# Patient Record
Sex: Male | Born: 1960 | Race: White | Hispanic: No | Smoking: Never smoker
Health system: Southern US, Community
[De-identification: ages and names within clinical notes are randomized; demographics above are authoritative.]

## PROBLEM LIST (undated history)

## (undated) DIAGNOSIS — Z9889 Other specified postprocedural states: Secondary | ICD-10-CM

## (undated) DIAGNOSIS — F99 Mental disorder, not otherwise specified: Secondary | ICD-10-CM

## (undated) DIAGNOSIS — R42 Dizziness and giddiness: Secondary | ICD-10-CM

## (undated) DIAGNOSIS — F419 Anxiety disorder, unspecified: Secondary | ICD-10-CM

## (undated) DIAGNOSIS — F32A Depression, unspecified: Secondary | ICD-10-CM

## (undated) DIAGNOSIS — M1990 Unspecified osteoarthritis, unspecified site: Secondary | ICD-10-CM

## (undated) DIAGNOSIS — R51 Headache: Secondary | ICD-10-CM

## (undated) DIAGNOSIS — J45909 Unspecified asthma, uncomplicated: Secondary | ICD-10-CM

## (undated) DIAGNOSIS — G629 Polyneuropathy, unspecified: Secondary | ICD-10-CM

## (undated) DIAGNOSIS — T07XXXA Unspecified multiple injuries, initial encounter: Secondary | ICD-10-CM

## (undated) DIAGNOSIS — I7300 Raynaud's syndrome without gangrene: Secondary | ICD-10-CM

## (undated) DIAGNOSIS — R112 Nausea with vomiting, unspecified: Secondary | ICD-10-CM

## (undated) DIAGNOSIS — F329 Major depressive disorder, single episode, unspecified: Secondary | ICD-10-CM

## (undated) HISTORY — DX: Anxiety disorder, unspecified: F41.9

## (undated) HISTORY — PX: JOINT REPLACEMENT: SHX530

---

## 1998-05-10 ENCOUNTER — Encounter: Payer: Self-pay | Admitting: Emergency Medicine

## 1998-05-10 ENCOUNTER — Emergency Department (HOSPITAL_COMMUNITY): Admission: EM | Admit: 1998-05-10 | Discharge: 1998-05-10 | Payer: Self-pay | Admitting: Emergency Medicine

## 1998-05-14 ENCOUNTER — Ambulatory Visit (HOSPITAL_COMMUNITY): Admission: RE | Admit: 1998-05-14 | Discharge: 1998-05-14 | Payer: Self-pay | Admitting: Family Medicine

## 1998-05-17 ENCOUNTER — Ambulatory Visit (HOSPITAL_COMMUNITY): Admission: RE | Admit: 1998-05-17 | Discharge: 1998-05-17 | Payer: Self-pay | Admitting: Family Medicine

## 1998-05-17 ENCOUNTER — Encounter: Payer: Self-pay | Admitting: Family Medicine

## 1998-05-29 ENCOUNTER — Ambulatory Visit (HOSPITAL_COMMUNITY): Admission: RE | Admit: 1998-05-29 | Discharge: 1998-05-29 | Payer: Self-pay | Admitting: Family Medicine

## 1998-05-29 ENCOUNTER — Encounter: Payer: Self-pay | Admitting: Family Medicine

## 1998-06-11 ENCOUNTER — Ambulatory Visit (HOSPITAL_COMMUNITY): Admission: RE | Admit: 1998-06-11 | Discharge: 1998-06-11 | Payer: Self-pay | Admitting: Family Medicine

## 1998-06-11 ENCOUNTER — Encounter: Payer: Self-pay | Admitting: Family Medicine

## 2002-02-19 ENCOUNTER — Emergency Department (HOSPITAL_COMMUNITY): Admission: EM | Admit: 2002-02-19 | Discharge: 2002-02-19 | Payer: Self-pay | Admitting: Emergency Medicine

## 2004-07-16 ENCOUNTER — Ambulatory Visit (HOSPITAL_COMMUNITY): Admission: RE | Admit: 2004-07-16 | Discharge: 2004-07-16 | Payer: Self-pay | Admitting: Gastroenterology

## 2004-07-16 ENCOUNTER — Encounter (INDEPENDENT_AMBULATORY_CARE_PROVIDER_SITE_OTHER): Payer: Self-pay | Admitting: *Deleted

## 2006-11-05 ENCOUNTER — Ambulatory Visit (HOSPITAL_BASED_OUTPATIENT_CLINIC_OR_DEPARTMENT_OTHER): Admission: RE | Admit: 2006-11-05 | Discharge: 2006-11-05 | Payer: Self-pay | Admitting: General Surgery

## 2009-07-11 ENCOUNTER — Other Ambulatory Visit: Payer: Self-pay | Admitting: Orthopedic Surgery

## 2009-07-12 ENCOUNTER — Ambulatory Visit (HOSPITAL_COMMUNITY): Admission: RE | Admit: 2009-07-12 | Discharge: 2009-07-12 | Payer: Self-pay | Admitting: Orthopedic Surgery

## 2010-09-22 LAB — URINALYSIS, ROUTINE W REFLEX MICROSCOPIC
Bilirubin Urine: NEGATIVE
Glucose, UA: NEGATIVE mg/dL
Hgb urine dipstick: NEGATIVE
Ketones, ur: NEGATIVE mg/dL
Nitrite: NEGATIVE
Protein, ur: NEGATIVE mg/dL
Specific Gravity, Urine: 1.011 (ref 1.005–1.030)
Urobilinogen, UA: 0.2 mg/dL (ref 0.0–1.0)
pH: 6.5 (ref 5.0–8.0)

## 2010-09-22 LAB — CBC
HCT: 47.5 % (ref 39.0–52.0)
Hemoglobin: 16.1 g/dL (ref 13.0–17.0)
MCHC: 33.9 g/dL (ref 30.0–36.0)
MCV: 88.9 fL (ref 78.0–100.0)
Platelets: 171 10*3/uL (ref 150–400)
RBC: 5.34 MIL/uL (ref 4.22–5.81)
RDW: 13.2 % (ref 11.5–15.5)
WBC: 4.4 10*3/uL (ref 4.0–10.5)

## 2010-09-22 LAB — COMPREHENSIVE METABOLIC PANEL
ALT: 17 U/L (ref 0–53)
Calcium: 9.2 mg/dL (ref 8.4–10.5)
Glucose, Bld: 100 mg/dL — ABNORMAL HIGH (ref 70–99)
Sodium: 141 mEq/L (ref 135–145)
Total Protein: 6.8 g/dL (ref 6.0–8.3)

## 2010-09-22 LAB — PROTIME-INR
INR: 1 (ref 0.00–1.49)
Prothrombin Time: 13.1 seconds (ref 11.6–15.2)

## 2010-09-22 LAB — POCT HEMOGLOBIN-HEMACUE: Hemoglobin: 17.4 g/dL — ABNORMAL HIGH (ref 13.0–17.0)

## 2010-11-22 NOTE — Op Note (Signed)
NAME:  Johnny [name], Johnny [name]                 ACCOUNT NO.:  [id number]   MEDICAL RECORD NO.:  [id number]          PATIENT TYPE:  AMB   LOCATION:  [facility]                         FACILITY:  [facility]   PHYSICIAN:  Timothy E. Earlene Plater, M.D. DATE OF BIRTH:  October 03, 1960   DATE OF PROCEDURE:  11/05/2006  DATE OF DISCHARGE:                               OPERATIVE REPORT   PREOPERATIVE DIAGNOSIS:  Right inguinal hernia.   POSTOPERATIVE DIAGNOSIS:  Right direct inguinal hernia.   OPERATIVE PROCEDURE:  Repair with mesh.   SURGEON:  Timothy E. Earlene Plater, M.D.   ANESTHESIA:  General.   Johnny [name] has a symptomatic right inguinal hernia and wishes to have  it repaired.  He has been carefully counseled and he is ready to  proceed.  The patient's main risk is obesity.  The patient was seen,  identified, the right groin marked.  He was taken to the operating room,  placed supine.  General endotracheal anesthesia administered.  The groin  was identified on the right side and clipped and prepped and draped in  the usual fashion.  Marcaine 0.25% with epinephrine was used throughout  for field block anesthesia.  An incision was made horizontally over the  palpable defect.  A very thick layer of deep subcutaneous fatty tissue  was encountered.  This was dissected down to the external oblique  fascia.  This was cleared off.  More Marcaine was used.  The external  oblique was opened in line with its fibers through the external ring,  care taken to avoid the ilioinguinal nerve which was preserved.  The  cord structures were dissected off the pubic tubercle and reflected  laterally.  A large direct hernia pouched through the floor of the  inguinal canal.  This was separated from the cord structures and  reduced.  This encompassed the entire floor of the inguinal canal.  A  plug of polypropylene mesh was placed there and sutured to the  surrounding normal fascia.  Then a patch of Proceed mesh was fashioned  to fit the entire  floor and was sewn to the floor with 2-0 Prolene  between the lateral a edge of the inguinal ligament and medially.  This  was accomplished up to and around the internal ring.  The cord  structures passed normally and the tails of the mesh wrapped around the  internal oblique superior to the cord exit.  This was a good repair and  solid.  The cord was replaced in its anatomic position.  The external  oblique was closed with a running 2-0 Vicryl, subcu with 2-0 Vicryl and  skin with 3-0 Monocryl.  All counts correct.  He tolerated it well, was  awakened and taken to the recovery room in good condition.   Written and verbal instructions given to him and his wife along with  Percocet 5 mg #36.  He will be followed in the office.      Timothy E. Earlene Plater, M.D.  Electronically Signed     TED/MEDQ  D:  11/05/2006  T:  11/05/2006  Job:  [id number]

## 2010-11-22 NOTE — Op Note (Signed)
NAME:  Johnny [name], Johnny [name]                 ACCOUNT NO.:  [id number]   MEDICAL RECORD NO.:  [id number]          PATIENT TYPE:  AMB   LOCATION:  ENDO                         FACILITY:  [facility]   PHYSICIAN:  Petra Kuba, M.D.    DATE OF BIRTH:  02-16-1961   DATE OF PROCEDURE:  07/16/2004  DATE OF DISCHARGE:                                 OPERATIVE REPORT   PROCEDURE PERFORMED:  Colonoscopy with biopsy.   INDICATIONS:  The patient with change in bowel habits, increased  constipation.  Consent was signed after risks and benefits, methods and  options thoroughly discussed in the office.   MEDICINES USED:  Demerol 80, Versed 8.   PROCEDURE:  Rectal inspection is pertinent for internal hemorrhoids, small.  Digital exam is negative.  Video colonoscope was inserted and fairly easily  advanced around the colon to the cecum.  This did require some abdominal  pressure but no position changes.  The cecum was identified by the  appendiceal orifice and the ileocecal valve.  On insertion, some left-sided  diverticula were seen but no other abnormalities.  The scope was inserted a  short ways in the terminal ileum which was normal.  Photo documentation was  obtained.  Because of his gastrointestinal infection, possibly picked up in  [place] years ago, we went ahead and took a few terminal ileum biopsies, put  them in the first container and a few random biopsies were obtained and put  in the second container.  On slow withdrawal through the colon, the prep was  adequate.  There was some liquid stool that required washing and suctioning  however, other than the left-sided moderate diverticula, no additional  findings were seen.  Once back in the rectum, anorectal pull through and  retroflexion confirms some small hemorrhoids.  The scope was straightened  and re-advanced a short ways up the left side of the colon.  Air was  suctioned, scope removed.  The patient tolerated the procedure well.  There  was no  obvious immediate complication.   ENDOSCOPIC DIAGNOSES:  1.  Internal and external small hemorrhoids.  2.  Left moderate diverticula.  3.  Otherwise within normal limits to the terminal ileum with random      biopsies throughout there and colon.   PLAN:  Await pathology, follow up p.r.n. or in two months to recheck  symptoms and make sure no further work up plans are needed.  Probably if  constipation recurs, trial of MiraLax and getting laboratory work to rule  out thyroid and other etiologies.      MEM/MEDQ  D:  07/16/2004  T:  07/16/2004  Job:  [id number]   cc:   Molly Maduro A. Nicholos Johns, M.D.  [address]  [place]  [place] [address]  Fax: [phone]

## 2011-01-29 ENCOUNTER — Emergency Department (HOSPITAL_COMMUNITY): Payer: BC Managed Care – PPO

## 2011-01-29 ENCOUNTER — Emergency Department (HOSPITAL_COMMUNITY)
Admission: EM | Admit: 2011-01-29 | Discharge: 2011-01-30 | Disposition: A | Discharge status: Home / Self Care | Payer: BC Managed Care – PPO | Attending: Emergency Medicine | Admitting: Emergency Medicine

## 2011-01-29 DIAGNOSIS — K2990 Gastroduodenitis, unspecified, without bleeding: Secondary | ICD-10-CM | POA: Insufficient documentation

## 2011-01-29 DIAGNOSIS — K2970 Gastritis, unspecified, without bleeding: Secondary | ICD-10-CM | POA: Insufficient documentation

## 2011-01-29 DIAGNOSIS — R10819 Abdominal tenderness, unspecified site: Secondary | ICD-10-CM | POA: Insufficient documentation

## 2011-01-29 DIAGNOSIS — R079 Chest pain, unspecified: Secondary | ICD-10-CM | POA: Insufficient documentation

## 2011-01-29 LAB — CBC
Hemoglobin: 16.1 g/dL (ref 13.0–17.0)
RBC: 5.62 MIL/uL (ref 4.22–5.81)
WBC: 4.7 10*3/uL (ref 4.0–10.5)

## 2011-01-29 LAB — LIPASE, BLOOD: Lipase: 22 U/L (ref 11–59)

## 2011-01-29 LAB — CK TOTAL AND CKMB (NOT AT ARMC)
CK, MB: 1.6 ng/mL (ref 0.3–4.0)
Relative Index: 1.5 (ref 0.0–2.5)

## 2011-01-29 LAB — COMPREHENSIVE METABOLIC PANEL
ALT: 17 U/L (ref 0–53)
Alkaline Phosphatase: 51 U/L (ref 39–117)
CO2: 23 mEq/L (ref 19–32)
Chloride: 95 mEq/L — ABNORMAL LOW (ref 96–112)
GFR calc Af Amer: >60 mL/min (ref >60)
GFR calc non Af Amer: >60 mL/min (ref >60)
Glucose, Bld: 105 mg/dL — ABNORMAL HIGH (ref 70–99)
Potassium: 3.5 mEq/L (ref 3.5–5.1)
Sodium: 130 mEq/L — ABNORMAL LOW (ref 135–145)

## 2011-01-29 LAB — DIFFERENTIAL
Basophils Absolute: 0 10*3/uL (ref 0.0–0.1)
Basophils Relative: 0 % (ref 0–1)
Monocytes Relative: 10 % (ref 3–12)
Neutro Abs: 3.1 10*3/uL (ref 1.7–7.7)
Neutrophils Relative %: 66 % (ref 43–77)

## 2011-01-29 LAB — TROPONIN I: Troponin I: <0.3 ng/mL (ref <0.30)

## 2011-07-07 ENCOUNTER — Other Ambulatory Visit: Payer: Self-pay | Admitting: Dermatology

## 2012-02-05 ENCOUNTER — Other Ambulatory Visit: Payer: Self-pay | Admitting: Orthopedic Surgery

## 2012-02-05 MED ORDER — BUPIVACAINE 0.25 % ON-Q PUMP SINGLE CATH 300ML: 300.0000 mL | INJECTION | Status: DC

## 2012-02-05 MED ORDER — DEXAMETHASONE SODIUM PHOSPHATE 10 MG/ML IJ SOLN: 10.0000 mg | Freq: Once | INTRAMUSCULAR | Status: DC

## 2012-02-05 NOTE — Progress Notes (Signed)
Preoperative surgical orders have been place into the Epic hospital system for Johnny [name] on 02/05/2012, 9:58 PM  by Patrica Duel for surgery on 02/20/12.  Preop Total Knee orders including Bupivacaine On-Q pump, IV Tylenol, and IV Decadron as long as there are no contraindications to the above medications. [name], PA-C

## 2012-02-06 ENCOUNTER — Encounter (HOSPITAL_COMMUNITY): Payer: Self-pay | Admitting: Pharmacy Technician

## 2012-02-09 ENCOUNTER — Encounter (HOSPITAL_COMMUNITY)
Admission: RE | Admit: 2012-02-09 | Discharge: 2012-02-09 | Disposition: A | Discharge status: Home / Self Care | Payer: BC Managed Care – PPO | Source: Ambulatory Visit | Attending: Orthopedic Surgery | Admitting: Orthopedic Surgery

## 2012-02-09 ENCOUNTER — Encounter (HOSPITAL_COMMUNITY): Payer: Self-pay

## 2012-02-09 DIAGNOSIS — R42 Dizziness and giddiness: Secondary | ICD-10-CM

## 2012-02-09 DIAGNOSIS — M1990 Unspecified osteoarthritis, unspecified site: Secondary | ICD-10-CM

## 2012-02-09 DIAGNOSIS — F99 Mental disorder, not otherwise specified: Secondary | ICD-10-CM

## 2012-02-09 DIAGNOSIS — I7300 Raynaud's syndrome without gangrene: Secondary | ICD-10-CM

## 2012-02-09 DIAGNOSIS — T07XXXA Unspecified multiple injuries, initial encounter: Secondary | ICD-10-CM

## 2012-02-09 DIAGNOSIS — J45909 Unspecified asthma, uncomplicated: Secondary | ICD-10-CM

## 2012-02-09 HISTORY — DX: Unspecified osteoarthritis, unspecified site: M19.90

## 2012-02-09 HISTORY — DX: Raynaud's syndrome without gangrene: I73.00

## 2012-02-09 HISTORY — DX: Major depressive disorder, single episode, unspecified: F32.9

## 2012-02-09 HISTORY — PX: HERNIA REPAIR: SHX51

## 2012-02-09 HISTORY — DX: Depression, unspecified: F32.A

## 2012-02-09 HISTORY — DX: Dizziness and giddiness: R42

## 2012-02-09 HISTORY — DX: Unspecified multiple injuries, initial encounter: T07.XXXA

## 2012-02-09 HISTORY — PX: GANGLION CYST EXCISION: SHX1691

## 2012-02-09 HISTORY — DX: Mental disorder, not otherwise specified: F99

## 2012-02-09 HISTORY — PX: KNEE ARTHROSCOPY: SHX127

## 2012-02-09 HISTORY — DX: Unspecified asthma, uncomplicated: J45.909

## 2012-02-09 LAB — COMPREHENSIVE METABOLIC PANEL
AST: 14 U/L (ref 0–37)
Albumin: 4.1 g/dL (ref 3.5–5.2)
Chloride: 97 mEq/L (ref 96–112)
Creatinine, Ser: 0.97 mg/dL (ref 0.50–1.35)
Potassium: 3.5 mEq/L (ref 3.5–5.1)
Total Bilirubin: 0.3 mg/dL (ref 0.3–1.2)

## 2012-02-09 LAB — URINALYSIS, ROUTINE W REFLEX MICROSCOPIC
Bilirubin Urine: NEGATIVE
Leukocytes, UA: NEGATIVE
Nitrite: NEGATIVE
Specific Gravity, Urine: 1.027 (ref 1.005–1.030)
pH: 6 (ref 5.0–8.0)

## 2012-02-09 LAB — PROTIME-INR: INR: 1 (ref 0.00–1.49)

## 2012-02-09 LAB — CBC
MCV: 85.5 fL (ref 78.0–100.0)
Platelets: 167 10*3/uL (ref 150–400)
RDW: 13 % (ref 11.5–15.5)
WBC: 3.9 10*3/uL — ABNORMAL LOW (ref 4.0–10.5)

## 2012-02-09 LAB — APTT: aPTT: 36 seconds (ref 24–37)

## 2012-02-09 NOTE — Patient Instructions (Addendum)
20 RAINEY KAHRS  02/09/2012   Your procedure is scheduled on:  8-16 -2013  Report to [facility] at     1000   AM               Call this number if you have problems the morning of surgery: [phone] or Presurgical Testing [phone] prior to   Remember:   Do not eat food:After Midnight.  May have clear liquids:up to 6 Hours before arrival. Nothing after : 0600  Clear liquids include soda, tea, black coffee, apple or grape juice, broth.  Take these medicines the morning of surgery with A SIP OF WATER: Clonazepam, Lamictal, Trileptal, Setraline   Do not wear jewelry, make-up or nail polish.  Do not wear lotions, powders, or perfumes. You may wear deodorant.  Do not shave 48 hours prior to surgery.(face and neck okay, no shaving of legs)  Do not bring valuables to the hospital.  Contacts, dentures or bridgework may not be worn into surgery.  Leave suitcase in the car. After surgery it may be brought to your room.  For patients admitted to the hospital, checkout time is 11:00 AM the day of discharge.   Patients discharged the day of surgery will not be allowed to drive home.  Name and phone number of your driver: spouse  Special Instructions: CHG Shower Use Special Wash: 1/2 bottle night before surgery and 1/2 bottle morning of surgery.(avoid face and genitals)   Please read over the following fact sheets that you were given: MRSA Information, Blood Transfusion fact sheet, Incentive Spirometry Instruction.

## 2012-02-09 NOTE — Pre-Procedure Instructions (Signed)
02-09-12 Teach back method used. EKG done today

## 2012-02-11 ENCOUNTER — Other Ambulatory Visit (HOSPITAL_COMMUNITY): Payer: BC Managed Care – PPO

## 2012-02-15 ENCOUNTER — Other Ambulatory Visit: Payer: Self-pay | Admitting: Orthopedic Surgery

## 2012-02-15 NOTE — H&P (Signed)
Johnny [name]  DOB: 1960/10/19 Married / Language: English / Race: White / Male  Date of Admission:  02/20/2012  Chief complaint:  Right Knee Pain  History of Present Illness The patient is a 51 year old male who comes in for a preoperative History and Physical. The patient is scheduled for a right total knee arthroplasty to be performed by Dr. Gus Rankin. [name], MD at [facility] on 02/20/2012. The patient is a 51 year old male who presents today for follow up of their knee. The patient is being followed for their right knee pain and osteoarthritis. The patient has not gotten any relief of their symptoms with viscosupplementation. Note for "Follow-up Knee": His knee is getting worse, eventhough he has been icing it twice a day. The Synvisc series did not help him. The continues to buckle on him. [name] is at a stage now where the knee is hurting him at all times. It is limiting what he can and cannot do. He is at a stage where he wants to get this fixed. He is tired of the pain and dysfunction. He does have pain at night also. They have been treated conservatively in the past for the above stated problem and despite conservative measures, they continue to have progressive pain and severe functional limitations and dysfunction. They have failed non-operative management. It is felt that they would benefit from undergoing total joint replacement. Risks and benefits of the procedure have been discussed with the patient and they elect to proceed with surgery. There are no active contraindications to surgery such as ongoing infection or rapidly progressive neurological disease.   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 Type 2 Depression. History of Chronic Bronchitis. History Of Pneumonia.  History Of Vertigo Alcoholism Measles Mumps   Allergies No Known Drug Allergies. 09/18/2011   Family History Depression. mother Hypertension. mother Other medical problems. Son [name] has Asperger syndrome and mild MR Father. Deceased, Lung Cancer.   Social History Drug/Alcohol Rehab (Currently). no Alcohol use. former drinker Marital status. married Living situation. live with spouse Children. 2 Current work status. working full time Previously in rehab. yes Pain Contract. no Exercise. Exercises rarely; does running / walking Illicit drug use. yes Tobacco use. 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 greater than 5 Tobacco / smoke exposure. no Advance Directives. Healthcare POA Post-Surgical Plans. Plan is to go home.   Medication History ALPRAZolam (0.5MG  Tablet, Oral) Active. LamoTRIgine (150MG  Tablet, Oral) Active. ClonazePAM (0.5MG  Tablet, Oral) Active. SEROquel XR (300MG  Tablet ER 24HR, Oral) Active. Sertraline HCl (50MG  Tablet, Oral) Active. Vitamin D (Ergocalciferol) (50000UNIT Capsule, Oral) Active.   Past Surgical History Inguinal Hernia Repair. open: right   Vitals Weight: 280 lb Height: 70.5 in Body Surface Area: 2.51 m Body Mass Index: 39.61 kg/m Pulse: 76 (Regular) Resp.: 12 (Unlabored) BP: 124/72 (Sitting, Right Arm, Standard)    Physical Exam The physical exam findings are as follows:   General Mental Status - Alert, cooperative and good historian. General Appearance- pleasant. Not in acute distress. Orientation- Oriented X3. Build & Nutrition- Well nourished and Well developed.   Head and Neck Head- normocephalic, atraumatic . Neck Global Assessment- supple. no bruit auscultated on the right and no bruit auscultated on the left.   Eye Pupil- Bilateral- Regular and  Round. Motion- Bilateral-  EOMI. wears glasses  Chest and Lung Exam Auscultation: Breath sounds:- clear at anterior chest wall and - clear at posterior chest wall. Adventitious sounds:- No Adventitious sounds.   Cardiovascular Auscultation:Rhythm- Regular rate and rhythm. Heart Sounds- S1 WNL and S2 WNL. Murmurs & Other Heart Sounds:Auscultation of the heart reveals - No Murmurs.   Abdomen Inspection:Contour- Generalized moderate distention. Palpation/Percussion:Tenderness- Abdomen is non-tender to palpation. Rigidity (guarding)- Abdomen is soft. Auscultation:Auscultation of the abdomen reveals - Bowel sounds normal.   Male Genitourinary Not done, not pertinent to present illness  Musculoskeletal On exam, he is alert and oriented in no apparent distress. His right knee shows very mild swelling. His range is about 5 to 120 with marked crepitus on range of motion. Tenderness medial greater than lateral with no instability.  Assessment & Plan Osteoarthritis, Knee (715.96) Impression: Right Knee  Note: Patient is for a Right Total Knee Repalcement by Dr. Lequita Halt.  Plan is to go home.  Dr. Alvino Chapel  Signed electronically by Roberts Gaudy, PA-C

## 2012-02-20 ENCOUNTER — Encounter (HOSPITAL_COMMUNITY): Payer: Self-pay | Admitting: Anesthesiology

## 2012-02-20 ENCOUNTER — Inpatient Hospital Stay (HOSPITAL_COMMUNITY)
Admission: RE | Admit: 2012-02-20 | Discharge: 2012-02-23 | DRG: 209 | Disposition: A | Discharge status: Home Health | Payer: BC Managed Care – PPO | Source: Ambulatory Visit | Attending: Orthopedic Surgery | Admitting: Orthopedic Surgery

## 2012-02-20 ENCOUNTER — Ambulatory Visit (HOSPITAL_COMMUNITY): Payer: BC Managed Care – PPO | Admitting: Anesthesiology

## 2012-02-20 ENCOUNTER — Encounter (HOSPITAL_COMMUNITY): Payer: Self-pay | Admitting: *Deleted

## 2012-02-20 ENCOUNTER — Encounter (HOSPITAL_COMMUNITY)
Admission: RE | Disposition: A | Discharge status: Home Health | Payer: Self-pay | Source: Ambulatory Visit | Attending: Orthopedic Surgery

## 2012-02-20 DIAGNOSIS — F411 Generalized anxiety disorder: Secondary | ICD-10-CM | POA: Diagnosis present

## 2012-02-20 DIAGNOSIS — F3189 Other bipolar disorder: Secondary | ICD-10-CM | POA: Diagnosis present

## 2012-02-20 DIAGNOSIS — M1710 Unilateral primary osteoarthritis, unspecified knee: Secondary | ICD-10-CM | POA: Diagnosis present

## 2012-02-20 DIAGNOSIS — I739 Peripheral vascular disease, unspecified: Secondary | ICD-10-CM | POA: Diagnosis present

## 2012-02-20 DIAGNOSIS — Z6841 Body Mass Index (BMI) 40.0 and over, adult: Secondary | ICD-10-CM

## 2012-02-20 DIAGNOSIS — I7300 Raynaud's syndrome without gangrene: Secondary | ICD-10-CM | POA: Diagnosis present

## 2012-02-20 DIAGNOSIS — Z01810 Encounter for preprocedural cardiovascular examination: Secondary | ICD-10-CM

## 2012-02-20 DIAGNOSIS — IMO0002 Reserved for concepts with insufficient information to code with codable children: Principal | ICD-10-CM | POA: Diagnosis present

## 2012-02-20 DIAGNOSIS — K219 Gastro-esophageal reflux disease without esophagitis: Secondary | ICD-10-CM | POA: Diagnosis present

## 2012-02-20 DIAGNOSIS — Z01812 Encounter for preprocedural laboratory examination: Secondary | ICD-10-CM

## 2012-02-20 DIAGNOSIS — M25569 Pain in unspecified knee: Secondary | ICD-10-CM | POA: Diagnosis present

## 2012-02-20 HISTORY — PX: TOTAL KNEE ARTHROPLASTY: SHX125

## 2012-02-20 LAB — TYPE AND SCREEN: ABO/RH(D): A POS

## 2012-02-20 LAB — ABO/RH: ABO/RH(D): A POS

## 2012-02-20 SURGERY — ARTHROPLASTY, KNEE, TOTAL
Anesthesia: Spinal | Site: Knee | Laterality: Right | Wound class: Clean

## 2012-02-20 MED ORDER — BUPIVACAINE ON-Q PAIN PUMP (FOR ORDER SET NO CHG)
INJECTION | Status: DC
  Filled 2012-02-20: qty 1

## 2012-02-20 MED ORDER — ONDANSETRON HCL 4 MG PO TABS: 4.0000 mg | ORAL_TABLET | Freq: Four times a day (QID) | ORAL | Status: DC | PRN

## 2012-02-20 MED ORDER — SODIUM CHLORIDE 0.9 % IR SOLN
Status: DC | PRN
  Administered 2012-02-20: 1000 mL

## 2012-02-20 MED ORDER — ONDANSETRON HCL 4 MG/2ML IJ SOLN: 4.0000 mg | Freq: Four times a day (QID) | INTRAMUSCULAR | Status: DC | PRN

## 2012-02-20 MED ORDER — POLYETHYLENE GLYCOL 3350 17 G PO PACK: 17.0000 g | PACK | Freq: Every day | ORAL | Status: DC | PRN

## 2012-02-20 MED ORDER — PROMETHAZINE HCL 25 MG/ML IJ SOLN: 6.2500 mg | INTRAMUSCULAR | Status: DC | PRN

## 2012-02-20 MED ORDER — LAMOTRIGINE 150 MG PO TABS
150.0000 mg | ORAL_TABLET | Freq: Four times a day (QID) | ORAL | Status: DC
  Administered 2012-02-20 – 2012-02-23 (×11): 150 mg via ORAL
  Filled 2012-02-20 (×16): qty 1

## 2012-02-20 MED ORDER — PROPOFOL 10 MG/ML IV EMUL
INTRAVENOUS | Status: DC | PRN
  Administered 2012-02-20: 140 ug/kg/min via INTRAVENOUS

## 2012-02-20 MED ORDER — BUPIVACAINE 0.25 % ON-Q PUMP SINGLE CATH 300ML
INJECTION | Status: AC
  Filled 2012-02-20: qty 300

## 2012-02-20 MED ORDER — CEFAZOLIN SODIUM 1-5 GM-% IV SOLN
1.0000 g | Freq: Four times a day (QID) | INTRAVENOUS | Status: AC
  Administered 2012-02-20 – 2012-02-21 (×2): 1 g via INTRAVENOUS
  Filled 2012-02-20 (×2): qty 50

## 2012-02-20 MED ORDER — CLONAZEPAM 0.5 MG PO TABS: 0.5000 mg | ORAL_TABLET | ORAL | Status: DC

## 2012-02-20 MED ORDER — SERTRALINE HCL 50 MG PO TABS
50.0000 mg | ORAL_TABLET | Freq: Every day | ORAL | Status: DC
  Administered 2012-02-21: 50 mg via ORAL
  Filled 2012-02-20 (×2): qty 1

## 2012-02-20 MED ORDER — OXCARBAZEPINE 300 MG PO TABS
450.0000 mg | ORAL_TABLET | Freq: Two times a day (BID) | ORAL | Status: DC
  Administered 2012-02-20: 450 mg via ORAL
  Filled 2012-02-20 (×3): qty 1

## 2012-02-20 MED ORDER — SODIUM CHLORIDE 0.9 % IV SOLN: INTRAVENOUS | Status: DC

## 2012-02-20 MED ORDER — METHOCARBAMOL 100 MG/ML IJ SOLN
500.0000 mg | Freq: Four times a day (QID) | INTRAVENOUS | Status: DC | PRN
  Administered 2012-02-20 (×2): 500 mg via INTRAVENOUS
  Filled 2012-02-20 (×2): qty 5

## 2012-02-20 MED ORDER — CEFAZOLIN SODIUM-DEXTROSE 2-3 GM-% IV SOLR
INTRAVENOUS | Status: AC
  Filled 2012-02-20: qty 50

## 2012-02-20 MED ORDER — DOCUSATE SODIUM 100 MG PO CAPS
100.0000 mg | ORAL_CAPSULE | Freq: Two times a day (BID) | ORAL | Status: DC
  Administered 2012-02-20 – 2012-02-23 (×6): 100 mg via ORAL

## 2012-02-20 MED ORDER — BUPIVACAINE IN DEXTROSE 0.75-8.25 % IT SOLN
INTRATHECAL | Status: DC | PRN
  Administered 2012-02-20: 1.8 mL via INTRATHECAL

## 2012-02-20 MED ORDER — ACETAMINOPHEN 325 MG PO TABS
650.0000 mg | ORAL_TABLET | Freq: Four times a day (QID) | ORAL | Status: DC | PRN
  Administered 2012-02-21: 650 mg via ORAL
  Filled 2012-02-20: qty 2

## 2012-02-20 MED ORDER — DIPHENHYDRAMINE HCL 50 MG/ML IJ SOLN: 12.5000 mg | Freq: Four times a day (QID) | INTRAMUSCULAR | Status: DC | PRN

## 2012-02-20 MED ORDER — 0.9 % SODIUM CHLORIDE (POUR BTL) OPTIME
TOPICAL | Status: DC | PRN
  Administered 2012-02-20: 1000 mL

## 2012-02-20 MED ORDER — BUPIVACAINE 0.25 % ON-Q PUMP SINGLE CATH 100 ML
INJECTION | Status: DC | PRN
  Administered 2012-02-20: 300 mL

## 2012-02-20 MED ORDER — ACETAMINOPHEN 10 MG/ML IV SOLN
INTRAVENOUS | Status: AC
  Filled 2012-02-20: qty 100

## 2012-02-20 MED ORDER — DEXTROSE 5 % IV SOLN
3.0000 g | INTRAVENOUS | Status: AC
  Administered 2012-02-20: 3 g via INTRAVENOUS

## 2012-02-20 MED ORDER — BISACODYL 10 MG RE SUPP: 10.0000 mg | Freq: Every day | RECTAL | Status: DC | PRN

## 2012-02-20 MED ORDER — ACETAMINOPHEN 10 MG/ML IV SOLN
INTRAVENOUS | Status: DC | PRN
  Administered 2012-02-20: 1000 mg via INTRAVENOUS

## 2012-02-20 MED ORDER — PROPOFOL 10 MG/ML IV BOLUS
INTRAVENOUS | Status: DC | PRN
  Administered 2012-02-20: 40 mg via INTRAVENOUS

## 2012-02-20 MED ORDER — FLEET ENEMA 7-19 GM/118ML RE ENEM: 1.0000 | ENEMA | Freq: Once | RECTAL | Status: AC | PRN

## 2012-02-20 MED ORDER — ACETAMINOPHEN 650 MG RE SUPP: 650.0000 mg | Freq: Four times a day (QID) | RECTAL | Status: DC | PRN

## 2012-02-20 MED ORDER — MENTHOL 3 MG MT LOZG
1.0000 | LOZENGE | OROMUCOSAL | Status: DC | PRN
  Filled 2012-02-20: qty 9

## 2012-02-20 MED ORDER — MORPHINE SULFATE (PF) 1 MG/ML IV SOLN
INTRAVENOUS | Status: DC
  Administered 2012-02-20: 1 mg via INTRAVENOUS
  Administered 2012-02-20: 27 mg via INTRAVENOUS
  Administered 2012-02-21: 19 mg via INTRAVENOUS
  Administered 2012-02-21: 11 mg via INTRAVENOUS
  Filled 2012-02-20 (×2): qty 25

## 2012-02-20 MED ORDER — FENTANYL CITRATE 0.05 MG/ML IJ SOLN
INTRAMUSCULAR | Status: DC | PRN
  Administered 2012-02-20: 100 ug via INTRAVENOUS

## 2012-02-20 MED ORDER — RIVAROXABAN 10 MG PO TABS
10.0000 mg | ORAL_TABLET | Freq: Every day | ORAL | Status: DC
  Administered 2012-02-21 – 2012-02-23 (×3): 10 mg via ORAL
  Filled 2012-02-20 (×4): qty 1

## 2012-02-20 MED ORDER — DIPHENHYDRAMINE HCL 12.5 MG/5ML PO ELIX: 12.5000 mg | ORAL_SOLUTION | Freq: Four times a day (QID) | ORAL | Status: DC | PRN

## 2012-02-20 MED ORDER — HYDROMORPHONE HCL PF 1 MG/ML IJ SOLN: 0.2500 mg | INTRAMUSCULAR | Status: DC | PRN

## 2012-02-20 MED ORDER — POTASSIUM CHLORIDE IN NACL 20-0.9 MEQ/L-% IV SOLN
INTRAVENOUS | Status: DC
  Administered 2012-02-20 – 2012-02-22 (×3): via INTRAVENOUS
  Filled 2012-02-20 (×8): qty 1000

## 2012-02-20 MED ORDER — METOCLOPRAMIDE HCL 10 MG PO TABS: 5.0000 mg | ORAL_TABLET | Freq: Three times a day (TID) | ORAL | Status: DC | PRN

## 2012-02-20 MED ORDER — MIDAZOLAM HCL 5 MG/5ML IJ SOLN
INTRAMUSCULAR | Status: DC | PRN
  Administered 2012-02-20: 2 mg via INTRAVENOUS

## 2012-02-20 MED ORDER — MORPHINE SULFATE (PF) 1 MG/ML IV SOLN
INTRAVENOUS | Status: AC
  Administered 2012-02-20: 1 mg via INTRAVENOUS
  Filled 2012-02-20: qty 25

## 2012-02-20 MED ORDER — SODIUM CHLORIDE 0.9 % IJ SOLN: 9.0000 mL | INTRAMUSCULAR | Status: DC | PRN

## 2012-02-20 MED ORDER — ACETAMINOPHEN 10 MG/ML IV SOLN
1000.0000 mg | Freq: Four times a day (QID) | INTRAVENOUS | Status: AC
  Administered 2012-02-20 – 2012-02-21 (×4): 1000 mg via INTRAVENOUS
  Filled 2012-02-20 (×5): qty 100

## 2012-02-20 MED ORDER — CEFAZOLIN SODIUM 1-5 GM-% IV SOLN
INTRAVENOUS | Status: AC
  Filled 2012-02-20: qty 50

## 2012-02-20 MED ORDER — OXYCODONE HCL 5 MG PO TABS
5.0000 mg | ORAL_TABLET | ORAL | Status: DC | PRN
  Administered 2012-02-20 – 2012-02-22 (×6): 10 mg via ORAL
  Administered 2012-02-22: 5 mg via ORAL
  Administered 2012-02-22 – 2012-02-23 (×3): 10 mg via ORAL
  Administered 2012-02-23: 5 mg via ORAL
  Filled 2012-02-20 (×2): qty 2
  Filled 2012-02-20: qty 1
  Filled 2012-02-20 (×8): qty 2

## 2012-02-20 MED ORDER — DIPHENHYDRAMINE HCL 12.5 MG/5ML PO ELIX: 12.5000 mg | ORAL_SOLUTION | ORAL | Status: DC | PRN

## 2012-02-20 MED ORDER — QUETIAPINE FUMARATE ER 300 MG PO TB24
600.0000 mg | ORAL_TABLET | Freq: Every day | ORAL | Status: DC
  Administered 2012-02-20 – 2012-02-22 (×3): 600 mg via ORAL
  Filled 2012-02-20 (×4): qty 2

## 2012-02-20 MED ORDER — ACETAMINOPHEN 10 MG/ML IV SOLN: 1000.0000 mg | Freq: Once | INTRAVENOUS | Status: DC

## 2012-02-20 MED ORDER — LACTATED RINGERS IV SOLN
INTRAVENOUS | Status: DC
  Administered 2012-02-20: 14:00:00 via INTRAVENOUS
  Administered 2012-02-20: 1000 mL via INTRAVENOUS
  Administered 2012-02-20: 13:00:00 via INTRAVENOUS

## 2012-02-20 MED ORDER — CLONAZEPAM 0.5 MG PO TABS
0.5000 mg | ORAL_TABLET | Freq: Every day | ORAL | Status: DC
  Administered 2012-02-21: 0.5 mg via ORAL
  Filled 2012-02-20: qty 1

## 2012-02-20 MED ORDER — CLONAZEPAM 1 MG PO TABS
1.0000 mg | ORAL_TABLET | Freq: Every day | ORAL | Status: DC
  Administered 2012-02-20: 1 mg via ORAL
  Filled 2012-02-20: qty 1

## 2012-02-20 MED ORDER — PHENOL 1.4 % MT LIQD
1.0000 | OROMUCOSAL | Status: DC | PRN
  Filled 2012-02-20: qty 177

## 2012-02-20 MED ORDER — METHOCARBAMOL 500 MG PO TABS
500.0000 mg | ORAL_TABLET | Freq: Four times a day (QID) | ORAL | Status: DC | PRN
  Administered 2012-02-21 – 2012-02-22 (×3): 500 mg via ORAL
  Filled 2012-02-20 (×4): qty 1

## 2012-02-20 MED ORDER — NALOXONE HCL 0.4 MG/ML IJ SOLN: 0.4000 mg | INTRAMUSCULAR | Status: DC | PRN

## 2012-02-20 MED ORDER — METOCLOPRAMIDE HCL 5 MG/ML IJ SOLN: 5.0000 mg | Freq: Three times a day (TID) | INTRAMUSCULAR | Status: DC | PRN

## 2012-02-20 MED ORDER — CHLORHEXIDINE GLUCONATE 4 % EX LIQD: 60.0000 mL | Freq: Once | CUTANEOUS | Status: DC

## 2012-02-20 SURGICAL SUPPLY — 51 items
BAG ZIPLOCK 12X15 (MISCELLANEOUS) ×2 IMPLANT
BANDAGE ELASTIC 6 VELCRO ST LF (GAUZE/BANDAGES/DRESSINGS) ×2 IMPLANT
BANDAGE ESMARK 6X9 LF (GAUZE/BANDAGES/DRESSINGS) ×1 IMPLANT
BLADE SAG 18X100X1.27 (BLADE) ×2 IMPLANT
BLADE SAW SGTL 11.0X1.19X90.0M (BLADE) ×2 IMPLANT
BNDG ESMARK 6X9 LF (GAUZE/BANDAGES/DRESSINGS) ×2
BOWL SMART MIX CTS (DISPOSABLE) ×2 IMPLANT
CATH KIT ON-Q SILVERSOAK 5IN (CATHETERS) ×2 IMPLANT
CEMENT HV SMART SET (Cement) ×4 IMPLANT
CLOTH BEACON ORANGE TIMEOUT ST (SAFETY) ×2 IMPLANT
CUFF TOURN SGL QUICK 34 (TOURNIQUET CUFF) ×1
CUFF TRNQT CYL 34X4X40X1 (TOURNIQUET CUFF) ×1 IMPLANT
DRAPE EXTREMITY T 121X128X90 (DRAPE) ×2 IMPLANT
DRAPE POUCH INSTRU U-SHP 10X18 (DRAPES) ×2 IMPLANT
DRAPE U-SHAPE 47X51 STRL (DRAPES) ×2 IMPLANT
DRSG ADAPTIC 3X8 NADH LF (GAUZE/BANDAGES/DRESSINGS) ×2 IMPLANT
DRSG PAD ABDOMINAL 8X10 ST (GAUZE/BANDAGES/DRESSINGS) ×2 IMPLANT
DURAPREP 26ML APPLICATOR (WOUND CARE) ×2 IMPLANT
ELECT REM PT RETURN 9FT ADLT (ELECTROSURGICAL) ×2
ELECTRODE REM PT RTRN 9FT ADLT (ELECTROSURGICAL) ×1 IMPLANT
EVACUATOR 1/8 PVC DRAIN (DRAIN) ×2 IMPLANT
FACESHIELD LNG OPTICON STERILE (SAFETY) ×10 IMPLANT
GLOVE BIO SURGEON STRL SZ7.5 (GLOVE) IMPLANT
GLOVE BIO SURGEON STRL SZ8 (GLOVE) ×2 IMPLANT
GLOVE BIOGEL PI IND STRL 8 (GLOVE) ×2 IMPLANT
GLOVE BIOGEL PI INDICATOR 8 (GLOVE) ×2
GOWN STRL NON-REIN LRG LVL3 (GOWN DISPOSABLE) ×6 IMPLANT
GOWN STRL REIN XL XLG (GOWN DISPOSABLE) ×2 IMPLANT
HANDPIECE INTERPULSE COAX TIP (DISPOSABLE) ×1
IMMOBILIZER KNEE 20 (SOFTGOODS) ×2
IMMOBILIZER KNEE 20 THIGH 36 (SOFTGOODS) ×1 IMPLANT
KIT BASIN OR (CUSTOM PROCEDURE TRAY) ×2 IMPLANT
MANIFOLD NEPTUNE II (INSTRUMENTS) ×2 IMPLANT
NS IRRIG 1000ML POUR BTL (IV SOLUTION) ×2 IMPLANT
PACK TOTAL JOINT (CUSTOM PROCEDURE TRAY) ×2 IMPLANT
PAD ABD 7.5X8 STRL (GAUZE/BANDAGES/DRESSINGS) ×2 IMPLANT
PADDING CAST COTTON 6X4 STRL (CAST SUPPLIES) ×2 IMPLANT
POSITIONER SURGICAL ARM (MISCELLANEOUS) ×2 IMPLANT
SET HNDPC FAN SPRY TIP SCT (DISPOSABLE) ×1 IMPLANT
SPONGE GAUZE 4X4 12PLY (GAUZE/BANDAGES/DRESSINGS) ×2 IMPLANT
STRIP CLOSURE SKIN 1/2X4 (GAUZE/BANDAGES/DRESSINGS) ×2 IMPLANT
SUCTION FRAZIER 12FR DISP (SUCTIONS) ×2 IMPLANT
SUT MNCRL AB 4-0 PS2 18 (SUTURE) ×2 IMPLANT
SUT PDS AB 1 CT1 27 (SUTURE) IMPLANT
SUT VIC AB 2-0 CT1 27 (SUTURE) ×3
SUT VIC AB 2-0 CT1 TAPERPNT 27 (SUTURE) ×3 IMPLANT
SUT VLOC 180 0 24IN GS25 (SUTURE) ×2 IMPLANT
TOWEL OR 17X26 10 PK STRL BLUE (TOWEL DISPOSABLE) ×4 IMPLANT
TRAY FOLEY CATH 14FRSI W/METER (CATHETERS) ×2 IMPLANT
WATER STERILE IRR 1500ML POUR (IV SOLUTION) ×2 IMPLANT
WRAP KNEE MAXI GEL POST OP (GAUZE/BANDAGES/DRESSINGS) ×4 IMPLANT

## 2012-02-20 NOTE — Op Note (Signed)
Pre-operative diagnosis- Osteoarthritis  Right knee(s)  Post-operative diagnosis- Osteoarthritis Right knee(s)  Procedure-  Right  Total Knee Arthroplasty  Surgeon- Johnny Rankin. Johnny Meals, MD  Assistant- Johnny Able, PA-C   Anesthesia-  Spinal EBL-* No blood loss amount entered *  Drains Hemovac  Tourniquet time-  Total Tourniquet Time Documented: Thigh (Right) - 42 minutes   Complications- None  Condition-PACU - hemodynamically stable.   Brief Clinical Note  Johnny [name] is a 51 y.o. year old male with end stage OA of his right knee with progressively worsening pain and dysfunction. He has constant pain, with activity and at rest and significant functional deficits with difficulties even with ADLs. He has had extensive non-op management including analgesics, injections of cortisone and viscosupplements, and home exercise program, but remains in significant pain with significant dysfunction. 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 He presents now for right Total Knee Arthroplasty.    Procedure in detail---   The patient is brought into the operating room and positioned supine on the operating table. After successful administration of  Spinal,   a tourniquet is placed high on the  Right thigh(s) and the lower extremity is prepped and draped in the usual sterile fashion. Time out is performed by the operating team and then the  Right lower extremity is wrapped in Esmarch, knee flexed and the tourniquet inflated to 300 mmHg.       A midline incision is made with a ten blade through the subcutaneous tissue to the level of the extensor mechanism. A fresh blade is used to make a medial parapatellar arthrotomy. Soft tissue over the proximal medial tibia is subperiosteally elevated to the joint line with a knife and into the semimembranosus bursa with a Cobb elevator. Soft tissue over the proximal lateral tibia is elevated with attention being paid  to avoiding the patellar tendon on the tibial tubercle. The patella is everted, knee flexed 90 degrees and the ACL and PCL are removed. Findings are bone on bone patellofemoral with medial and lateral chondral defects and large osteophytes.        The drill is used to create a starting hole in the distal femur and the canal is thoroughly irrigated with sterile saline to remove the fatty contents. The 5 degree Right  valgus alignment guide is placed into the femoral canal and the distal femoral cutting block is pinned to remove 11 mm off the distal femur. Resection is made with an oscillating saw.      The tibia is subluxed forward and the menisci are removed. The extramedullary alignment guide is placed referencing proximally at the medial aspect of the tibial tubercle and distally along the second metatarsal axis and tibial crest. The block is pinned to remove 2mm off the more deficient medial  side. Resection is made with an oscillating saw. Size 4is the most appropriate size for the tibia and the proximal tibia is prepared with the modular drill and keel punch for that size.      The femoral sizing guide is placed and size 4 is most appropriate. Rotation is marked off the epicondylar axis and confirmed by creating a rectangular flexion gap at 90 degrees. The size 4 cutting block is pinned in this rotation and the anterior, posterior and chamfer cuts are made with the oscillating saw. The intercondylar block is then placed and that cut is made.      Trial size 4 tibial component, trial size  4 posterior stabilized femur and a 10  mm posterior stabilized rotating platform insert trial is placed. Full extension is achieved with excellent varus/valgus and anterior/posterior balance throughout full range of motion. The patella is everted and thickness measured to be 24  mm. Free hand resection is taken to 14 mm, a 38 template is placed, lug holes are drilled, trial patella is placed, and it tracks normally.  Osteophytes are removed off the posterior femur with the trial in place. All trials are removed and the cut bone surfaces prepared with pulsatile lavage. Cement is mixed and once ready for implantation, the size 4 tibial implant, size  4 posterior stabilized femoral component, and the size 38 patella are cemented in place and the patella is held with the clamp. The trial insert is placed and the knee held in full extension. All extruded cement is removed and once the cement is hard the permanent 10 mm posterior stabilized rotating platform insert is placed into the tibial tray.      The wound is copiously irrigated with saline solution and the extensor mechanism closed over a hemovac drain with #1 PDS suture. The tourniquet is released for a total tourniquet time of 42  minutes. Flexion against gravity is 140 degrees and the patella tracks normally. Subcutaneous tissue is closed with 2.0 vicryl and subcuticular with running 4.0 Monocryl. The catheter for the Marcaine pain pump is placed and the pump is initiated. The incision is cleaned and dried and steri-strips and a bulky sterile dressing are applied. The limb is placed into a knee immobilizer and the patient is awakened and transported to recovery in stable condition.      Please note that a surgical assistant was a medical necessity for this procedure in order to perform it in a safe and expeditious manner. Surgical assistant was necessary to retract the ligaments and vital neurovascular structures to prevent injury to them and also necessary for proper positioning of the limb to allow for anatomic placement of the prosthesis.   Johnny Rankin [name], MD    02/20/2012, 2:11 PM

## 2012-02-20 NOTE — Anesthesia Procedure Notes (Signed)
Spinal  Patient location during procedure: OR Staffing Performed by: anesthesiologist  Preanesthetic Checklist Completed: patient identified, site marked, surgical consent, pre-op evaluation, timeout performed, IV checked, risks and benefits discussed and monitors and equipment checked Spinal Block Patient position: sitting Prep: Betadine Patient monitoring: heart rate, continuous pulse ox and blood pressure Injection technique: single-shot Needle Needle type: Sprotte  Needle gauge: 24 G Needle length: 12.7 cm Assessment Sensory level: T10 Additional Notes Expiration date of kit checked and confirmed. Patient tolerated procedure well, without complications.

## 2012-02-20 NOTE — Plan of Care (Signed)
Problem: Consults Goal: Diagnosis- Total Joint Replacement Right total knee     

## 2012-02-20 NOTE — Anesthesia Postprocedure Evaluation (Signed)
  Anesthesia Post-op Note  Patient: Johnny [name]  Procedure(s) Performed: Procedure(s) (LRB): TOTAL KNEE ARTHROPLASTY (Right)  Patient Location: PACU  Anesthesia Type: Spinal  Level of Consciousness: awake and alert   Airway and Oxygen Therapy: Patient Spontanous Breathing  Post-op Pain: mild  Post-op Assessment: Post-op Vital signs reviewed, Patient's Cardiovascular Status Stable, Respiratory Function Stable, Patent Airway and No signs of Nausea or vomiting  Post-op Vital Signs: stable  Complications: No apparent anesthesia complications

## 2012-02-20 NOTE — Anesthesia Preprocedure Evaluation (Signed)
Anesthesia Evaluation  Patient identified by MRN, date of birth, ID band Patient awake    Reviewed: Allergy & Precautions, H&P , NPO status , Patient's Chart, lab work & pertinent test results  Airway Mallampati: II TM Distance: <3 FB Neck ROM: Full    Dental No notable dental hx.    Pulmonary neg pulmonary ROS,  breath sounds clear to auscultation  + decreased breath sounds      Cardiovascular negative cardio ROS  Rhythm:Regular Rate:Normal     Neuro/Psych Bipolar Disorder negative neurological ROS     GI/Hepatic negative GI ROS, Neg liver ROS,   Endo/Other  Morbid obesity  Renal/GU negative Renal ROS  negative genitourinary   Musculoskeletal negative musculoskeletal ROS (+)   Abdominal (+) + obese,   Peds negative pediatric ROS (+)  Hematology negative hematology ROS (+)   Anesthesia Other Findings   Reproductive/Obstetrics negative OB ROS                           Anesthesia Physical Anesthesia Plan  ASA: III  Anesthesia Plan: Spinal   Post-op Pain Management:    Induction:   Airway Management Planned: Simple Face Mask  Additional Equipment:   Intra-op Plan:   Post-operative Plan:   Informed Consent: I have reviewed the patients History and Physical, chart, labs and discussed the procedure including the risks, benefits and alternatives for the proposed anesthesia with the patient or authorized representative who has indicated his/her understanding and acceptance.     Plan Discussed with: CRNA and Surgeon  Anesthesia Plan Comments:         Anesthesia Quick Evaluation

## 2012-02-20 NOTE — Interval H&P Note (Signed)
History and Physical Interval Note:  02/20/2012 12:49 PM  Johnny [name]  has presented today for surgery, with the diagnosis of Osteoarthritis of the Right Knee  The various methods of treatment have been discussed with the patient and family. After consideration of risks, benefits and other options for treatment, the patient has consented to  Procedure(s) (LRB): TOTAL KNEE ARTHROPLASTY (Right) as a surgical intervention .  The patient's history has been reviewed, patient examined, no change in status, stable for surgery.  I have reviewed the patient's chart and labs.  Questions were answered to the patient's satisfaction.     Johnny [name]

## 2012-02-20 NOTE — Transfer of Care (Signed)
Immediate Anesthesia Transfer of Care Note  Patient: Johnny [name]  Procedure(s) Performed: Procedure(s) (LRB): TOTAL KNEE ARTHROPLASTY (Right)  Patient Location: PACU  Anesthesia Type: Regional  Level of Consciousness: awake, alert , oriented and patient cooperative  Airway & Oxygen Therapy: Patient Spontanous Breathing and Patient connected to face mask oxygen  Post-op Assessment: Report given to PACU RN and Post -op Vital signs reviewed and stable  Post vital signs: stable  Complications: No apparent anesthesia complications

## 2012-02-20 NOTE — H&P (View-Only) (Signed)
Johnny [name]  DOB: 10/12/1960 Married / Language: English / Race: White / Male  Date of Admission:  02/20/2012  Chief complaint:  Right Knee Pain  History of Present Illness The patient is a 51 year old male who comes in for a preoperative History and Physical. The patient is scheduled for a right total knee arthroplasty to be performed by Dr. Frank V. [name], MD at [facility] on 02/20/2012. The patient is a 51 year old male who presents today for follow up of their knee. The patient is being followed for their right knee pain and osteoarthritis. The patient has not gotten any relief of their symptoms with viscosupplementation. Note for "Follow-up Knee": His knee is getting worse, eventhough he has been icing it twice a day. The Synvisc series did not help him. The continues to buckle on him. [name] is at a stage now where the knee is hurting him at all times. It is limiting what he can and cannot do. He is at a stage where he wants to get this fixed. He is tired of the pain and dysfunction. He does have pain at night also. They have been treated conservatively in the past for the above stated problem and despite conservative measures, they continue to have progressive pain and severe functional limitations and dysfunction. They have failed non-operative management. It is felt that they would benefit from undergoing total joint replacement. Risks and benefits of the procedure have been discussed with the patient and they elect to proceed with surgery. There are no active contraindications to surgery such as ongoing infection or rapidly progressive neurological disease.   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 Type 2 Depression. History of Chronic Bronchitis. History Of Pneumonia.  History Of Vertigo Alcoholism Measles Mumps   Allergies No Known Drug Allergies. 09/18/2011   Family History Depression. mother Hypertension. mother Other medical problems. Son Johnny [name] has Asperger syndrome and mild MR Father. Deceased, Lung Cancer.   Social History Drug/Alcohol Rehab (Currently). no Alcohol use. former drinker Marital status. married Living situation. live with spouse Children. 2 Current work status. working full time Previously in rehab. yes Pain Contract. no Exercise. Exercises rarely; does running / walking Illicit drug use. yes Tobacco use. 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 greater than 5 Tobacco / smoke exposure. no Advance Directives. Healthcare POA Post-Surgical Plans. Plan is to go home.   Medication History ALPRAZolam (0.5MG Tablet, Oral) Active. LamoTRIgine (150MG Tablet, Oral) Active. ClonazePAM (0.5MG Tablet, Oral) Active. SEROquel XR (300MG Tablet ER 24HR, Oral) Active. Sertraline HCl (50MG Tablet, Oral) Active. Vitamin D (Ergocalciferol) (50000UNIT Capsule, Oral) Active.   Past Surgical History Inguinal Hernia Repair. open: right   Vitals Weight: 280 lb Height: 70.5 in Body Surface Area: 2.51 m Body Mass Index: 39.61 kg/m Pulse: 76 (Regular) Resp.: 12 (Unlabored) BP: 124/72 (Sitting, Right Arm, Standard)    Physical Exam The physical exam findings are as follows:   General Mental Status - Alert, cooperative and good historian. General Appearance- pleasant. Not in acute distress. Orientation- Oriented X3. Build & Nutrition- Well nourished and Well developed.   Head and Neck Head- normocephalic, atraumatic . Neck Global Assessment- supple. no bruit auscultated on the right and no bruit auscultated on the left.   Eye Pupil- Bilateral- Regular and  Round. Motion- Bilateral-   EOMI. wears glasses  Chest and Lung Exam Auscultation: Breath sounds:- clear at anterior chest wall and - clear at posterior chest wall. Adventitious sounds:- No Adventitious sounds.   Cardiovascular Auscultation:Rhythm- Regular rate and rhythm. Heart Sounds- S1 WNL and S2 WNL. Murmurs & Other Heart Sounds:Auscultation of the heart reveals - No Murmurs.   Abdomen Inspection:Contour- Generalized moderate distention. Palpation/Percussion:Tenderness- Abdomen is non-tender to palpation. Rigidity (guarding)- Abdomen is soft. Auscultation:Auscultation of the abdomen reveals - Bowel sounds normal.   Male Genitourinary Not done, not pertinent to present illness  Musculoskeletal On exam, he is alert and oriented in no apparent distress. His right knee shows very mild swelling. His range is about 5 to 120 with marked crepitus on range of motion. Tenderness medial greater than lateral with no instability.  Assessment & Plan Osteoarthritis, Knee (715.96) Impression: Right Knee  Note: Patient is for a Right Total Knee Repalcement by Dr. [name].  Plan is to go home.  Dr. Vivyan Sun  Signed electronically by DREW L PERKINS, PA-C 

## 2012-02-21 LAB — BASIC METABOLIC PANEL
BUN: 7 mg/dL (ref 6–23)
Calcium: 8.4 mg/dL (ref 8.4–10.5)
GFR calc non Af Amer: >90 mL/min (ref >90)
Glucose, Bld: 126 mg/dL — ABNORMAL HIGH (ref 70–99)

## 2012-02-21 LAB — CBC
HCT: 43.3 % (ref 39.0–52.0)
Hemoglobin: 14.6 g/dL (ref 13.0–17.0)
MCH: 29.4 pg (ref 26.0–34.0)
MCHC: 33.7 g/dL (ref 30.0–36.0)

## 2012-02-21 MED ORDER — CLONAZEPAM 0.5 MG PO TABS
0.5000 mg | ORAL_TABLET | Freq: Three times a day (TID) | ORAL | Status: DC
  Administered 2012-02-21 (×2): 0.5 mg via ORAL
  Filled 2012-02-21 (×3): qty 1

## 2012-02-21 MED ORDER — SERTRALINE HCL 50 MG PO TABS
50.0000 mg | ORAL_TABLET | Freq: Every day | ORAL | Status: DC
  Administered 2012-02-22 – 2012-02-23 (×2): 50 mg via ORAL
  Filled 2012-02-21 (×2): qty 1

## 2012-02-21 MED ORDER — OXCARBAZEPINE 300 MG PO TABS
450.0000 mg | ORAL_TABLET | Freq: Two times a day (BID) | ORAL | Status: DC
  Administered 2012-02-21 – 2012-02-23 (×5): 450 mg via ORAL
  Filled 2012-02-21 (×6): qty 1

## 2012-02-21 MED ORDER — CLONAZEPAM 0.5 MG PO TABS: 0.5000 mg | ORAL_TABLET | Freq: Every day | ORAL | Status: DC

## 2012-02-21 NOTE — Progress Notes (Signed)
CHRISHUN SCHEER  MRN: [id number] DOB/Age: March 25, 1961 51 y.o. Physician: Lynnea Maizes, M.D. 1 Day Post-Op Procedure(s) (LRB): TOTAL KNEE ARTHROPLASTY (Right)  Subjective: Rough night, only slept 1 hr, vomitied this am. Feeling better now. C/o moderate knee pain Vital Signs Temp:  [96.7 F (35.9 C)-98.2 F (36.8 C)] 98.2 F (36.8 C) (08/17 0559) Pulse Rate:  [54-84] 77  (08/17 0559) Resp:  [8-20] 16  (08/17 0559) BP: (92-146)/(53-85) 144/84 mmHg (08/17 0559) SpO2:  [96 %-100 %] 96 % (08/17 0559) Weight:  [126.554 kg (279 lb)] 126.554 kg (279 lb) (08/16 1610)  Lab Results  [facility] 02/21/12 0525  WBC 6.2  HGB 14.6  HCT 43.3  PLT 171   BMET  Basename 02/21/12 0525  NA 137  K 3.7  CL 101  CO2 31  GLUCOSE 126*  BUN 7  CREATININE 0.87  CALCIUM 8.4   INR  Date Value Range Status  02/09/2012 1.00  0.00 - 1.49 Final     Exam  Dressings dry, hemovac d/c'd, N/V intact distally RLE  Plan Mobilize per TKA protocol Zakhai Meisinger M 02/21/2012, 9:29 AM

## 2012-02-21 NOTE — Evaluation (Signed)
Physical Therapy Evaluation Patient Details Name: Johnny [name] MRN: [id number] DOB: 1960-07-14 Today's Date: 02/21/2012 Time: 7829-5621 PT Time Calculation (min): 52 min  PT Assessment / Plan / Recommendation Clinical Impression  Pt with R TKR presents with decreased R LE strength/ROM and limitations in functional mobiltiy    PT Assessment  Patient needs continued PT services    Follow Up Recommendations  Home health PT    Barriers to Discharge        Equipment Recommendations  Rolling walker with 5" wheels (wide RW)    Recommendations for Other Services OT consult   Frequency 7X/week    Precautions / Restrictions Precautions Precautions: Knee Required Braces or Orthoses: Knee Immobilizer - Right Knee Immobilizer - Right: Discontinue once straight leg raise with < 10 degree lag Restrictions Weight Bearing Restrictions: No Other Position/Activity Restrictions: WBAT   Pertinent Vitals/Pain 6/10 with activity; min c/o pain at rest.  Pt premedicated and cold packs provided      Mobility  Bed Mobility Bed Mobility: Supine to Sit Details for Bed Mobility Assistance: cues for sequence and use of UEs to assist Transfers Transfers: Sit to Stand;Stand to Sit Sit to Stand: 1: +2 Total assist Sit to Stand: Patient Percentage: 70% Stand to Sit: 1: +2 Total assist Stand to Sit: Patient Percentage: 70% Details for Transfer Assistance: cues for use of UEs and for LE management Ambulation/Gait Ambulation/Gait Assistance: 1: +2 Total assist Ambulation/Gait: Patient Percentage: 70% Ambulation Distance (Feet): 8 Feet Assistive device: Rolling walker Ambulation/Gait Assistance Details: cues for posture, sequence and position from RW Gait Pattern: Step-to pattern    Exercises Total Joint Exercises Ankle Circles/Pumps: AROM;10 reps;Supine;Both Quad Sets: AROM;10 reps;Both;Supine Heel Slides: AAROM;Supine;Right;10 reps Straight Leg Raises: AAROM;Right;10 reps;Supine   PT  Diagnosis: Difficulty walking  PT Problem List: Decreased strength;Decreased range of motion;Decreased activity tolerance;Decreased knowledge of use of DME;Pain;Obesity PT Treatment Interventions: DME instruction;Stair training;Gait training;Functional mobility training;Patient/family education;Therapeutic exercise   PT Goals Acute Rehab PT Goals PT Goal Formulation: With patient Time For Goal Achievement: 02/25/12 Potential to Achieve Goals: Good Pt will go Supine/Side to Sit: with supervision PT Goal: Supine/Side to Sit - Progress: Goal set today Pt will go Sit to Supine/Side: with supervision PT Goal: Sit to Supine/Side - Progress: Goal set today Pt will go Sit to Stand: with supervision PT Goal: Sit to Stand - Progress: Goal set today Pt will go Stand to Sit: with supervision PT Goal: Stand to Sit - Progress: Goal set today Pt will Ambulate: 51 - 150 feet;with supervision;with rolling walker PT Goal: Ambulate - Progress: Goal set today Pt will Go Up / Down Stairs: 6-9 stairs;with min assist;with least restrictive assistive device PT Goal: Up/Down Stairs - Progress: Goal set today  Visit Information  Last PT Received On: 02/21/12 Assistance Needed: +2    Subjective Data  Subjective: Pt c/o dizziness with OOB activity - BP 145/78 Patient Stated Goal: Resume previous lifestyle with decreased pain   Prior Functioning  Home Living Lives With: Spouse Available Help at Discharge: Family Type of Home: House Home Access: Stairs to enter Secretary/administrator of Steps: 2 Entrance Stairs-Rails: Right Home Layout: Multi-level Alternate Level Stairs-Number of Steps: 7 Alternate Level Stairs-Rails: Right Prior Function Level of Independence: Independent Able to Take Stairs?: Yes Driving: Yes Vocation: Full time employment Communication Communication: No difficulties    Cognition  Overall Cognitive Status: Appears within functional limits for tasks  assessed/performed Arousal/Alertness: Awake/alert Orientation Level: Appears intact for tasks assessed Behavior During Session:  WFL for tasks performed    Extremity/Trunk Assessment Right Upper Extremity Assessment RUE ROM/Strength/Tone: [facility] for tasks assessed Left Upper Extremity Assessment LUE ROM/Strength/Tone: WFL for tasks assessed Right Lower Extremity Assessment RLE ROM/Strength/Tone: Deficits RLE ROM/Strength/Tone Deficits: 2+/5 quads with aarom -10 - 40 Left Lower Extremity Assessment LLE ROM/Strength/Tone: WFL for tasks assessed   Balance    End of Session PT - End of Session Equipment Utilized During Treatment: Right knee immobilizer Activity Tolerance: Other (comment) (c/o dizziness) Patient left: in chair;with call bell/phone within reach;with family/visitor present Nurse Communication: Mobility status  GP     [name] 02/21/2012, 1:30 PM

## 2012-02-21 NOTE — Progress Notes (Signed)
Physical Therapy Treatment Patient Details Name: Johnny [name] MRN: [id number] DOB: 10-Feb-1961 Today's Date: 02/21/2012 Time: 1340-1403 PT Time Calculation (min): 23 min  PT Assessment / Plan / Recommendation Comments on Treatment Session       Follow Up Recommendations  Home health PT    Barriers to Discharge        Equipment Recommendations  Rolling walker with 5" wheels    Recommendations for Other Services OT consult  Frequency 7X/week   Plan Discharge plan remains appropriate    Precautions / Restrictions Precautions Precautions: Knee Required Braces or Orthoses: Knee Immobilizer - Right Knee Immobilizer - Right: Discontinue once straight leg raise with < 10 degree lag Restrictions Weight Bearing Restrictions: No Other Position/Activity Restrictions: WBAT   Pertinent Vitals/Pain 5/10    Mobility  Bed Mobility Bed Mobility: Sit to Supine Sit to Supine: 4: Min assist;3: Mod assist Details for Bed Mobility Assistance: cues for sequence and use of UEs to assist Transfers Transfers: Sit to Stand;Stand to Sit Sit to Stand: 4: Min assist;3: Mod assist Stand to Sit: 4: Min assist;3: Mod assist Details for Transfer Assistance: cues for use of UEs and for LE management Ambulation/Gait Ambulation/Gait Assistance: 3: Mod assist;4: Min assist Ambulation Distance (Feet): 35 Feet Assistive device: Rolling walker Ambulation/Gait Assistance Details: cues for sequence, posture, position from RW and stride length Gait Pattern: Step-to pattern;Decreased step length - left;Decreased stance time - right    Exercises     PT Diagnosis:    PT Problem List:   PT Treatment Interventions:     PT Goals Acute Rehab PT Goals PT Goal Formulation: With patient Time For Goal Achievement: 02/25/12 Potential to Achieve Goals: Good Pt will go Supine/Side to Sit: with supervision PT Goal: Supine/Side to Sit - Progress: Goal set today Pt will go Sit to Supine/Side: with supervision PT  Goal: Sit to Supine/Side - Progress: Goal set today Pt will go Sit to Stand: with supervision PT Goal: Sit to Stand - Progress: Progressing toward goal Pt will go Stand to Sit: with supervision PT Goal: Stand to Sit - Progress: Progressing toward goal Pt will Ambulate: 51 - 150 feet;with supervision;with rolling walker PT Goal: Ambulate - Progress: Progressing toward goal Pt will Go Up / Down Stairs: 6-9 stairs;with min assist;with least restrictive assistive device PT Goal: Up/Down Stairs - Progress: Goal set today  Visit Information  Last PT Received On: 02/21/12 Assistance Needed: +1    Subjective Data  Subjective: I'm feeling better, just tired Patient Stated Goal: Resume previous lifestyle with decreased pain   Cognition  Overall Cognitive Status: Appears within functional limits for tasks assessed/performed Arousal/Alertness: Awake/alert Orientation Level: Appears intact for tasks assessed Behavior During Session: [facility] for tasks performed    Balance     End of Session PT - End of Session Equipment Utilized During Treatment: Right knee immobilizer Activity Tolerance: Patient tolerated treatment well Patient left: in bed;with call bell/phone within reach Nurse Communication: Mobility status CPM Right Knee CPM Right Knee: On   GP     Johnny [name] 02/21/2012, 3:24 PM

## 2012-02-21 NOTE — Progress Notes (Signed)
CM spoke with patient concerning dc planning. Per pt choice [facility] to provide [facility] services upon discharge. Pt request RW, [facility] notified of DME referral. DME delivery scheduled to room prior to dc. Patient states spouse & adult son to assist in home care. No other needs specified at this time.   Leonie Green [phone]

## 2012-02-22 LAB — BASIC METABOLIC PANEL
BUN: 7 mg/dL (ref 6–23)
Creatinine, Ser: 0.98 mg/dL (ref 0.50–1.35)
GFR calc Af Amer: >90 mL/min (ref >90)
Glucose, Bld: 133 mg/dL — ABNORMAL HIGH (ref 70–99)
Sodium: 131 mEq/L — ABNORMAL LOW (ref 135–145)

## 2012-02-22 LAB — CBC
MCH: 29.6 pg (ref 26.0–34.0)
MCHC: 34 g/dL (ref 30.0–36.0)
Platelets: 157 10*3/uL (ref 150–400)

## 2012-02-22 MED ORDER — CLONAZEPAM 0.5 MG PO TABS
0.5000 mg | ORAL_TABLET | Freq: Every day | ORAL | Status: DC
  Administered 2012-02-22: 0.5 mg via ORAL
  Filled 2012-02-22: qty 1

## 2012-02-22 MED ORDER — CLONAZEPAM 0.5 MG PO TABS
0.5000 mg | ORAL_TABLET | Freq: Every day | ORAL | Status: DC
  Administered 2012-02-22 – 2012-02-23 (×2): 0.5 mg via ORAL
  Filled 2012-02-22: qty 1

## 2012-02-22 NOTE — Progress Notes (Signed)
Physical Therapy Treatment Patient Details Name: Johnny [name] MRN: [id number] DOB: 10-03-1960 Today's Date: 02/22/2012 Time: 1191-4782 PT Time Calculation (min): 22 min  PT Assessment / Plan / Recommendation Comments on Treatment Session       Follow Up Recommendations  Home health PT    Barriers to Discharge        Equipment Recommendations  3 in 1 bedside comode;Other (comment)    Recommendations for Other Services OT consult  Frequency 7X/week   Plan Discharge plan remains appropriate    Precautions / Restrictions Precautions Precautions: Knee Required Braces or Orthoses: Knee Immobilizer - Right Knee Immobilizer - Right: Discontinue once straight leg raise with < 10 degree lag Restrictions Weight Bearing Restrictions: No Other Position/Activity Restrictions: WBAT   Pertinent Vitals/Pain BP 132/78, HR 111; pt c/o dizziness during ambulation    Mobility  Bed Mobility Bed Mobility: Sit to Supine Sit to Supine: 4: Min assist;3: Mod assist Details for Bed Mobility Assistance: assist with L LE Transfers Transfers: Sit to Stand;Stand to Sit Sit to Stand: 4: Min assist Stand to Sit: 4: Min assist Details for Transfer Assistance: cues for use of UEs Ambulation/Gait Ambulation/Gait Assistance: 4: Min assist;3: Mod assist Ambulation Distance (Feet): 40 Feet Assistive device: Rolling walker Ambulation/Gait Assistance Details: cues for posture, sequence, stride length, step-to gait and position from RW Gait Pattern: Step-to pattern;Decreased step length - left;Decreased stance time - right    Exercises     PT Diagnosis:    PT Problem List:   PT Treatment Interventions:     PT Goals Acute Rehab PT Goals PT Goal Formulation: With patient Time For Goal Achievement: 02/25/12 Potential to Achieve Goals: Good Pt will go Supine/Side to Sit: with supervision PT Goal: Supine/Side to Sit - Progress: Progressing toward goal Pt will go Sit to Supine/Side: with  supervision PT Goal: Sit to Supine/Side - Progress: Progressing toward goal Pt will go Sit to Stand: with supervision PT Goal: Sit to Stand - Progress: Progressing toward goal Pt will go Stand to Sit: with supervision PT Goal: Stand to Sit - Progress: Progressing toward goal Pt will Ambulate: 51 - 150 feet;with supervision;with rolling walker PT Goal: Ambulate - Progress: Progressing toward goal Pt will Go Up / Down Stairs: 6-9 stairs;with min assist;with least restrictive assistive device  Visit Information  Last PT Received On: 02/22/12 Assistance Needed: +1    Subjective Data  Subjective: I'm feeling better than this morning  Patient Stated Goal: Resume previous lifestyle with decreased pain   Cognition  Overall Cognitive Status: Appears within functional limits for tasks assessed/performed Arousal/Alertness: Awake/alert Orientation Level: Appears intact for tasks assessed Behavior During Session: [facility] for tasks performed Cognition - Other Comments: a little groggy; multiple cues for sequencing    Balance     End of Session PT - End of Session Equipment Utilized During Treatment: Right knee immobilizer Activity Tolerance: Other (comment) (pt c/o dizziness with ambulation) Patient left: in bed;with call bell/phone within reach;with family/visitor present Nurse Communication: Mobility status;Other (comment) (pt BP 132/78 with HR 11)   GP     Lacretia Tindall 02/22/2012, 1:48 PM

## 2012-02-22 NOTE — Evaluation (Signed)
Occupational Therapy Evaluation Patient Details Name: Johnny [name] MRN: [id number] DOB: 07/26/1960 Today's Date: 02/22/2012 Time: 7829-5621 OT Time Calculation (min): 23 min  OT Assessment / Plan / Recommendation Clinical Impression  This 51 year old man was admitted for R TKA.  Will follow in acute to reinforce bathroom transfers for safe discharge home.  Pt will not need post acute OT services    OT Assessment  Patient needs continued OT Services    Follow Up Recommendations  No OT follow up    Barriers to Discharge      Equipment Recommendations  3 in 1 bedside comode;Other (comment) (pt would prefer wide; fits on elongated but not comfortable)    Recommendations for Other Services    Frequency  Min 2X/week    Precautions / Restrictions Precautions Precautions: Knee Required Braces or Orthoses: Knee Immobilizer - Right Knee Immobilizer - Right: Discontinue once straight leg raise with < 10 degree lag Restrictions Weight Bearing Restrictions: No Other Position/Activity Restrictions: WBAT   Pertinent Vitals/Pain R knee sore. Had decreased BP earlier.  At time of eval:  151/85 sitting; 125 HR; 95% sats on RA    ADL  Grooming: Simulated;Set up Where Assessed - Grooming: Unsupported sitting Upper Body Bathing: Simulated;Set up Where Assessed - Upper Body Bathing: Unsupported sitting Lower Body Bathing: Simulated;Minimal assistance Where Assessed - Lower Body Bathing: Supported sit to stand Upper Body Dressing: Simulated;Set up Where Assessed - Upper Body Dressing: Unsupported sitting Lower Body Dressing: Simulated;Minimal assistance Where Assessed - Lower Body Dressing: Supported sit to stand Toilet Transfer: Performed;Minimal assistance (mod cues for sequence/lifting L foot) Toilet Transfer Method: Stand pivot Acupuncturist: Materials engineer and Hygiene: Simulated;Minimal assistance Where Assessed - Medical sales representative and Hygiene: Sit to stand from 3-in-1 or toilet Tub/Shower Transfer:  (not ready:  unable to clear ledge) Equipment Used: Gait belt;Rolling walker Transfers/Ambulation Related to ADLs: pt shuffles feet; mod cues for sequencing with spt's ADL Comments: wife/son will help with adls.  can reach to L foot    OT Diagnosis: Generalized weakness  OT Problem List: Decreased knowledge of use of DME or AE;Decreased activity tolerance OT Treatment Interventions: Self-care/ADL training;DME and/or AE instruction;Patient/family education   OT Goals Acute Rehab OT Goals OT Goal Formulation: With patient/family Time For Goal Achievement: 02/29/12 Potential to Achieve Goals: Good ADL Goals Pt Will Transfer to Toilet: Ambulation;Extra wide 3-in-1;with min assist (min guard) ADL Goal: Toilet Transfer - Progress: Goal set today Pt Will Perform Tub/Shower Transfer: Shower transfer;Ambulation;with min assist (min guard) ADL Goal: Tub/Shower Transfer - Progress: Goal set today  Visit Information  Last OT Received On: 02/22/12 Assistance Needed: +1    Subjective Data  Subjective: "I don't quite feel like myself" Patient Stated Goal: none stated   Prior Functioning  Vision/Perception  Home Living Lives With: Spouse Available Help at Discharge: Family Bathroom Shower/Tub: Walk-in Stage manager: Standard Prior Function Level of Independence: Independent Communication Communication: No difficulties      Cognition  Overall Cognitive Status: Appears within functional limits for tasks assessed/performed Arousal/Alertness: Awake/alert Orientation Level: Appears intact for tasks assessed Behavior During Session: [facility] for tasks performed Cognition - Other Comments: a little groggy; multiple cues for sequencing    Extremity/Trunk Assessment Right Upper Extremity Assessment RUE ROM/Strength/Tone: [facility] for tasks assessed Left Upper Extremity Assessment LUE ROM/Strength/Tone: WFL  for tasks assessed   Mobility  Transfers Sit to Stand: 4: Min assist;With armrests;From chair/3-in-1 Stand to Sit: 4: Min  assist; Details for Transfer Assistance: mod sequencing cues   Exercise   Balance    End of Session OT - End of Session Activity Tolerance: Patient limited by fatigue Patient left: in chair;with call bell/phone within reach;with family/visitor present Nurse Communication: Other (comment) (vitals) CPM Right Knee CPM Right Knee: Off  GO     Jams Trickett 02/22/2012, 11:42 AM Marica Otter, OTR/L [phone] 02/22/2012

## 2012-02-22 NOTE — Progress Notes (Signed)
Pt voided only 50cc since foley dc'd. Bladder scanned for 144cc. PA B.Dixon notified & he said to resume IVF @ 100 cc/hr. Daleon Willinger, [employer]

## 2012-02-22 NOTE — Progress Notes (Signed)
Physical Therapy Treatment Patient Details Name: DEMETRI GOSHERT MRN: [id number] DOB: 14-Feb-1961 Today's Date: 02/22/2012 Time: 4098-1191 PT Time Calculation (min): 38 min  PT Assessment / Plan / Recommendation Comments on Treatment Session  Pt assisted with dressing lower body, dizzy in standing and transferred to chair with BP 93/64 and HR 137 - RN made aware    Follow Up Recommendations  Home health PT    Barriers to Discharge        Equipment Recommendations  Rolling walker with 5" wheels    Recommendations for Other Services OT consult  Frequency 7X/week   Plan Discharge plan remains appropriate    Precautions / Restrictions Precautions Precautions: Knee Required Braces or Orthoses: Knee Immobilizer - Right Knee Immobilizer - Right: Discontinue once straight leg raise with < 10 degree lag Restrictions Weight Bearing Restrictions: No Other Position/Activity Restrictions: WBAT   Pertinent Vitals/Pain 5/10; premedicated    Mobility  Bed Mobility Bed Mobility: Supine to Sit Supine to Sit: 4: Min assist;3: Mod assist Details for Bed Mobility Assistance: cues for sequence and use of UEs to assist Transfers Transfers: Sit to Stand;Stand to Sit Sit to Stand: 4: Min assist;3: Mod assist Stand to Sit: 4: Min assist;3: Mod assist Details for Transfer Assistance: cues for use of UEs and for LE management Ambulation/Gait Ambulation/Gait Assistance: 3: Mod assist Ambulation Distance (Feet): 6 Feet Assistive device: Rolling walker Ambulation/Gait Assistance Details: cues for posture, sequence, increased UE WB and position from RW Gait Pattern: Step-to pattern;Decreased step length - left;Decreased stance time - right    Exercises Total Joint Exercises Ankle Circles/Pumps: AROM;20 reps;Supine;Both Quad Sets: AROM;20 reps;Supine;Both Heel Slides: AAROM;Supine;20 reps;Right Straight Leg Raises: AAROM;20 reps;Right;Supine   PT Diagnosis:    PT Problem List:   PT Treatment  Interventions:     PT Goals Acute Rehab PT Goals PT Goal Formulation: With patient Time For Goal Achievement: 02/25/12 Potential to Achieve Goals: Good Pt will go Supine/Side to Sit: with supervision PT Goal: Supine/Side to Sit - Progress: Progressing toward goal Pt will go Sit to Supine/Side: with supervision PT Goal: Sit to Supine/Side - Progress: Progressing toward goal Pt will go Sit to Stand: with supervision PT Goal: Sit to Stand - Progress: Progressing toward goal Pt will go Stand to Sit: with supervision PT Goal: Stand to Sit - Progress: Progressing toward goal Pt will Ambulate: 51 - 150 feet;with supervision;with rolling walker PT Goal: Ambulate - Progress: Progressing toward goal  Visit Information  Last PT Received On: 02/22/12 Assistance Needed: +1    Subjective Data  Subjective: I slept much better and think I am doing better Patient Stated Goal: Resume previous lifestyle with decreased pain   Cognition  Overall Cognitive Status: Appears within functional limits for tasks assessed/performed Arousal/Alertness: Awake/alert Orientation Level: Appears intact for tasks assessed Behavior During Session: [facility] for tasks performed    Balance     End of Session PT - End of Session Equipment Utilized During Treatment: Right knee immobilizer Activity Tolerance: Other (comment) (ltd by c/o dizziness) Patient left: in chair;with call bell/phone within reach Nurse Communication: Mobility status;Other (comment) (BP at 93/64 with HR 137) CPM Right Knee CPM Right Knee: Off   GP     Wilhelmine Krogstad 02/22/2012, 11:25 AM

## 2012-02-22 NOTE — Progress Notes (Signed)
Pt request 3n1. [facility] notified of DME referral. Dme scheduled delivery to room Monday am 02/23/12. Rw delivered 02/21/12. No other needs specified at this time.   Leonie Green [phone]

## 2012-02-22 NOTE — Progress Notes (Signed)
Orthopedics Progress Note  Subjective: Pt doing fairly well mild pain to right knee today Therapy going well  Objective:  Filed Vitals:   02/22/12 0604  BP: 121/72  Pulse: 109  Temp: 98.6 F (37 C)  Resp: 18    General: Awake and alert  Musculoskeletal: right knee incision healing well, dressing changed nv intact distally Neurovascularly intact  Lab Results  Component Value Date   WBC 7.4 02/22/2012   HGB 14.1 02/22/2012   HCT 41.5 02/22/2012   MCV 87.0 02/22/2012   PLT 157 02/22/2012       Component Value Date/Time   NA 131* 02/22/2012 0429   K 3.5 02/22/2012 0429   CL 96 02/22/2012 0429   CO2 30 02/22/2012 0429   GLUCOSE 133* 02/22/2012 0429   BUN 7 02/22/2012 0429   CREATININE 0.98 02/22/2012 0429   CALCIUM 8.6 02/22/2012 0429   GFRNONAA >90 02/22/2012 0429   GFRAA >90 02/22/2012 0429    Lab Results  Component Value Date   INR 1.00 02/09/2012   INR 1.00 07/12/2009    Assessment/Plan: POD #2 s/p Procedure(s): right  TOTAL KNEE ARTHROPLASTY  D/c planning PT/OT  Pulmonary toilet  Viviann Spare R. Ranell Patrick, MD 02/22/2012 7:57 AM

## 2012-02-23 LAB — CBC
HCT: 36.9 % — ABNORMAL LOW (ref 39.0–52.0)
Hemoglobin: 12.9 g/dL — ABNORMAL LOW (ref 13.0–17.0)
MCHC: 35 g/dL (ref 30.0–36.0)
MCV: 85.4 fL (ref 78.0–100.0)
RDW: 12.9 % (ref 11.5–15.5)

## 2012-02-23 MED ORDER — RIVAROXABAN 10 MG PO TABS: 10.0000 mg | ORAL_TABLET | Freq: Every day | ORAL | Status: DC

## 2012-02-23 MED ORDER — METHOCARBAMOL 500 MG PO TABS: 500.0000 mg | ORAL_TABLET | Freq: Four times a day (QID) | ORAL | Status: AC | PRN

## 2012-02-23 MED ORDER — OXYCODONE HCL 5 MG PO TABS: 5.0000 mg | ORAL_TABLET | ORAL | Status: AC | PRN

## 2012-02-23 NOTE — Progress Notes (Signed)
Physical Therapy Treatment Patient Details Name: Johnny [name] MRN: [id number] DOB: 01/05/1961 Today's Date: 02/23/2012 Time: 1020-1055 PT Time Calculation (min): 35 min  PT Assessment / Plan / Recommendation Comments on Treatment Session  POD #3 R TKR D/c today.  Spouse present and educated on stairs, HEP and safe handlling during gait and tranfers. No c/o dizzyness today.    Follow Up Recommendations  Home health PT    Barriers to Discharge        Equipment Recommendations  Other (comment) (equip already delivered)    Recommendations for Other Services    Frequency 7X/week   Plan Discharge plan remains appropriate    Precautions / Restrictions Precautions Precautions: Knee Precaution Comments: Instructed pt and spouse on KI use, proper application and when to D/C afet 10 SLR Required Braces or Orthoses: Knee Immobilizer - Right Knee Immobilizer - Right: Discontinue once straight leg raise with < 10 degree lag Restrictions Weight Bearing Restrictions: No Other Position/Activity Restrictions: WBAT    Pertinent Vitals/Pain C/o 4/10 after act ICE applied    Mobility  Bed Mobility Bed Mobility: Supine to Sit Supine to Sit: 4: Min assist Details for Bed Mobility Assistance: Min asssit to support r LE off bed and increased time  Transfers Transfers: Sit to Stand;Stand to Sit Sit to Stand: 4: Min guard;From bed Stand to Sit: 4: Min guard;To chair/3-in-1 Details for Transfer Assistance: 50% VC's on proper tech, hand placement and to extend R LE prior to sit.  Ambulation/Gait Ambulation/Gait Assistance: 4: Min guard Ambulation Distance (Feet): 25 Feet Assistive device: Rolling walker Ambulation/Gait Assistance Details: 50% VC's on proper sequencing and upright paosture.  Spouse instructed on gait safety and assistance level. Pt instructed to increase WB thru R LE and decrease WB thru B UE thru RW. Gait Pattern: Step-to pattern;Decreased stance time - right;Decreased stride  length Gait velocity: decreased   Stairs Min assist with spouse up 2 steps backward with 50% VC's on proper sequencing Min assist to descend 2 steps forward with spouse and 50% VC's on proper tech and safety Handout also given   PT Goals                   progressing    Visit Information  Last PT Received On: 02/23/12 Assistance Needed: +1    Subjective Data      Cognition  Overall Cognitive Status: Appears within functional limits for tasks assessed/performed Arousal/Alertness: Awake/alert Orientation Level: Appears intact for tasks assessed Behavior During Session: [facility] for tasks performed    Balance   fair  End of Session PT - End of Session Equipment Utilized During Treatment: Gait belt;Right knee immobilizer Activity Tolerance: Patient tolerated treatment well;Other (comment) (required increased time) Nurse Communication: Other (comment) (Pt ready for D/C to home)   Felecia Shelling  PTA [facility]  Acute  Rehab Pager     [phone]

## 2012-02-23 NOTE — Progress Notes (Signed)
   Subjective: 3 Days Post-Op Procedure(s) (LRB): TOTAL KNEE ARTHROPLASTY (Right) Patient reports pain as mild.   Plan is to go Home today  Objective: Vital signs in last 24 hours: Temp:  [98.3 F (36.8 C)-99.7 F (37.6 C)] 98.3 F (36.8 C) (08/19 0533) Pulse Rate:  [91-109] 91  (08/19 0533) Resp:  [16-18] 16  (08/19 0533) BP: (112-149)/(76-88) 112/76 mmHg (08/19 0533) SpO2:  [94 %-99 %] 95 % (08/19 0533)  Intake/Output from previous day:  Intake/Output Summary (Last 24 hours) at 02/23/12 0711 Last data filed at 02/23/12 0200  Gross per 24 hour  Intake   1680 ml  Output   1900 ml  Net   -220 ml    Intake/Output this shift:    Labs:  Basename 02/23/12 0426 02/22/12 0429 02/21/12 0525  HGB 12.9* 14.1 14.6    Basename 02/23/12 0426 02/22/12 0429  WBC 5.9 7.4  RBC 4.32 4.77  HCT 36.9* 41.5  PLT 153 157    Basename 02/22/12 0429 02/21/12 0525  NA 131* 137  K 3.5 3.7  CL 96 101  CO2 30 31  BUN 7 7  CREATININE 0.98 0.87  GLUCOSE 133* 126*  CALCIUM 8.6 8.4   No results found for this basename: LABPT:2,INR:2 in the last 72 hours  EXAM General - Patient is Alert, Appropriate and Oriented Extremity - Neurologically intact Neurovascular intact Dorsiflexion/Plantar flexion intact Incision: dressing C/D/I No cellulitis present Compartment soft Dressing/Incision - clean, dry, no drainage Motor Function - intact, moving foot and toes well on exam.   Past Medical History  Diagnosis Date  . Fractures 02-09-12    hx. wrist/ ankle fx. in childhood  . Mental disorder 02-09-12    hx. Bipolar. -Dr. Kirtland Bouchard. [place],psych(monthly)  . Depression   . Bronchitis, allergic 02-09-12    hx. of this ,none recent  . Vertigo 02-09-12    hx. once.  . Arthritis 02-09-12    osteoarthritis-knee.  . Raynaud's syndrome 02-09-12    hx. bil. fingers    Assessment/Plan: 3 Days Post-Op Procedure(s) (LRB): TOTAL KNEE ARTHROPLASTY (Right) Principal Problem:  *OA (osteoarthritis) of  knee   Up with therapy D/C IV fluids Discharge home with home health  DVT Prophylaxis - Xarelto Weight-Bearing as tolerated to right leg  Eugean Arnott V 02/23/2012, 7:11 AM

## 2012-02-23 NOTE — Progress Notes (Signed)
Occupational Therapy Treatment Patient Details Name: Johnny [name] MRN: [id number] DOB: 08/08/1960 Today's Date: 02/23/2012 Time: 4098-1191 OT Time Calculation (min): 12 min  OT Assessment / Plan / Recommendation Comments on Treatment Session Pt progressing well towards goals and will not need f/u OT at home.    Follow Up Recommendations  No OT follow up    Barriers to Discharge       Equipment Recommendations  Other (comment) (equip already delivered)    Recommendations for Other Services    Frequency Min 2X/week   Plan Discharge plan remains appropriate    Precautions / Restrictions Precautions Precautions: Knee Required Braces or Orthoses: Knee Immobilizer - Right Knee Immobilizer - Right: Discontinue once straight leg raise with < 10 degree lag Restrictions Weight Bearing Restrictions: No   Pertinent Vitals/Pain     ADL  Tub/Shower Transfer: Performed;Min guard Tub/Shower Transfer Method: Science writer: Walk in shower ADL Comments: Pt did well with stepping into and out of shower using the backwards method. Minguard A needed with VCs for safe technique.    OT Diagnosis:    OT Problem List:   OT Treatment Interventions:     OT Goals ADL Goals ADL Goal: Tub/Shower Transfer - Progress: Met  Visit Information  Last OT Received On: 02/23/12 Assistance Needed: +1    Subjective Data  Subjective: I'm ready to go home.   Prior Functioning       Cognition  Overall Cognitive Status: Appears within functional limits for tasks assessed/performed Arousal/Alertness: Awake/alert Orientation Level: Appears intact for tasks assessed Behavior During Session: [facility] for tasks performed    Mobility Transfers Sit to Stand: 4: Min guard;With upper extremity assist;From chair/3-in-1;With armrests Stand to Sit: 4: Min guard;With upper extremity assist;To chair/3-in-1 Details for Transfer Assistance: Min cues for hand placement   Exercises      Balance    End of Session OT - End of Session Activity Tolerance: Patient tolerated treatment well Patient left: in chair;with call bell/phone within reach CPM Right Knee CPM Right Knee: Off  GO     Alister Staver A OTR/L [id number] 02/23/2012, 11:45 AM

## 2012-02-25 ENCOUNTER — Encounter (HOSPITAL_COMMUNITY): Payer: Self-pay | Admitting: Orthopedic Surgery

## 2012-03-04 NOTE — Discharge Summary (Signed)
Physician Discharge Summary   Patient ID: Johnny [name] MRN: [id number] DOB/AGE: 02-27-1961 51 y.o.  Admit date: 02/20/2012 Discharge date: 03/04/2012  Primary Diagnosis: Osteoarthritis Right knee  Admission Diagnoses:  Past Medical History  Diagnosis Date  . Fractures 02-09-12    hx. wrist/ ankle fx. in childhood  . Mental disorder 02-09-12    hx. Bipolar. -Dr. Kirtland Bouchard. [place],psych(monthly)  . Depression   . Bronchitis, allergic 02-09-12    hx. of this ,none recent  . Vertigo 02-09-12    hx. once.  . Arthritis 02-09-12    osteoarthritis-knee.  . Raynaud's syndrome 02-09-12    hx. bil. fingers   Discharge Diagnoses:   Principal Problem:  *OA (osteoarthritis) of knee  Procedure:  Procedure(s) (LRB): TOTAL KNEE ARTHROPLASTY (Right)   Consults: None  HPI: Johnny [name] is a 51 y.o. year old male with end stage OA of his right knee with progressively worsening pain and dysfunction. He has constant pain, with activity and at rest and significant functional deficits with difficulties even with ADLs. He has had extensive non-op management including analgesics, injections of cortisone and viscosupplements, and home exercise program, but remains in significant pain with significant dysfunction. 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 He presents now for right Total Knee Arthroplasty.   Laboratory Data: Hospital Outpatient Visit on 02/09/2012  Component Date Value Range Status  . MRSA, PCR 02/09/2012 NEGATIVE  NEGATIVE Final  . Staphylococcus aureus 02/09/2012 NEGATIVE  NEGATIVE Final   Comment:                                 The Xpert SA Assay (FDA                          approved for NASAL specimens                          only), is one component of                          a comprehensive surveillance                          program.  It is not intended                          to diagnose infection nor to   guide or monitor treatment.  Marland Kitchen aPTT 02/09/2012 36  24 - 37 seconds Final  . WBC 02/09/2012 3.9* 4.0 - 10.5 K/uL Final  . RBC 02/09/2012 5.32  4.22 - 5.81 MIL/uL Final  . Hemoglobin 02/09/2012 15.7  13.0 - 17.0 g/dL Final  . HCT 84/69/6295 45.5  39.0 - 52.0 % Final  . MCV 02/09/2012 85.5  78.0 - 100.0 fL Final  . MCH 02/09/2012 29.5  26.0 - 34.0 pg Final  . MCHC 02/09/2012 34.5  30.0 - 36.0 g/dL Final  . RDW 28/41/3244 13.0  11.5 - 15.5 % Final  . Platelets 02/09/2012 167  150 - 400 K/uL Final  . Sodium 02/09/2012 134* 135 - 145 mEq/L Final  . Potassium 02/09/2012 3.5  3.5 - 5.1 mEq/L Final  . Chloride 02/09/2012 97  96 - 112 mEq/L Final  . CO2 02/09/2012 30  19 - 32 mEq/L Final  . Glucose, Bld 02/09/2012 122* 70 - 99 mg/dL Final  . BUN 16/04/9603 11  6 - 23 mg/dL Final  . Creatinine, Ser 02/09/2012 0.97  0.50 - 1.35 mg/dL Final  . Calcium 54/03/8118 9.3  8.4 - 10.5 mg/dL Final  . Total Protein 02/09/2012 6.9  6.0 - 8.3 g/dL Final  . Albumin 14/78/2956 4.1  3.5 - 5.2 g/dL Final  . AST 21/30/8657 14  0 - 37 U/L Final  . ALT 02/09/2012 14  0 - 53 U/L Final  . Alkaline Phosphatase 02/09/2012 57  39 - 117 U/L Final  . Total Bilirubin 02/09/2012 0.3  0.3 - 1.2 mg/dL Final  . GFR calc non Af Amer 02/09/2012 >90  >90 mL/min Final  . GFR calc Af Amer 02/09/2012 >90  >90 mL/min Final   Comment:                                 The eGFR has been calculated                          using the CKD EPI equation.                          This calculation has not been                          validated in all clinical                          situations.                          eGFR's persistently                          <90 mL/min signify                          possible Chronic Kidney Disease.  Marland Kitchen Prothrombin Time 02/09/2012 13.4  11.6 - 15.2 seconds Final  . INR 02/09/2012 1.00  0.00 - 1.49 Final  . Color, Urine 02/09/2012 YELLOW  YELLOW Final  . APPearance 02/09/2012 CLEAR  CLEAR Final  .  Specific Gravity, Urine 02/09/2012 1.027  1.005 - 1.030 Final  . pH 02/09/2012 6.0  5.0 - 8.0 Final  . Glucose, UA 02/09/2012 NEGATIVE  NEGATIVE mg/dL Final  . Hgb urine dipstick 02/09/2012 NEGATIVE  NEGATIVE Final  . Bilirubin Urine 02/09/2012 NEGATIVE  NEGATIVE Final  . Ketones, ur 02/09/2012 NEGATIVE  NEGATIVE mg/dL Final  . Protein, ur 84/69/6295 NEGATIVE  NEGATIVE mg/dL Final  . Urobilinogen, UA 02/09/2012 0.2  0.0 - 1.0 mg/dL Final  . Nitrite 28/41/3244 NEGATIVE  NEGATIVE Final  . Leukocytes, UA 02/09/2012 NEGATIVE  NEGATIVE Final   MICROSCOPIC NOT DONE ON URINES WITH NEGATIVE PROTEIN, BLOOD, LEUKOCYTES, NITRITE, OR GLUCOSE <1000 mg/dL.   No results found for this basename: HGB:5 in the last 72 hours No results found for this basename: WBC:2,RBC:2,HCT:2,PLT:2 in the last 72 hours No results found for this basename: NA:2,K:2,CL:2,CO2:2,BUN:2,CREATININE:2,GLUCOSE:2,CALCIUM:2 in the last 72 hours No results found for this basename: LABPT:2,INR:2 in the last 72 hours  X-Rays:No results found.  EKG: Orders placed during the  hospital encounter of 02/20/12  . EKG     Hospital Course: Patient was admitted to [facility] and taken to the OR and underwent the above state procedure without complications.  Patient tolerated the procedure well and was later transferred to the recovery room and then to the orthopaedic floor for postoperative care.  They were given PO and IV analgesics for pain control following their surgery.  They were given 24 hours of postoperative antibiotics and started on DVT prophylaxis in the form of Xarelto.   PT and OT were ordered for total joint protocol.  Discharge planning consulted to help with postop disposition and equipment needs.  Patient had a rough night on the evening of surgery, only slept 1 hr, vomitied that am. Feeling better later. C/o moderate knee pain.  They started to get up OOB with therapy on day one.  Hemovac drain was pulled without  difficulty.  Continued to work with therapy into day two.  Dressing was changed on day two and the incision was healing well.  By day three, the patient had progressed with therapy and meeting their goals.  Incision was healing well.  Patient was seen in rounds and was ready to go home.  Discharge Medications: Prior to Admission medications   Medication Sig Start Date End Date Taking? Authorizing Provider  clonazePAM (KLONOPIN) 0.5 MG tablet Take 0.5-1 mg by mouth See admin instructions. 1 tablet once in am and 2 tablets at bedtime. Take med twice daily.   Yes Historical Provider, MD  lamoTRIgine (LAMICTAL) 150 MG tablet Take 150 mg by mouth 4 (four) times daily.   Yes Historical Provider, MD  Oxcarbazepine (TRILEPTAL) 300 MG tablet Take 450 mg by mouth 2 (two) times daily.   Yes Historical Provider, MD  QUEtiapine (SEROQUEL XR) 300 MG 24 hr tablet Take 600 mg by mouth at bedtime.   Yes Historical Provider, MD  sertraline (ZOLOFT) 50 MG tablet Take 50 mg by mouth daily before breakfast.   Yes Historical Provider, MD  Vitamin D, Ergocalciferol, (DRISDOL) 50000 UNITS CAPS Take 50,000 Units by mouth 2 (two) times a week. Monday and Thursday   Yes Historical Provider, MD  methocarbamol (ROBAXIN) 500 MG tablet Take 1 tablet (500 mg total) by mouth every 6 (six) hours as needed. 02/23/12 03/04/12  Loanne Drilling, MD  oxyCODONE (OXY IR/ROXICODONE) 5 MG immediate release tablet Take 1-2 tablets (5-10 mg total) by mouth every 3 (three) hours as needed. 02/23/12 03/04/12  Loanne Drilling, MD  rivaroxaban (XARELTO) 10 MG TABS tablet Take 1 tablet (10 mg total) by mouth daily with breakfast. 02/23/12   Loanne Drilling, MD    Diet: Regular diet Activity:WBAT Follow-up:in 2 weeks Disposition - Home Discharged Condition: good   Discharge Orders    Future Orders Please Complete By Expires   Call MD / Call 911      Comments:   If you experience chest pain or shortness of breath, CALL 911 and be transported to  the hospital emergency room.  If you develope a fever above 101 F, pus (white drainage) or increased drainage or redness at the wound, or calf pain, call your surgeon's office.   Constipation Prevention      Comments:   Drink plenty of fluids.  Prune juice may be helpful.  You may use a stool softener, such as Colace (over the counter) 100 mg twice a day.  Use MiraLax (over the counter) for constipation as needed.   Increase activity  slowly as tolerated      Discharge instructions      Comments:   You may shower and change bandage daily     Medication List  As of 03/04/2012 11:33 AM   STOP taking these medications         acetaminophen 500 MG tablet      aspirin 81 MG chewable tablet         TAKE these medications         clonazePAM 0.5 MG tablet   Commonly known as: KLONOPIN   Take 0.5-1 mg by mouth See admin instructions. 1 tablet once in am and 2 tablets at bedtime. Take med twice daily.      lamoTRIgine 150 MG tablet   Commonly known as: LAMICTAL   Take 150 mg by mouth 4 (four) times daily.      methocarbamol 500 MG tablet   Commonly known as: ROBAXIN   Take 1 tablet (500 mg total) by mouth every 6 (six) hours as needed.      Oxcarbazepine 300 MG tablet   Commonly known as: TRILEPTAL   Take 450 mg by mouth 2 (two) times daily.      oxyCODONE 5 MG immediate release tablet   Commonly known as: Oxy IR/ROXICODONE   Take 1-2 tablets (5-10 mg total) by mouth every 3 (three) hours as needed.      QUEtiapine 300 MG 24 hr tablet   Commonly known as: SEROQUEL XR   Take 600 mg by mouth at bedtime.      rivaroxaban 10 MG Tabs tablet   Commonly known as: XARELTO   Take 1 tablet (10 mg total) by mouth daily with breakfast.      sertraline 50 MG tablet   Commonly known as: ZOLOFT   Take 50 mg by mouth daily before breakfast.      Vitamin D (Ergocalciferol) 50000 UNITS Caps   Commonly known as: DRISDOL   Take 50,000 Units by mouth 2 (two) times a week. Monday and Thursday            Follow-up Information    Follow up with Loanne Drilling, MD in 2 weeks. (Call tomorrow to make the appointment)    Contact information:   [facility] [address] [place] [address] [phone]          Signed: Patrica Duel 03/04/2012, 11:33 AM

## 2012-04-26 ENCOUNTER — Other Ambulatory Visit: Payer: Self-pay | Admitting: Family Medicine

## 2012-04-26 DIAGNOSIS — N508 Other specified disorders of male genital organs: Secondary | ICD-10-CM

## 2012-04-27 ENCOUNTER — Other Ambulatory Visit: Payer: Self-pay | Admitting: Family Medicine

## 2012-04-27 ENCOUNTER — Ambulatory Visit
Admission: RE | Admit: 2012-04-27 | Discharge: 2012-04-27 | Disposition: A | Discharge status: Home / Self Care | Payer: BC Managed Care – PPO | Source: Ambulatory Visit | Attending: Family Medicine | Admitting: Family Medicine

## 2012-04-27 DIAGNOSIS — N508 Other specified disorders of male genital organs: Secondary | ICD-10-CM

## 2012-09-24 ENCOUNTER — Telehealth: Payer: Self-pay | Admitting: Neurology

## 2012-10-18 NOTE — Telephone Encounter (Signed)
Duplicate phone note in error

## 2012-11-09 ENCOUNTER — Telehealth: Payer: Self-pay | Admitting: Diagnostic Neuroimaging

## 2012-11-15 NOTE — Telephone Encounter (Signed)
done

## 2012-11-26 ENCOUNTER — Telehealth: Payer: Self-pay | Admitting: Diagnostic Neuroimaging

## 2013-01-26 NOTE — Telephone Encounter (Signed)
appt scheduled with Dr Hosie Poisson

## 2013-01-31 ENCOUNTER — Ambulatory Visit (INDEPENDENT_AMBULATORY_CARE_PROVIDER_SITE_OTHER): Payer: BC Managed Care – PPO | Admitting: Neurology

## 2013-01-31 ENCOUNTER — Encounter: Payer: Self-pay | Admitting: Neurology

## 2013-01-31 VITALS — BP 131/78 | HR 83 | Ht 71.0 in | Wt 281.0 lb

## 2013-01-31 DIAGNOSIS — F09 Unspecified mental disorder due to known physiological condition: Secondary | ICD-10-CM

## 2013-01-31 DIAGNOSIS — R4189 Other symptoms and signs involving cognitive functions and awareness: Secondary | ICD-10-CM

## 2013-01-31 DIAGNOSIS — G47419 Narcolepsy without cataplexy: Secondary | ICD-10-CM | POA: Insufficient documentation

## 2013-01-31 NOTE — Patient Instructions (Addendum)
Overall you are doing fairly well but I do want to suggest a few things today:   Remember to drink plenty of fluid, eat healthy meals and do not skip any meals. Try to eat protein with a every meal and eat a healthy snack such as fruit or nuts in between meals. Try to keep a regular sleep-wake schedule and try to exercise daily, particularly in the form of walking, 20-30 minutes a day, if you can.   As far as your medications are concerned, I would like to suggest you follow up with your psychiatrist to see if you can taper off either the Xanax or Klonopin or try to taper down the Seroquel  As far as diagnostic testing: We would like to send you for a sleep study to determine if you may have narcolepsy  I would like to see you back in 6 months, sooner if we need to. Please call us with any interim questions, concerns, problems, updates or refill requests.   Please also call us for any test results so we can go over those with you on the phone.  My clinical assistant and will answer any of your questions and relay your messages to me and also relay most of my messages to you.   Our phone number is [phone]. We also have an after hours call service for urgent matters and there is a physician on-call for urgent questions. For any emergencies you know to call 911 or go to the nearest emergency room

## 2013-01-31 NOTE — Progress Notes (Addendum)
Provider:  Dr Hosie Poisson Referring Provider: Leanor Rubenstein, MD Primary Care Physician:  Leanor Rubenstein, MD  Chief Complaint  Patient presents with  . Memory Loss    # 16  Revisit -  Love    HPI:  Johnny [name] is a 52 y.o. male here as a follow up bipolar disorder and cognitive decline.  Overall:  Since last visit he believes he has had continued progressive cognitive decline. He notes increased difficulty with word finding, losing his thoughts mid sentence, trouble remembering passwords. He denies any difficulty with long-term memory. Continues to function at work though he reports he is having increasing difficulty due to his forgetfulness. Continues to no difficulties with activities of daily living. He is present today with his mother reports that she has not noticed any of his worsening memory problems. She does note that he does have occasional pauses and this became otherwise she feels his memory is unchanged.  He also reports continued episodes of brief myoclonic type twitching of the extremities. He has had episodes in the past continues to have them intermittently. He has a some continued difficulty with balance bumping into things feels off balance, denies any falls. He did not feel this has been progressing since his last visit.  He does note episodes where he will be talking and a tendency will fall asleep abruptly, he notes some fatigue easily startled but does not know that stronger motions cause him to have generalized or focal weakness.  He continues to follow up with psychiatry for management of his bipolar disorder. He is currently on Lamictal Trileptal Seroquel Remeron Klonopin Xanax and brintellix. He notes continues have difficulty with mood disorder most notably depression, reports days her except wishes he was not a lot. Denies any current suicidal ideation. Feels there are days where he just has no energy no motivation. Wakes up often feeling horrible and has trouble getting  going in the morning.  He had a Mini-Mental status exam today which was 30 out of 30, he had a formal neuro psych testing done in the past 8 months which showed mild cognitive impairment.  Concerns/Questions:Review of Systems: Out of a complete 14 system review, the patient complains of only the following symptoms, and all other reviewed systems are negative. Positive for fatigue number decline depression gait imbalance  History   Social History  . Marital Status: Married    Spouse Name: Johnny [name]    Number of Children: 2  . Years of Education: 12   Occupational History  . owner / Designer, television/film set    Social History Main Topics  . Smoking status: Never Smoker   . Smokeless tobacco: Never Used  . Alcohol Use: Yes     Comment: none in 24 yrs(hx. ETOH abuse)  . Drug Use: Yes    Special: Cocaine, Heroin, Marijuana     Comment: No use in 24 yrs  . Sexually Active: Yes   Other Topics Concern  . Not on file   Social History Narrative   Patient lives at home with his wife Johnny [name]). Patient self employed.   Both handed.   Caffeine- One cup daily    Family History  Problem Relation Age of Onset  . High blood pressure Mother   . Arthritis Father     Past Medical History  Diagnosis Date  . Fractures 02-09-12    hx. wrist/ ankle fx. in childhood  . Mental disorder 02-09-12    hx. Bipolar. -Dr. Kirtland Bouchard. [facility],psych(monthly)  . Depression   .  Bronchitis, allergic 02-09-12    hx. of this ,none recent  . Vertigo 02-09-12    hx. once.  . Arthritis 02-09-12    osteoarthritis-knee.  . Raynaud's syndrome 02-09-12    hx. bil. fingers    Past Surgical History  Procedure Laterality Date  . Knee arthroscopy  02-09-12    right knee scope   . Ganglion cyst excision  02-09-12    left forearm  . Hernia repair  02-09-12    right-open  . Total knee arthroplasty  02/20/2012    Procedure: TOTAL KNEE ARTHROPLASTY;  Surgeon: Loanne Drilling, MD;  Location: [facility];  Service: Orthopedics;  Laterality: Right;    Current  Outpatient Prescriptions  Medication Sig Dispense Refill  . mirtazapine (REMERON) 30 MG tablet Take 30 mg by mouth at bedtime.      . Vortioxetine HBr (BRINTELLIX) 5 MG TABS Take by mouth daily.      Marland Kitchen ALPRAZolam (XANAX) 0.5 MG tablet Take 0.5 mg by mouth daily.      . clonazePAM (KLONOPIN) 0.5 MG tablet Take 0.5-1 mg by mouth See admin instructions. 1 tablet once in am and 2 tablets at bedtime. Take med twice daily.      Marland Kitchen lamoTRIgine (LAMICTAL) 150 MG tablet Take 100 mg by mouth 4 (four) times daily.       . Oxcarbazepine (TRILEPTAL) 300 MG tablet Take 450 mg by mouth 2 (two) times daily.      . QUEtiapine (SEROQUEL XR) 300 MG 24 hr tablet Take 600 mg by mouth at bedtime.      . Vitamin D, Ergocalciferol, (DRISDOL) 50000 UNITS CAPS Take 50,000 Units by mouth 2 (two) times a week. Monday and Thursday       No current facility-administered medications for this visit.    Allergies as of 01/31/2013  . (No Known Allergies)    Vitals: BP 131/78  Pulse 83  Ht 5\' 11"  (1.803 m)  Wt 281 lb (127.461 kg)  BMI 39.21 kg/m2 Last Weight:  Wt Readings from Last 1 Encounters:  01/31/13 281 lb (127.461 kg)   Last Height:   Ht Readings from Last 1 Encounters:  01/31/13 5\' 11"  (1.803 m)     Physical exam: Exam: Gen: NAD, conversant Eyes: anicteric sclerae, moist conjunctivae HEENT: Atraumatic Lungs: CTA, no wheezing, rales, rhonci                          CV: RRR, no MRG Abdomen: Soft, non-tender;  Extremities: No peripheral edema  Skin: Normal temperature, no rash,  Psych: Appropriate affect, pleasant  Neuro: MS: AA&Ox3, appropriately interactive, normal affect   Attention: WORLD backwards  Speech: fluent w/o paraphasic error  Memory: good recent and remote recall  MMSE 30/30  CN: PERRL, EOMI no nystagmus, no ptosis, sensation intact to LT V1-V3 bilat, face symmetric, no weakness, hearing grossly intact, palate elevates symmetrically, shoulder shrug 5/5 bilat,  tongue  protrudes midline, no fasiculations noted.  Motor: normal bulk and tone Strength: 5/5  In all extremities  Coord: rapid alternating and point-to-point (FNF, HTS) movements intact.  Reflexes: symmetrical, bilat downgoing toes  Sens: LT intact in all extremities  Gait: posture, stance, stride and arm-swing normal. Able to tandem walk. Negative Romberg.   Assessment:  After physical and neurologic examination, review of laboratory studies, imaging, neurophysiology testing and pre-existing records, assessment will be reviewed on the problem list.  Plan:  Treatment plan and additional workup will be reviewed  under Problem List.  Mr. Schriefer is a pleasant 52 year old gentleman with subjective concerns of cognitive decline and a history of bipolar disorder for which she is followed by psychiatry. He returns today with concerns of progressive cognitive decline that is worse since last visit. He notes especially difficulty with short-term memory and word finding. His Mini-Mental status exam today is 30 out of 30 which is improved compared to his last visit of 28/30. He has also had formal neuropsychiatric evaluation showing mild to cognitive impairment. The patient suffers from severe depression along with his bipolar disorder and is currently on multiple psychiatric medications to help balance his mood, depression and anxiety.   On exam today he does note episodes of abruptly fallen asleep this is often occurred in mid sentence. Per his mom she has noticed these episdoes also. He reports having had sleep studies in the past to assess for sleep apnea, he denies ever being evaluated for narcolepsy. Reports his last sleep study 2 years ago not completed due to difficulty falling asleep.  #1 cognitive decline  -Clinically appears stable with noted slight improvement a Mini-Mental status exam.  -Suspect that this is probably due to his underlying psychiatric conditions and multiple medications he is on  for his mood disorder.  -Discussed with patient the potential benefit of tapering down on either the Klonopin or Xanax, or trying to taper down the Seroquel. Understand that this has to be balanced with the need to treat his underlying psychiatric condition. Suggested the patient that he follow up with his psychiatrist for further medic patient adjustment as tolerated.  --Copy note will be forwarded to patient's psychiatrist.  At this time do not see an indication to start patient on any medications for memory enhancement area patient states that he wishes to have a repeat formal neuropsychiatric testing done and he will likely have this done in December.  #2 Sleep disorder  -Question if symptoms due to narcolepsy vs multiple sedating medications  -Will refer patient to [facility] for formal testing  -Patient scheduled to follow up in 6 months or as needed  A total of 35 minutes was spent with this patient. Over half this time was spent in direct face-to-face consultation with patient and his mother. We discussed his underlying conditions and how it psychiatric condition may be causing some of his neurological symptoms. We discussed the potential workup for narcolepsy and the different therapeutic options if this is discovered. All questions were completely answered.   I have read the note, and I agree with the clinical assessment and plan. Lesly Dukes

## 2013-02-09 ENCOUNTER — Telehealth: Payer: Self-pay | Admitting: Neurology

## 2013-02-09 NOTE — Telephone Encounter (Signed)
ERROR - Pt does not wish to proceed with a sleep evaluation.

## 2013-03-21 ENCOUNTER — Telehealth: Payer: Self-pay | Admitting: Neurology

## 2013-03-21 NOTE — Telephone Encounter (Signed)
Your patient was contacted to set up an appointment for a sleep study to test for possible narcolepsy.  Pt says that he feels that he would know if he had narcolepsy.  He does not feel that this is his problem.  Feels that he is simply tired and that he is no more sleepy during the day than the average working male.  He declines sleep testing.  Pt was advised that we would send notification Dr. Hosie Poisson.

## 2013-08-26 NOTE — Telephone Encounter (Signed)
Pt came in for his visit, closing encounter °

## 2013-09-22 ENCOUNTER — Ambulatory Visit (HOSPITAL_COMMUNITY)
Admission: RE | Admit: 2013-09-22 | Discharge: 2013-09-22 | Disposition: A | Discharge status: Home / Self Care | Payer: BC Managed Care – PPO | Attending: Psychiatry | Admitting: Psychiatry

## 2013-11-10 ENCOUNTER — Ambulatory Visit (HOSPITAL_COMMUNITY): Payer: Self-pay | Admitting: Psychiatry

## 2014-08-07 DIAGNOSIS — R519 Headache, unspecified: Secondary | ICD-10-CM

## 2014-08-07 HISTORY — DX: Headache, unspecified: R51.9

## 2014-09-03 ENCOUNTER — Encounter (HOSPITAL_COMMUNITY): Payer: Self-pay | Admitting: Emergency Medicine

## 2014-09-03 ENCOUNTER — Emergency Department (HOSPITAL_COMMUNITY)
Admission: EM | Admit: 2014-09-03 | Discharge: 2014-09-03 | Disposition: A | Discharge status: Home / Self Care | Payer: BLUE CROSS/BLUE SHIELD | Attending: Emergency Medicine | Admitting: Emergency Medicine

## 2014-09-03 ENCOUNTER — Emergency Department (HOSPITAL_COMMUNITY): Payer: BLUE CROSS/BLUE SHIELD

## 2014-09-03 DIAGNOSIS — S199XXA Unspecified injury of neck, initial encounter: Secondary | ICD-10-CM | POA: Diagnosis present

## 2014-09-03 DIAGNOSIS — Y998 Other external cause status: Secondary | ICD-10-CM | POA: Diagnosis not present

## 2014-09-03 DIAGNOSIS — Y92410 Unspecified street and highway as the place of occurrence of the external cause: Secondary | ICD-10-CM | POA: Diagnosis not present

## 2014-09-03 DIAGNOSIS — Z79899 Other long term (current) drug therapy: Secondary | ICD-10-CM | POA: Diagnosis not present

## 2014-09-03 DIAGNOSIS — Z8781 Personal history of (healed) traumatic fracture: Secondary | ICD-10-CM | POA: Diagnosis not present

## 2014-09-03 DIAGNOSIS — Z8709 Personal history of other diseases of the respiratory system: Secondary | ICD-10-CM | POA: Insufficient documentation

## 2014-09-03 DIAGNOSIS — F319 Bipolar disorder, unspecified: Secondary | ICD-10-CM | POA: Diagnosis not present

## 2014-09-03 DIAGNOSIS — S299XXA Unspecified injury of thorax, initial encounter: Secondary | ICD-10-CM | POA: Insufficient documentation

## 2014-09-03 DIAGNOSIS — Z8679 Personal history of other diseases of the circulatory system: Secondary | ICD-10-CM | POA: Insufficient documentation

## 2014-09-03 DIAGNOSIS — S161XXA Strain of muscle, fascia and tendon at neck level, initial encounter: Secondary | ICD-10-CM | POA: Insufficient documentation

## 2014-09-03 DIAGNOSIS — Y9389 Activity, other specified: Secondary | ICD-10-CM | POA: Insufficient documentation

## 2014-09-03 DIAGNOSIS — R0789 Other chest pain: Secondary | ICD-10-CM

## 2014-09-03 DIAGNOSIS — M1712 Unilateral primary osteoarthritis, left knee: Secondary | ICD-10-CM | POA: Diagnosis not present

## 2014-09-03 MED ORDER — CYCLOBENZAPRINE HCL 10 MG PO TABS: 10.0000 mg | ORAL_TABLET | Freq: Two times a day (BID) | ORAL | Status: DC | PRN

## 2014-09-03 MED ORDER — NAPROXEN 500 MG PO TABS: 500.0000 mg | ORAL_TABLET | Freq: Two times a day (BID) | ORAL | Status: DC

## 2014-09-03 MED ORDER — OXYCODONE-ACETAMINOPHEN 5-325 MG PO TABS
1.0000 | ORAL_TABLET | ORAL | Status: DC | PRN
Start: 2014-09-03 — End: 2015-01-01

## 2014-09-03 MED ORDER — HYDROMORPHONE HCL 1 MG/ML IJ SOLN
1.0000 mg | Freq: Once | INTRAMUSCULAR | Status: AC
  Administered 2014-09-03: 1 mg via INTRAMUSCULAR
  Filled 2014-09-03: qty 1
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
## 2014-09-03 NOTE — Discharge Instructions (Signed)
Xrays of neck and chest were normal.  You will be sore for several days. Prescription for pain medicine and muscle relaxer

## 2014-09-13 ENCOUNTER — Other Ambulatory Visit: Payer: Self-pay | Admitting: Family Medicine

## 2014-09-13 ENCOUNTER — Ambulatory Visit
Admission: RE | Admit: 2014-09-13 | Discharge: 2014-09-13 | Disposition: A | Discharge status: Home / Self Care | Payer: BLUE CROSS/BLUE SHIELD | Source: Ambulatory Visit | Attending: Family Medicine | Admitting: Family Medicine

## 2014-09-13 DIAGNOSIS — R51 Headache: Principal | ICD-10-CM

## 2014-09-13 DIAGNOSIS — G8929 Other chronic pain: Secondary | ICD-10-CM

## 2014-09-14 ENCOUNTER — Other Ambulatory Visit: Payer: Self-pay

## 2014-09-15 ENCOUNTER — Other Ambulatory Visit: Payer: Self-pay | Admitting: Family Medicine

## 2014-09-15 DIAGNOSIS — R9389 Abnormal findings on diagnostic imaging of other specified body structures: Secondary | ICD-10-CM

## 2014-10-01 ENCOUNTER — Other Ambulatory Visit: Payer: Self-pay

## 2014-10-02 ENCOUNTER — Encounter: Payer: Self-pay | Admitting: Diagnostic Neuroimaging

## 2014-10-02 ENCOUNTER — Ambulatory Visit (INDEPENDENT_AMBULATORY_CARE_PROVIDER_SITE_OTHER): Payer: BLUE CROSS/BLUE SHIELD | Admitting: Diagnostic Neuroimaging

## 2014-10-02 VITALS — BP 160/99 | HR 96 | Ht 71.0 in | Wt 285.4 lb

## 2014-10-02 DIAGNOSIS — G44309 Post-traumatic headache, unspecified, not intractable: Secondary | ICD-10-CM | POA: Insufficient documentation

## 2014-10-02 DIAGNOSIS — F0781 Postconcussional syndrome: Secondary | ICD-10-CM | POA: Diagnosis not present

## 2014-10-02 NOTE — Patient Instructions (Signed)
Gradually increase activities.   Stay hydrated.    Focus on rest, relaxation, stretching exercises. Consider yoga stretching, acupuncture, massage therapy and physical therapy.
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
## 2014-12-13 ENCOUNTER — Ambulatory Visit: Payer: Self-pay | Admitting: Diagnostic Neuroimaging

## 2015-01-01 ENCOUNTER — Encounter: Payer: Self-pay | Admitting: Diagnostic Neuroimaging

## 2015-01-01 ENCOUNTER — Ambulatory Visit (INDEPENDENT_AMBULATORY_CARE_PROVIDER_SITE_OTHER): Payer: BLUE CROSS/BLUE SHIELD | Admitting: Diagnostic Neuroimaging

## 2015-01-01 VITALS — BP 133/81 | HR 93 | Ht 71.0 in | Wt 280.8 lb

## 2015-01-01 DIAGNOSIS — G44309 Post-traumatic headache, unspecified, not intractable: Secondary | ICD-10-CM | POA: Diagnosis not present

## 2015-01-01 MED ORDER — TOPIRAMATE 50 MG PO TABS: 50.0000 mg | ORAL_TABLET | Freq: Two times a day (BID) | ORAL | Status: DC

## 2015-01-01 MED ORDER — RIZATRIPTAN BENZOATE 10 MG PO TBDP: 10.0000 mg | ORAL_TABLET | ORAL | Status: DC | PRN

## 2015-01-01 NOTE — Patient Instructions (Signed)
Try topiramate 50mg  at bedtime to prevent headaches; after 2 weeks increase to twice a day.  Use rizatriptan as needed for breakthrough headaches; maximum 2 per day and 8 per month.
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
## 2015-02-07 ENCOUNTER — Telehealth: Payer: Self-pay | Admitting: Diagnostic Neuroimaging

## 2015-02-07 NOTE — Telephone Encounter (Signed)
I called back and spoke with the patient.  Says the TPX is making him lose concentration, and feel nauseated, sometime vomits.  Feels "spaced out".  He tried to reduce dose to just 50mg  daily, but that did not help.  Would like to know if a different medication could be recommended.  Please advise.  Thank you.

## 2015-02-07 NOTE — Telephone Encounter (Signed)
Patient is calling regarding medication topiramate (TOPAMAX) 50 MG tablet. The patient states he cannot function and feels like a zombie taking this medication .His head hurts so bad it makes him vomit.Can another medication be called in or what can he do? Patient would like a returned call to discuss. Patient uses [facility].Thank you.

## 2015-02-08 NOTE — Telephone Encounter (Signed)
Reduce topiramate to 50mg  qhs or stop topiramate for 1-2 weeks to see if symptoms resolve.

## 2015-02-08 NOTE — Telephone Encounter (Signed)
Spoke with patient and gave him Dr Gladstone Lighter specific directions re: Topiramate. He states he will "try that and call back with update". He expressed appreciation to Dr Leta Baptist and verbalized understanding of instructions.

## 2015-02-21 ENCOUNTER — Telehealth: Payer: Self-pay | Admitting: Diagnostic Neuroimaging

## 2015-02-21 NOTE — Telephone Encounter (Signed)
Spoke with patient and informed him, per Dr Leta Baptist that he may stop Topiramate and see if issues resolve. Dr Leta Baptist would prescribe Amitriptyline for his HA if okayed by patient's psychiatrist. Also inquired if patient has had sleep study. He states she has had 2 sleep studies and was told he has no sleep apnea issues. Informed patient that Dr Leta Baptist would see him for FU prior to his scheduled FU this Oct if patient prefers. Patient stated he will call his psychiatrist and discuss other medication, then he will call this office back.

## 2015-02-21 NOTE — Telephone Encounter (Signed)
Patient called said the topiramate (TOPAMAX) 50 MG tablet is still making him sick. He is taking it night. He feels foggy and dizzy in the morning and it takes about 2 hours to feel normal. He continues to have headaches. Please call and advise. He can be reached at [phone].

## 2015-02-22 NOTE — Telephone Encounter (Signed)
Stop TPX. Consider amitriptyline if psychiatry agrees.

## 2015-02-26 ENCOUNTER — Other Ambulatory Visit: Payer: Self-pay | Admitting: Gastroenterology

## 2015-04-06 ENCOUNTER — Telehealth: Payer: Self-pay | Admitting: Diagnostic Neuroimaging

## 2015-04-06 NOTE — Telephone Encounter (Signed)
Please call Dr. Lynder Parents [phone] in regard to patient. He is asking if there are any meds or supplements helpful for patients with traumatic brain injuries.  Thanks!

## 2015-04-09 ENCOUNTER — Encounter: Payer: Self-pay | Admitting: Diagnostic Neuroimaging

## 2015-04-09 ENCOUNTER — Ambulatory Visit (INDEPENDENT_AMBULATORY_CARE_PROVIDER_SITE_OTHER): Payer: BLUE CROSS/BLUE SHIELD | Admitting: Diagnostic Neuroimaging

## 2015-04-09 VITALS — BP 127/86 | HR 96 | Ht 71.0 in | Wt 266.2 lb

## 2015-04-09 DIAGNOSIS — F0781 Postconcussional syndrome: Secondary | ICD-10-CM

## 2015-04-09 DIAGNOSIS — G44309 Post-traumatic headache, unspecified, not intractable: Secondary | ICD-10-CM | POA: Diagnosis not present
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
## 2015-04-09 NOTE — Telephone Encounter (Signed)
No specific supplements advised. Can try amitriptyline for headaches and insomnia. -VRP

## 2015-04-09 NOTE — Patient Instructions (Signed)
Thank you for coming to see [name] at [facility]. I hope we have been able to provide you high quality care today.  You may receive a patient satisfaction survey over the next few weeks. We would appreciate your feedback and comments so that we may continue to improve ourselves and the health of our patients.  - continue gradually increasing physical activities - continue acupuncture treatments as directed by Dr. Nicoletta Dress   ~~~~~~~~~~~~~~~~~~~~~~~~~~~~~~~~~~~~~~~~~~~~~~~~~~~~~~~~~~~~~~~~~  DR. Khyrie Masi'S GUIDE TO HAPPY AND HEALTHY LIVING These are some of my general health and wellness recommendations. Some of them may apply to you better than others. Please use common sense as you try these suggestions and feel free to ask me any questions.   ACTIVITY/FITNESS Mental, social, emotional and physical stimulation are very important for brain and body health. Try learning a new activity (arts, music, language, sports, games).  Keep moving your body to the best of your abilities. You can do this at home, inside or outside, the park, community center, gym or anywhere you like. Consider a physical therapist or personal trainer to get started. Consider the app Sworkit. Fitness trackers such as smart-watches, smart-phones or Fitbits can help as well.   NUTRITION Eat more plants: colorful vegetables, nuts, seeds and berries.  Eat less sugar, salt, preservatives and processed foods.  Avoid toxins such as cigarettes and alcohol.  Drink water when you are thirsty. Warm water with a slice of lemon is an excellent morning drink to start the day.  Consider these websites for more information The Nutrition Source (https://www.henry-hernandez.biz/) Precision Nutrition (WindowBlog.ch)   RELAXATION Consider practicing mindfulness meditation or other relaxation techniques such as deep breathing, prayer, yoga, tai chi, massage. See website  mindful.org or the apps Headspace or Calm to help get started.   SLEEP Try to get at least 7-8+ hours sleep per day. Regular exercise and reduced caffeine will help you sleep better. Practice good sleep hygeine techniques. See website sleep.org for more information.   PLANNING Prepare estate planning, living will, healthcare POA documents. Sometimes this is best planned with the help of an attorney. Theconversationproject.org and agingwithdignity.org are excellent resources.

## 2015-04-09 NOTE — Telephone Encounter (Signed)
Spoke wit Dr Clovis Pu and informed him, per Dr Leta Baptist, he does not recommend any specific supplements but may try Amitriptyline for headaches, insomnia. Also informed Dr Clovis Pu patient has FU with Dr Leta Baptist later today. He verbalized understanding, appreciation for call back.

## 2015-05-21 ENCOUNTER — Other Ambulatory Visit (HOSPITAL_COMMUNITY): Payer: Self-pay | Admitting: Orthopedic Surgery

## 2015-05-21 DIAGNOSIS — M25561 Pain in right knee: Secondary | ICD-10-CM

## 2015-05-30 ENCOUNTER — Encounter (HOSPITAL_COMMUNITY)
Admission: RE | Admit: 2015-05-30 | Discharge: 2015-05-30 | Disposition: A | Discharge status: Home / Self Care | Payer: BLUE CROSS/BLUE SHIELD | Source: Ambulatory Visit | Attending: Orthopedic Surgery | Admitting: Orthopedic Surgery

## 2015-05-30 DIAGNOSIS — M25561 Pain in right knee: Secondary | ICD-10-CM | POA: Diagnosis not present

## 2015-05-30 MED ORDER — TECHNETIUM TC 99M MEDRONATE IV KIT
24.1000 | PACK | Freq: Once | INTRAVENOUS | Status: AC | PRN
  Administered 2015-05-30: 24.1 via INTRAVENOUS

## 2015-09-06 ENCOUNTER — Ambulatory Visit: Payer: Self-pay | Admitting: Orthopedic Surgery

## 2015-09-06 NOTE — Progress Notes (Signed)
Preoperative surgical orders have been place into the Epic hospital system for Johnny [name] on 09/06/2015, 10:31 AM  by Mickel Crow for surgery on 10-03-15.  Preop Total Knee orders including Experal, IV Tylenol, and IV Decadron as long as there are no contraindications to the above medications. Arlee Muslim, PA-C

## 2015-09-25 ENCOUNTER — Encounter (HOSPITAL_COMMUNITY)
Admission: RE | Admit: 2015-09-25 | Discharge: 2015-09-25 | Disposition: A | Discharge status: Home / Self Care | Payer: BLUE CROSS/BLUE SHIELD | Source: Ambulatory Visit | Attending: Orthopedic Surgery | Admitting: Orthopedic Surgery

## 2015-09-25 ENCOUNTER — Encounter (HOSPITAL_COMMUNITY): Payer: Self-pay

## 2015-09-25 DIAGNOSIS — T8489XA Other specified complication of internal orthopedic prosthetic devices, implants and grafts, initial encounter: Secondary | ICD-10-CM | POA: Insufficient documentation

## 2015-09-25 DIAGNOSIS — Y838 Other surgical procedures as the cause of abnormal reaction of the patient, or of later complication, without mention of misadventure at the time of the procedure: Secondary | ICD-10-CM | POA: Insufficient documentation

## 2015-09-25 DIAGNOSIS — Z96651 Presence of right artificial knee joint: Secondary | ICD-10-CM | POA: Diagnosis not present

## 2015-09-25 DIAGNOSIS — Z01812 Encounter for preprocedural laboratory examination: Secondary | ICD-10-CM | POA: Insufficient documentation

## 2015-09-25 DIAGNOSIS — Z0183 Encounter for blood typing: Secondary | ICD-10-CM | POA: Insufficient documentation

## 2015-09-25 HISTORY — DX: Headache: R51

## 2015-09-25 HISTORY — DX: Polyneuropathy, unspecified: G62.9

## 2015-09-25 LAB — COMPREHENSIVE METABOLIC PANEL
ALBUMIN: 4.2 g/dL (ref 3.5–5.0)
ALK PHOS: 49 U/L (ref 38–126)
ALT: 17 U/L (ref 17–63)
AST: 18 U/L (ref 15–41)
Anion gap: 9 (ref 5–15)
BILIRUBIN TOTAL: 0.4 mg/dL (ref 0.3–1.2)
BUN: 13 mg/dL (ref 6–20)
CALCIUM: 9.2 mg/dL (ref 8.9–10.3)
CO2: 26 mmol/L (ref 22–32)
CREATININE: 0.88 mg/dL (ref 0.61–1.24)
Chloride: 96 mmol/L — ABNORMAL LOW (ref 101–111)
GFR calc Af Amer: >60 mL/min (ref >60)
GFR calc non Af Amer: >60 mL/min (ref >60)
GLUCOSE: 100 mg/dL — AB (ref 65–99)
Potassium: 4 mmol/L (ref 3.5–5.1)
Sodium: 131 mmol/L — ABNORMAL LOW (ref 135–145)
TOTAL PROTEIN: 6.7 g/dL (ref 6.5–8.1)

## 2015-09-25 LAB — URINALYSIS, ROUTINE W REFLEX MICROSCOPIC
BILIRUBIN URINE: NEGATIVE
Glucose, UA: NEGATIVE mg/dL
Hgb urine dipstick: NEGATIVE
Ketones, ur: NEGATIVE mg/dL
Leukocytes, UA: NEGATIVE
NITRITE: NEGATIVE
Protein, ur: NEGATIVE mg/dL
SPECIFIC GRAVITY, URINE: 1.023 (ref 1.005–1.030)
pH: 7 (ref 5.0–8.0)

## 2015-09-25 LAB — CBC
HEMATOCRIT: 43.5 % (ref 39.0–52.0)
HEMOGLOBIN: 15.3 g/dL (ref 13.0–17.0)
MCH: 29.5 pg (ref 26.0–34.0)
MCHC: 35.2 g/dL (ref 30.0–36.0)
MCV: 84 fL (ref 78.0–100.0)
Platelets: 199 10*3/uL (ref 150–400)
RBC: 5.18 MIL/uL (ref 4.22–5.81)
RDW: 12.5 % (ref 11.5–15.5)
WBC: 4.3 10*3/uL (ref 4.0–10.5)

## 2015-09-25 LAB — PROTIME-INR
INR: 1 (ref 0.00–1.49)
Prothrombin Time: 13.4 seconds (ref 11.6–15.2)

## 2015-09-25 LAB — APTT: APTT: 37 s (ref 24–37)

## 2015-09-25 LAB — SURGICAL PCR SCREEN
MRSA, PCR: NEGATIVE
Staphylococcus aureus: NEGATIVE

## 2015-09-25 NOTE — Patient Instructions (Addendum)
Johnny [name]  09/25/2015   Your procedure is scheduled on: Wednesday  10/03/2015  Report to [facility] Main  Entrance take [place]  elevators to 3rd floor to  [place] at  [place] AM.  Call this number if you have problems the morning of surgery [phone]   Remember: ONLY 1 PERSON MAY GO WITH YOU TO SHORT STAY TO GET  READY MORNING OF [place].   Do not eat food or drink liquids :After Midnight.     Take these medicines the morning of surgery with A SIP OF WATER:  Lamotrigine (Lamictal), Oxcarbazepine (Trileptal), Ativan                                You may not have any metal on your body including hair pins and              piercings  Do not wear jewelry, make-up, lotions, powders or perfumes, deodorant                       Men may shave face and neck.   Do not bring valuables to the hospital. [place].  Contacts, dentures or bridgework may not be worn into surgery.  Leave suitcase in the car. After surgery it may be brought to your room.                  Please read over the following fact sheets you were given: _____________________________________________________________________             [facility] - Preparing for Surgery Before surgery, you can play an important role.  Because skin is not sterile, your skin needs to be as free of germs as possible.  You can reduce the number of germs on your skin by washing with CHG (chlorahexidine gluconate) soap before surgery.  CHG is an antiseptic cleaner which kills germs and bonds with the skin to continue killing germs even after washing. Please DO NOT use if you have an allergy to CHG or antibacterial soaps.  If your skin becomes reddened/irritated stop using the CHG and inform your nurse when you arrive at Short Stay. Do not shave (including legs and underarms) for at least 48 hours prior to the first CHG shower.  You may shave your  face/neck. Please follow these instructions carefully:  1.  Shower with CHG Soap the night before surgery and the  morning of Surgery.  2.  If you choose to wash your hair, wash your hair first as usual with your  normal  shampoo.  3.  After you shampoo, rinse your hair and body thoroughly to remove the  shampoo.                           4.  Use CHG as you would any other liquid soap.  You can apply chg directly  to the skin and wash                       Gently with a scrungie or clean washcloth.  5.  Apply the CHG Soap to your body ONLY FROM THE NECK DOWN.  Do not use on face/ open                           Wound or open sores. Avoid contact with eyes, ears mouth and genitals (private parts).                       Wash face,  Genitals (private parts) with your normal soap.             6.  Wash thoroughly, paying special attention to the area where your surgery  will be performed.  7.  Thoroughly rinse your body with warm water from the neck down.  8.  DO NOT shower/wash with your normal soap after using and rinsing off  the CHG Soap.                9.  Pat yourself dry with a clean towel.            10.  Wear clean pajamas.            11.  Place clean sheets on your bed the night of your first shower and do not  sleep with pets. Day of Surgery : Do not apply any lotions/deodorants the morning of surgery.  Please wear clean clothes to the hospital/surgery center.  FAILURE TO FOLLOW THESE INSTRUCTIONS MAY RESULT IN THE CANCELLATION OF YOUR SURGERY PATIENT SIGNATURE_________________________________  NURSE SIGNATURE__________________________________  ________________________________________________________________________   Adam Phenix  An incentive spirometer is a tool that can help keep your lungs clear and active. This tool measures how well you are filling your lungs with each breath. Taking long deep breaths may help reverse or decrease the chance of developing breathing  (pulmonary) problems (especially infection) following:  A long period of time when you are unable to move or be active. BEFORE THE PROCEDURE   If the spirometer includes an indicator to show your best effort, your nurse or respiratory therapist will set it to a desired goal.  If possible, sit up straight or lean slightly forward. Try not to slouch.  Hold the incentive spirometer in an upright position. INSTRUCTIONS FOR USE   Sit on the edge of your bed if possible, or sit up as far as you can in bed or on a chair.  Hold the incentive spirometer in an upright position.  Breathe out normally.  Place the mouthpiece in your mouth and seal your lips tightly around it.  Breathe in slowly and as deeply as possible, raising the piston or the ball toward the top of the column.  Hold your breath for 3-5 seconds or for as long as possible. Allow the piston or ball to fall to the bottom of the column.  Remove the mouthpiece from your mouth and breathe out normally.  Rest for a few seconds and repeat Steps 1 through 7 at least 10 times every 1-2 hours when you are awake. Take your time and take a few normal breaths between deep breaths.  The spirometer may include an indicator to show your best effort. Use the indicator as a goal to work toward during each repetition.  After each set of 10 deep breaths, practice coughing to be sure your lungs are clear. If you have an incision (the cut made at the time of surgery), support your incision when coughing by placing a pillow or rolled up towels firmly against it. Once you are able to get out of  bed, walk around indoors and cough well. You may stop using the incentive spirometer when instructed by your caregiver.  RISKS AND COMPLICATIONS  Take your time so you do not get dizzy or light-headed.  If you are in pain, you may need to take or ask for pain medication before doing incentive spirometry. It is harder to take a deep breath if you are having  pain. AFTER USE  Rest and breathe slowly and easily.  It can be helpful to keep track of a log of your progress. Your caregiver can provide you with a simple table to help with this. If you are using the spirometer at home, follow these instructions: [place] IF:   You are having difficultly using the spirometer.  You have trouble using the spirometer as often as instructed.  Your pain medication is not giving enough relief while using the spirometer.  You develop fever of 100.5 F (38.1 C) or higher. SEEK IMMEDIATE MEDICAL CARE IF:   You cough up bloody sputum that had not been present before.  You develop fever of 102 F (38.9 C) or greater.  You develop worsening pain at or near the incision site. MAKE SURE YOU:   Understand these instructions.  Will watch your condition.  Will get help right away if you are not doing well or get worse. Document Released: 11/03/2006 Document Revised: 09/15/2011 Document Reviewed: 01/04/2007 ExitCare Patient Information 2014 ExitCare, [place].   ________________________________________________________________________  WHAT IS A BLOOD TRANSFUSION? Blood Transfusion Information  A transfusion is the replacement of blood or some of its parts. Blood is made up of multiple cells which provide different functions.  Red blood cells carry oxygen and are used for blood loss replacement.  White blood cells fight against infection.  Platelets control bleeding.  Plasma helps clot blood.  Other blood products are available for specialized needs, such as hemophilia or other clotting disorders. BEFORE THE TRANSFUSION  Who gives blood for transfusions?   Healthy volunteers who are fully evaluated to make sure their blood is safe. This is blood bank blood. Transfusion therapy is the safest it has ever been in the practice of medicine. Before blood is taken from a donor, a complete history is taken to make sure that person has no history  of diseases nor engages in risky social behavior (examples are intravenous drug use or sexual activity with multiple partners). The donor's travel history is screened to minimize risk of transmitting infections, such as malaria. The donated blood is tested for signs of infectious diseases, such as HIV and hepatitis. The blood is then tested to be sure it is compatible with you in order to minimize the chance of a transfusion reaction. If you or a relative donates blood, this is often done in anticipation of surgery and is not appropriate for emergency situations. It takes many days to process the donated blood. RISKS AND COMPLICATIONS Although transfusion therapy is very safe and saves many lives, the main dangers of transfusion include:   Getting an infectious disease.  Developing a transfusion reaction. This is an allergic reaction to something in the blood you were given. Every precaution is taken to prevent this. The decision to have a blood transfusion has been considered carefully by your caregiver before blood is given. Blood is not given unless the benefits outweigh the risks. AFTER THE TRANSFUSION  Right after receiving a blood transfusion, you will usually feel much better and more energetic. This is especially true if your red blood  cells have gotten low (anemic). The transfusion raises the level of the red blood cells which carry oxygen, and this usually causes an energy increase.  The nurse administering the transfusion will monitor you carefully for complications. HOME CARE INSTRUCTIONS  No special instructions are needed after a transfusion. You may find your energy is better. Speak with your caregiver about any limitations on activity for underlying diseases you may have. SEEK MEDICAL CARE IF:   Your condition is not improving after your transfusion.  You develop redness or irritation at the intravenous (IV) site. SEEK IMMEDIATE MEDICAL CARE IF:  Any of the following symptoms  occur over the next 12 hours:  Shaking chills.  You have a temperature by mouth above 102 F (38.9 C), not controlled by medicine.  Chest, back, or muscle pain.  People around you feel you are not acting correctly or are confused.  Shortness of breath or difficulty breathing.  Dizziness and fainting.  You get a rash or develop hives.  You have a decrease in urine output.  Your urine turns a dark color or changes to pink, red, or brown. Any of the following symptoms occur over the next 10 days:  You have a temperature by mouth above 102 F (38.9 C), not controlled by medicine.  Shortness of breath.  Weakness after normal activity.  The white part of the eye turns yellow (jaundice).  You have a decrease in the amount of urine or are urinating less often.  Your urine turns a dark color or changes to pink, red, or brown. Document Released: 06/20/2000 Document Revised: 09/15/2011 Document Reviewed: 02/07/2008 [facility] Patient Information 2014 [place], [place].  _______________________________________________________________________

## 2015-09-27 NOTE — Progress Notes (Signed)
Fax received from Dr. Wynelle Link about CMP results sent to him on 09/25/2015- no action needed and Fax on chart.

## 2015-10-02 ENCOUNTER — Ambulatory Visit: Payer: Self-pay | Admitting: Orthopedic Surgery

## 2015-10-02 MED ORDER — DEXTROSE 5 % IV SOLN
3.0000 g | INTRAVENOUS | Status: AC
  Administered 2015-10-03: 3 g via INTRAVENOUS
  Filled 2015-10-02: qty 3000

## 2015-10-02 NOTE — H&P (Signed)
Johnny [name] DOB: Oct 05, 1960 Married / Language: English / Race: White Male Date of Admission:  10/03/2015 CC:  Unstable Right Knee History of Present Illness The patient is a 55 year old male who comes in for a preoperative History and Physical. The patient is scheduled for a right total knee arthroplasty (revision) to be performed by Dr. Dione Plover. [name], MD at [facility] on 10-03-2015. The patient is a 55 year old male who presented for follow up of their knee. The patient is being followed for their right knee pain and contusion. Symptoms reported include: pain, swelling, aching (worse posteriorly; and numbness occasionally anteriorly), stiffness, grinding and difficulty ambulating. The patient feels that they are doing poorly and report their pain level to be moderate to severe. Current treatment includes: relative rest (symptoms are flared by long distance walking) and icing. The following medication has been used for pain control: none. The patient presented following bone scan. The patient has reported improvement of their symptoms with: rest. He recently had the bone scan. The pain and instability are getting worse. Prior to the accident, his knee was completely asymptomatic. Since the accident, he has had progressively worsening pain as well as instability. His plain films were unremarkable. The bone scan was reviewed. He has got a significant amount of increased uptake, especially around the femoral component. This is quite abnormal for this amount of time postop. He has developed instability and also has a positive bone scan. All of this occurred since the accident. At this point, I think the only thing that is going to make him improve would be a revision. Either it is going to be revision to a thicker polyethylene if the components are stable or may need a full bone revision if there is any loosening of the components. I do feel that the bone scan activity could also be due to  inflammation from the instability. At this point, the most predictable means of improving pain and function is revision total knee arthroplasty. The procedure, risks, potential complications and rehab course are discussed in detail and the patient elects to proceed. Right knee pain. We discussed everything in detail. We are going to perform revision surgery whether that be just converting to a thicker polyethylene to address the instability or having to redo the entire prosthesis. All these options discussed in detail and he would like to go ahead and proceed. They have been treated conservatively in the past for the above stated problem and despite conservative measures, they continue to have progressive pain and severe functional limitations and dysfunction. They have failed non-operative management including home exercise, medications. It is felt that they would benefit from undergoing total joint replacement. Risks and benefits of the procedure have been discussed with the patient and they elect to proceed with surgery. There are no active contraindications to surgery such as ongoing infection or rapidly progressive neurological disease.   Problem List/Past Medical  Osteoarthritis, Knee (715.96)  Anxiety Disorder  Gastroesophageal Reflux Disease  Bipolar Disorder  Type 2 Peripheral Vascular Disease  Peripheral Neuropathy  Other disease, cancer, significant illness  Bi-polar type 2, G.A.D. , chronic depression, Vertigo  Mumps  Alcoholism  Depression  History of Chronic Bronchitis  History Of Measles  Pneumonia  History Of  Allergies No Known Drug Allergies   Family History  Father  Deceased, Lung Cancer. Hypertension  mother Other medical problems  my son Johnny [name] has Asperger syndrome and mild MR Depression  mother  Social History Living  situation  live with spouse Number of flights of stairs before winded  greater than 5 Marital status  married Children   2 Post-Surgical Plans  Plan is to go home. Tobacco use  Never smoker. 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 Trileptal (300MG  Tablet, Oral) Active. (and 1/2 of a 150mg  once in the am and once at night) SEROquel XR (300MG  Tablet ER 24HR, Oral) Active. Ativan (Oral) Specific strength unknown - Active. PriLOSEC (40MG  Capsule DR, Oral) Active.  Past Surgical History Inguinal Hernia Repair  open: right Total Knee Replacement - Right     Review of Systems General Not Present- Chills, Fatigue, Fever, Memory Loss, Night Sweats, Weight Gain and Weight Loss. Skin Not Present- Eczema, Hives, Itching, Lesions and Rash. HEENT Not Present- Dentures, Double Vision, Headache, Hearing Loss, Tinnitus and Visual Loss. Respiratory Not Present- Allergies, Chronic Cough, Coughing up blood, Shortness of breath at rest and Shortness of breath with exertion. Cardiovascular Not Present- Chest Pain, Difficulty Breathing Lying Down, Murmur, Palpitations, Racing/skipping heartbeats and Swelling. Gastrointestinal Not Present- Abdominal Pain, Bloody Stool, Constipation, Diarrhea, Difficulty Swallowing, Heartburn, Jaundice, Loss of appetitie, Nausea and Vomiting. Male Genitourinary Not Present- Blood in Urine, Discharge, Flank Pain, Incontinence, Painful Urination, Urgency, Urinary frequency, Urinary Retention, Urinating at Night and Weak urinary stream. Musculoskeletal Present- Joint Pain and Joint Swelling. Not Present- Back Pain, Morning Stiffness, Muscle Pain, Muscle Weakness and  Spasms. Neurological Not Present- Blackout spells, Difficulty with balance, Dizziness, Paralysis, Tremor and Weakness. Psychiatric Not Present- Insomnia.  Vitals  Weight: 262 lb Height: 70.75in Body Surface Area: 2.36 m Body Mass Index: 36.8 kg/m  BP: 155/90 (Sitting, Right Arm, Standard)   Physical Exam General Mental Status -Alert, cooperative and good historian. General Appearance-pleasant, Not in acute distress. Orientation-Oriented X3. Build & Nutrition-Well nourished and Well developed.  Head and Neck Head-normocephalic, atraumatic . Neck Global Assessment - supple, no bruit auscultated on the right, no bruit auscultated on the left.  Eye Pupil - Bilateral-Regular and Round. Motion - Bilateral-EOMI.  Chest and Lung Exam Auscultation Breath sounds - clear at anterior chest wall and clear at posterior chest wall. Adventitious sounds - No Adventitious sounds.  Cardiovascular Auscultation Rhythm - Regular rate and rhythm. Heart Sounds - S1 WNL and S2 WNL. Murmurs & Other Heart Sounds - Auscultation of the heart reveals - No Murmurs.  Abdomen Inspection Contour - Generalized mild distention. Palpation/Percussion Tenderness - Left Upper Quadrant(mild discomfort to deep palpation). Rigidity (guarding) - Abdomen is soft. Auscultation Auscultation of the abdomen reveals - Bowel sounds normal.  Male Genitourinary Note: Not done, not pertinent to present illness   Musculoskeletal Note: He is alert and oriented, in no apparent distress. Evaluation of his right knee shows no effusion. He ranges about 0 to 115 or 120. He has developed a fair amount of varus and valgus laxity in extension and AP laxity in flexion. This was not present prior to his accident.  The bone scan is reviewed. He has got a significant amount of increased uptake, especially around the femoral component. This is quite abnormal for this amount of time postop.  Assessment &  Plan Status post total right knee replacement [id number])  Note:Surgical Plans: Revision Right Total Knee  Disposition: Home  PCP: Dr. Pershing Cox - Patient has been seen preoperatively and felt to be stable for surgery.  IV TXA  Anesthesia Issues: None  Signed electronically by Ok Edwards, III PA-C

## 2015-10-03 ENCOUNTER — Inpatient Hospital Stay (HOSPITAL_COMMUNITY)
Admission: RE | Admit: 2015-10-03 | Discharge: 2015-10-05 | DRG: 468 | Disposition: A | Discharge status: Home Health | Payer: BLUE CROSS/BLUE SHIELD | Source: Ambulatory Visit | Attending: Orthopedic Surgery | Admitting: Orthopedic Surgery

## 2015-10-03 ENCOUNTER — Inpatient Hospital Stay (HOSPITAL_COMMUNITY): Payer: BLUE CROSS/BLUE SHIELD | Admitting: Anesthesiology

## 2015-10-03 ENCOUNTER — Encounter (HOSPITAL_COMMUNITY): Payer: Self-pay | Admitting: *Deleted

## 2015-10-03 ENCOUNTER — Encounter (HOSPITAL_COMMUNITY)
Admission: RE | Disposition: A | Discharge status: Home Health | Payer: Self-pay | Source: Ambulatory Visit | Attending: Orthopedic Surgery

## 2015-10-03 DIAGNOSIS — G43909 Migraine, unspecified, not intractable, without status migrainosus: Secondary | ICD-10-CM | POA: Diagnosis present

## 2015-10-03 DIAGNOSIS — T84092A Other mechanical complication of internal right knee prosthesis, initial encounter: Secondary | ICD-10-CM | POA: Diagnosis present

## 2015-10-03 DIAGNOSIS — Y792 Prosthetic and other implants, materials and accessory orthopedic devices associated with adverse incidents: Secondary | ICD-10-CM | POA: Diagnosis present

## 2015-10-03 DIAGNOSIS — G629 Polyneuropathy, unspecified: Secondary | ICD-10-CM | POA: Diagnosis present

## 2015-10-03 DIAGNOSIS — T84018A Broken internal joint prosthesis, other site, initial encounter: Secondary | ICD-10-CM

## 2015-10-03 DIAGNOSIS — I7300 Raynaud's syndrome without gangrene: Secondary | ICD-10-CM | POA: Diagnosis present

## 2015-10-03 DIAGNOSIS — M1711 Unilateral primary osteoarthritis, right knee: Secondary | ICD-10-CM | POA: Diagnosis present

## 2015-10-03 DIAGNOSIS — T84022A Instability of internal right knee prosthesis, initial encounter: Principal | ICD-10-CM | POA: Diagnosis present

## 2015-10-03 DIAGNOSIS — Z801 Family history of malignant neoplasm of trachea, bronchus and lung: Secondary | ICD-10-CM | POA: Diagnosis not present

## 2015-10-03 DIAGNOSIS — T84012A Broken internal right knee prosthesis, initial encounter: Secondary | ICD-10-CM

## 2015-10-03 DIAGNOSIS — F419 Anxiety disorder, unspecified: Secondary | ICD-10-CM | POA: Diagnosis present

## 2015-10-03 DIAGNOSIS — K219 Gastro-esophageal reflux disease without esophagitis: Secondary | ICD-10-CM | POA: Diagnosis present

## 2015-10-03 DIAGNOSIS — Z8249 Family history of ischemic heart disease and other diseases of the circulatory system: Secondary | ICD-10-CM | POA: Diagnosis not present

## 2015-10-03 DIAGNOSIS — F319 Bipolar disorder, unspecified: Secondary | ICD-10-CM | POA: Diagnosis present

## 2015-10-03 DIAGNOSIS — Z96659 Presence of unspecified artificial knee joint: Secondary | ICD-10-CM

## 2015-10-03 HISTORY — DX: Other specified postprocedural states: R11.2

## 2015-10-03 HISTORY — DX: Other specified postprocedural states: Z98.890

## 2015-10-03 HISTORY — PX: TOTAL KNEE REVISION WITH SCAR DEBRIDEMENT/PATELLA REVISION WITH POLY EXCHANGE: SHX6128

## 2015-10-03 LAB — TYPE AND SCREEN
ABO/RH(D): A POS
ANTIBODY SCREEN: NEGATIVE

## 2015-10-03 SURGERY — TOTAL KNEE REVISION WITH SCAR DEBRIDEMENT/PATELLA REVISION WITH POLY EXCHANGE
Anesthesia: General | Site: Knee | Laterality: Right

## 2015-10-03 MED ORDER — POLYETHYLENE GLYCOL 3350 17 G PO PACK: 17.0000 g | PACK | Freq: Every day | ORAL | Status: DC | PRN

## 2015-10-03 MED ORDER — OXCARBAZEPINE 300 MG PO TABS
450.0000 mg | ORAL_TABLET | Freq: Two times a day (BID) | ORAL | Status: DC
  Administered 2015-10-03 – 2015-10-05 (×4): 450 mg via ORAL
  Filled 2015-10-03 (×5): qty 1

## 2015-10-03 MED ORDER — MIDAZOLAM HCL 5 MG/5ML IJ SOLN
INTRAMUSCULAR | Status: DC | PRN
  Administered 2015-10-03: 2 mg via INTRAVENOUS

## 2015-10-03 MED ORDER — BUPIVACAINE LIPOSOME 1.3 % IJ SUSP
INTRAMUSCULAR | Status: DC | PRN
Start: 2015-10-03 — End: 2015-10-03
  Administered 2015-10-03: 20 mL

## 2015-10-03 MED ORDER — QUETIAPINE FUMARATE 400 MG PO TABS
800.0000 mg | ORAL_TABLET | Freq: Every day | ORAL | Status: DC
  Administered 2015-10-03 – 2015-10-04 (×2): 800 mg via ORAL
  Filled 2015-10-03 (×3): qty 2

## 2015-10-03 MED ORDER — DEXAMETHASONE SODIUM PHOSPHATE 10 MG/ML IJ SOLN
10.0000 mg | Freq: Once | INTRAMUSCULAR | Status: AC
  Administered 2015-10-04: 10 mg via INTRAVENOUS
  Filled 2015-10-03: qty 1

## 2015-10-03 MED ORDER — SODIUM CHLORIDE 0.9 % IJ SOLN
INTRAMUSCULAR | Status: AC
  Filled 2015-10-03: qty 50

## 2015-10-03 MED ORDER — MENTHOL 3 MG MT LOZG: 1.0000 | LOZENGE | OROMUCOSAL | Status: DC | PRN

## 2015-10-03 MED ORDER — LORAZEPAM 1 MG PO TABS
2.0000 mg | ORAL_TABLET | Freq: Every day | ORAL | Status: DC
  Administered 2015-10-03 – 2015-10-04 (×2): 2 mg via ORAL
  Filled 2015-10-03 (×2): qty 2

## 2015-10-03 MED ORDER — METHOCARBAMOL 1000 MG/10ML IJ SOLN
500.0000 mg | Freq: Four times a day (QID) | INTRAVENOUS | Status: DC | PRN
  Administered 2015-10-03: 500 mg via INTRAVENOUS
  Filled 2015-10-03 (×2): qty 5

## 2015-10-03 MED ORDER — HYDROMORPHONE HCL 1 MG/ML IJ SOLN
INTRAMUSCULAR | Status: AC
  Filled 2015-10-03: qty 1

## 2015-10-03 MED ORDER — CEFAZOLIN SODIUM-DEXTROSE 2-4 GM/100ML-% IV SOLN
2.0000 g | Freq: Four times a day (QID) | INTRAVENOUS | Status: AC
  Administered 2015-10-03 (×2): 2 g via INTRAVENOUS
  Filled 2015-10-03 (×2): qty 100

## 2015-10-03 MED ORDER — PROPOFOL 10 MG/ML IV BOLUS
INTRAVENOUS | Status: DC | PRN
  Administered 2015-10-03: 180 mg via INTRAVENOUS

## 2015-10-03 MED ORDER — FENTANYL CITRATE (PF) 100 MCG/2ML IJ SOLN
INTRAMUSCULAR | Status: DC | PRN
  Administered 2015-10-03 (×7): 50 ug via INTRAVENOUS

## 2015-10-03 MED ORDER — EPHEDRINE SULFATE 50 MG/ML IJ SOLN
INTRAMUSCULAR | Status: AC
  Filled 2015-10-03: qty 1

## 2015-10-03 MED ORDER — ACETAMINOPHEN 10 MG/ML IV SOLN
1000.0000 mg | Freq: Once | INTRAVENOUS | Status: AC
  Administered 2015-10-03: 1000 mg via INTRAVENOUS
  Filled 2015-10-03: qty 100

## 2015-10-03 MED ORDER — SUCCINYLCHOLINE CHLORIDE 20 MG/ML IJ SOLN
INTRAMUSCULAR | Status: DC | PRN
  Administered 2015-10-03: 100 mg via INTRAVENOUS

## 2015-10-03 MED ORDER — ACETAMINOPHEN 325 MG PO TABS
650.0000 mg | ORAL_TABLET | Freq: Four times a day (QID) | ORAL | Status: DC | PRN
  Administered 2015-10-05: 650 mg via ORAL
  Filled 2015-10-03: qty 2

## 2015-10-03 MED ORDER — RIVAROXABAN 10 MG PO TABS
10.0000 mg | ORAL_TABLET | Freq: Every day | ORAL | Status: DC
  Administered 2015-10-04 – 2015-10-05 (×2): 10 mg via ORAL
  Filled 2015-10-03 (×3): qty 1

## 2015-10-03 MED ORDER — ONDANSETRON HCL 4 MG/2ML IJ SOLN
INTRAMUSCULAR | Status: AC
  Filled 2015-10-03: qty 2

## 2015-10-03 MED ORDER — PANTOPRAZOLE SODIUM 40 MG PO TBEC
40.0000 mg | DELAYED_RELEASE_TABLET | Freq: Every day | ORAL | Status: DC
  Filled 2015-10-03: qty 1

## 2015-10-03 MED ORDER — ACETAMINOPHEN 10 MG/ML IV SOLN
INTRAVENOUS | Status: AC
  Filled 2015-10-03: qty 100

## 2015-10-03 MED ORDER — MIDAZOLAM HCL 2 MG/2ML IJ SOLN
INTRAMUSCULAR | Status: AC
  Filled 2015-10-03: qty 2

## 2015-10-03 MED ORDER — SODIUM CHLORIDE 0.9 % IV SOLN
INTRAVENOUS | Status: DC
  Administered 2015-10-03: 100 mL/h via INTRAVENOUS
  Administered 2015-10-04: 04:00:00 via INTRAVENOUS

## 2015-10-03 MED ORDER — DIPHENHYDRAMINE HCL 12.5 MG/5ML PO ELIX: 12.5000 mg | ORAL_SOLUTION | ORAL | Status: DC | PRN

## 2015-10-03 MED ORDER — TRANEXAMIC ACID 1000 MG/10ML IV SOLN
1000.0000 mg | INTRAVENOUS | Status: DC
  Filled 2015-10-03 (×2): qty 10

## 2015-10-03 MED ORDER — BUPIVACAINE LIPOSOME 1.3 % IJ SUSP
20.0000 mL | Freq: Once | INTRAMUSCULAR | Status: DC
  Filled 2015-10-03: qty 20

## 2015-10-03 MED ORDER — LAMOTRIGINE 100 MG PO TABS
100.0000 mg | ORAL_TABLET | Freq: Three times a day (TID) | ORAL | Status: DC
  Administered 2015-10-03 – 2015-10-05 (×6): 100 mg via ORAL
  Filled 2015-10-03 (×11): qty 1

## 2015-10-03 MED ORDER — FENTANYL CITRATE (PF) 100 MCG/2ML IJ SOLN
INTRAMUSCULAR | Status: AC
  Filled 2015-10-03: qty 2

## 2015-10-03 MED ORDER — DOCUSATE SODIUM 100 MG PO CAPS
100.0000 mg | ORAL_CAPSULE | Freq: Two times a day (BID) | ORAL | Status: DC
  Administered 2015-10-03 – 2015-10-05 (×4): 100 mg via ORAL

## 2015-10-03 MED ORDER — DEXAMETHASONE SODIUM PHOSPHATE 10 MG/ML IJ SOLN
INTRAMUSCULAR | Status: AC
  Filled 2015-10-03: qty 1

## 2015-10-03 MED ORDER — METOCLOPRAMIDE HCL 10 MG PO TABS: 5.0000 mg | ORAL_TABLET | Freq: Three times a day (TID) | ORAL | Status: DC | PRN

## 2015-10-03 MED ORDER — LIDOCAINE HCL (CARDIAC) 20 MG/ML IV SOLN
INTRAVENOUS | Status: AC
  Filled 2015-10-03: qty 5

## 2015-10-03 MED ORDER — BUPIVACAINE HCL (PF) 0.25 % IJ SOLN
INTRAMUSCULAR | Status: AC
  Filled 2015-10-03: qty 30

## 2015-10-03 MED ORDER — SODIUM CHLORIDE 0.9 % IJ SOLN
INTRAMUSCULAR | Status: DC | PRN
  Administered 2015-10-03: 30 mL

## 2015-10-03 MED ORDER — SODIUM CHLORIDE 0.9 % IJ SOLN
INTRAMUSCULAR | Status: AC
  Filled 2015-10-03: qty 10

## 2015-10-03 MED ORDER — LORAZEPAM 1 MG PO TABS
1.0000 mg | ORAL_TABLET | Freq: Two times a day (BID) | ORAL | Status: DC
  Administered 2015-10-04 – 2015-10-05 (×4): 1 mg via ORAL
  Filled 2015-10-03 (×4): qty 1

## 2015-10-03 MED ORDER — MORPHINE SULFATE (PF) 2 MG/ML IV SOLN
1.0000 mg | INTRAVENOUS | Status: DC | PRN
Start: 2015-10-03 — End: 2015-10-05
  Administered 2015-10-04: 1 mg via INTRAVENOUS
  Filled 2015-10-03: qty 1

## 2015-10-03 MED ORDER — LAMOTRIGINE 200 MG PO TABS
200.0000 mg | ORAL_TABLET | Freq: Every day | ORAL | Status: DC
  Administered 2015-10-03 – 2015-10-04 (×2): 200 mg via ORAL
  Filled 2015-10-03 (×3): qty 1

## 2015-10-03 MED ORDER — HYDROMORPHONE HCL 1 MG/ML IJ SOLN
0.2500 mg | INTRAMUSCULAR | Status: DC | PRN
  Administered 2015-10-03: 0.5 mg via INTRAVENOUS
  Administered 2015-10-03: 1 mg via INTRAVENOUS
  Administered 2015-10-03 (×3): 0.5 mg via INTRAVENOUS

## 2015-10-03 MED ORDER — LIDOCAINE HCL (CARDIAC) 20 MG/ML IV SOLN
INTRAVENOUS | Status: DC | PRN
  Administered 2015-10-03: 100 mg via INTRATRACHEAL

## 2015-10-03 MED ORDER — SODIUM CHLORIDE 0.9 % IV SOLN: INTRAVENOUS | Status: DC

## 2015-10-03 MED ORDER — FLEET ENEMA 7-19 GM/118ML RE ENEM: 1.0000 | ENEMA | Freq: Once | RECTAL | Status: DC | PRN

## 2015-10-03 MED ORDER — FENTANYL CITRATE (PF) 250 MCG/5ML IJ SOLN
INTRAMUSCULAR | Status: AC
  Filled 2015-10-03: qty 5

## 2015-10-03 MED ORDER — ACETAMINOPHEN 650 MG RE SUPP: 650.0000 mg | Freq: Four times a day (QID) | RECTAL | Status: DC | PRN

## 2015-10-03 MED ORDER — METHOCARBAMOL 500 MG PO TABS
500.0000 mg | ORAL_TABLET | Freq: Four times a day (QID) | ORAL | Status: DC | PRN
  Administered 2015-10-03 – 2015-10-05 (×4): 500 mg via ORAL
  Filled 2015-10-03 (×4): qty 1

## 2015-10-03 MED ORDER — CHLORHEXIDINE GLUCONATE 4 % EX LIQD: 60.0000 mL | Freq: Once | CUTANEOUS | Status: DC

## 2015-10-03 MED ORDER — ONDANSETRON HCL 4 MG PO TABS: 4.0000 mg | ORAL_TABLET | Freq: Four times a day (QID) | ORAL | Status: DC | PRN

## 2015-10-03 MED ORDER — ACETAMINOPHEN 500 MG PO TABS
1000.0000 mg | ORAL_TABLET | Freq: Four times a day (QID) | ORAL | Status: AC
  Administered 2015-10-03 – 2015-10-04 (×4): 1000 mg via ORAL
  Filled 2015-10-03 (×5): qty 2

## 2015-10-03 MED ORDER — EPHEDRINE SULFATE 50 MG/ML IJ SOLN
INTRAMUSCULAR | Status: DC | PRN
  Administered 2015-10-03: 10 mg via INTRAVENOUS
  Administered 2015-10-03: 15 mg via INTRAVENOUS

## 2015-10-03 MED ORDER — METOCLOPRAMIDE HCL 5 MG/ML IJ SOLN: 5.0000 mg | Freq: Three times a day (TID) | INTRAMUSCULAR | Status: DC | PRN

## 2015-10-03 MED ORDER — LACTATED RINGERS IV SOLN
INTRAVENOUS | Status: DC | PRN
  Administered 2015-10-03 (×3): via INTRAVENOUS

## 2015-10-03 MED ORDER — LACTATED RINGERS IV SOLN: INTRAVENOUS | Status: DC

## 2015-10-03 MED ORDER — 0.9 % SODIUM CHLORIDE (POUR BTL) OPTIME
TOPICAL | Status: DC | PRN
  Administered 2015-10-03: 1000 mL

## 2015-10-03 MED ORDER — BUPIVACAINE HCL 0.25 % IJ SOLN
INTRAMUSCULAR | Status: DC | PRN
  Administered 2015-10-03: 20 mL

## 2015-10-03 MED ORDER — TRANEXAMIC ACID 1000 MG/10ML IV SOLN
1000.0000 mg | Freq: Once | INTRAVENOUS | Status: AC
  Administered 2015-10-03: 1000 mg via INTRAVENOUS
  Filled 2015-10-03: qty 10

## 2015-10-03 MED ORDER — ONDANSETRON HCL 4 MG/2ML IJ SOLN
INTRAMUSCULAR | Status: DC | PRN
  Administered 2015-10-03: 4 mg via INTRAVENOUS

## 2015-10-03 MED ORDER — SODIUM CHLORIDE 0.9 % IR SOLN
Status: DC | PRN
  Administered 2015-10-03: 1000 mL

## 2015-10-03 MED ORDER — DEXAMETHASONE SODIUM PHOSPHATE 10 MG/ML IJ SOLN
10.0000 mg | Freq: Once | INTRAMUSCULAR | Status: AC
  Administered 2015-10-03: 10 mg via INTRAVENOUS

## 2015-10-03 MED ORDER — OXYCODONE HCL 5 MG PO TABS
5.0000 mg | ORAL_TABLET | ORAL | Status: DC | PRN
  Administered 2015-10-03 – 2015-10-04 (×5): 10 mg via ORAL
  Filled 2015-10-03 (×5): qty 2

## 2015-10-03 MED ORDER — PHENOL 1.4 % MT LIQD: 1.0000 | OROMUCOSAL | Status: DC | PRN

## 2015-10-03 MED ORDER — ONDANSETRON HCL 4 MG/2ML IJ SOLN: 4.0000 mg | Freq: Four times a day (QID) | INTRAMUSCULAR | Status: DC | PRN

## 2015-10-03 MED ORDER — STERILE WATER FOR IRRIGATION IR SOLN
Status: DC | PRN
  Administered 2015-10-03: 2000 mL

## 2015-10-03 MED ORDER — BISACODYL 10 MG RE SUPP: 10.0000 mg | Freq: Every day | RECTAL | Status: DC | PRN

## 2015-10-03 SURGICAL SUPPLY — 60 items
ADAPTER BOLT FEMORAL +2/-2 (Knees) ×2 IMPLANT
BANDAGE ACE 6X5 VEL STRL LF (GAUZE/BANDAGES/DRESSINGS) ×2 IMPLANT
BLADE SAG 18X100X1.27 (BLADE) ×2 IMPLANT
BLADE SAW SGTL 11.0X1.19X90.0M (BLADE) ×2 IMPLANT
BONE CEMENT GENTAMICIN (Cement) ×6 IMPLANT
CEMENT BONE GENTAMICIN 40 (Cement) ×3 IMPLANT
CLOTH BEACON ORANGE TIMEOUT ST (SAFETY) ×2 IMPLANT
COMP FEM CEM RT SZ4 (Orthopedic Implant) ×2 IMPLANT
COMPONENT FEM CEM RT SZ4 (Orthopedic Implant) ×1 IMPLANT
CUFF TOURN SGL QUICK 34 (TOURNIQUET CUFF) ×1
CUFF TRNQT CYL 34X4X40X1 (TOURNIQUET CUFF) ×1 IMPLANT
DRAPE U-SHAPE 47X51 STRL (DRAPES) ×2 IMPLANT
DRSG ADAPTIC 3X8 NADH LF (GAUZE/BANDAGES/DRESSINGS) ×2 IMPLANT
DRSG PAD ABDOMINAL 8X10 ST (GAUZE/BANDAGES/DRESSINGS) ×2 IMPLANT
DURAPREP 26ML APPLICATOR (WOUND CARE) ×2 IMPLANT
ELECT REM PT RETURN 9FT ADLT (ELECTROSURGICAL) ×2
ELECTRODE REM PT RTRN 9FT ADLT (ELECTROSURGICAL) ×1 IMPLANT
EVACUATOR 1/8 PVC DRAIN (DRAIN) ×2 IMPLANT
FACESHIELD WRAPAROUND (MASK) ×2 IMPLANT
FEMORAL ADAPTER (Orthopedic Implant) ×2 IMPLANT
GAUZE SPONGE 4X4 12PLY STRL (GAUZE/BANDAGES/DRESSINGS) ×2 IMPLANT
GLOVE BIO SURGEON STRL SZ7.5 (GLOVE) ×2 IMPLANT
GLOVE BIO SURGEON STRL SZ8 (GLOVE) ×2 IMPLANT
GLOVE BIOGEL PI IND STRL 7.0 (GLOVE) ×1 IMPLANT
GLOVE BIOGEL PI IND STRL 7.5 (GLOVE) ×3 IMPLANT
GLOVE BIOGEL PI IND STRL 8 (GLOVE) ×2 IMPLANT
GLOVE BIOGEL PI INDICATOR 7.0 (GLOVE) ×1
GLOVE BIOGEL PI INDICATOR 7.5 (GLOVE) ×3
GLOVE BIOGEL PI INDICATOR 8 (GLOVE) ×2
GLOVE SURG SS PI 7.0 STRL IVOR (GLOVE) ×2 IMPLANT
GLOVE SURG SS PI 7.5 STRL IVOR (GLOVE) ×4 IMPLANT
GOWN STRL REUS W/TWL LRG LVL3 (GOWN DISPOSABLE) ×4 IMPLANT
GOWN STRL REUS W/TWL XL LVL3 (GOWN DISPOSABLE) ×4 IMPLANT
HANDPIECE INTERPULSE COAX TIP (DISPOSABLE) ×1
IMMOBILIZER KNEE 20 (SOFTGOODS) ×2 IMPLANT
INSERT TIBIAL RP TCS SZ 4.0 (Knees) ×2 IMPLANT
MANIFOLD NEPTUNE II (INSTRUMENTS) ×2 IMPLANT
PACK TOTAL KNEE CUSTOM (KITS) ×2 IMPLANT
PAD ABD 8X10 STRL (GAUZE/BANDAGES/DRESSINGS) ×2 IMPLANT
PADDING CAST COTTON 6X4 STRL (CAST SUPPLIES) ×2 IMPLANT
POSITIONER SURGICAL ARM (MISCELLANEOUS) ×2 IMPLANT
RESTRICTOR CEMENT SZ 5 C-STEM (Cement) ×2 IMPLANT
SET HNDPC FAN SPRY TIP SCT (DISPOSABLE) ×1 IMPLANT
STEM FLUTED UNIV REV 75X20 (Stem) ×2 IMPLANT
STEM TIBIA PFC 13X30MM (Stem) ×2 IMPLANT
STRIP CLOSURE SKIN 1/2X4 (GAUZE/BANDAGES/DRESSINGS) ×4 IMPLANT
SUT MNCRL AB 4-0 PS2 18 (SUTURE) ×2 IMPLANT
SUT VIC AB 2-0 CT1 27 (SUTURE) ×3
SUT VIC AB 2-0 CT1 TAPERPNT 27 (SUTURE) ×3 IMPLANT
SUT VLOC 180 0 24IN GS25 (SUTURE) ×2 IMPLANT
SWAB COLLECTION DEVICE MRSA (MISCELLANEOUS) IMPLANT
SWAB CULTURE ESWAB REG 1ML (MISCELLANEOUS) IMPLANT
SYR 50ML LL SCALE MARK (SYRINGE) ×4 IMPLANT
TOWER CARTRIDGE SMART MIX (DISPOSABLE) ×2 IMPLANT
TRAY FOLEY W/METER SILVER 16FR (SET/KITS/TRAYS/PACK) ×2 IMPLANT
TRAY REVISION SZ 4 (Knees) ×2 IMPLANT
TRAY SLEEVE CEM ML (Knees) ×2 IMPLANT
TUBE KAMVAC SUCTION (TUBING) IMPLANT
WEDGE SIZE 4 10MM (Knees) ×4 IMPLANT
WRAP KNEE MAXI GEL POST OP (GAUZE/BANDAGES/DRESSINGS) ×2 IMPLANT

## 2015-10-03 NOTE — Anesthesia Postprocedure Evaluation (Signed)
Anesthesia Post Note  Patient: Johnny [name]  Procedure(s) Performed: Procedure(s) (LRB): RIGHT TOTAL KNEE ARTHROPLASTY REVISION  (Right)  Patient location during evaluation: PACU Anesthesia Type: General Level of consciousness: awake and alert Pain management: pain level controlled Vital Signs Assessment: post-procedure vital signs reviewed and stable Respiratory status: spontaneous breathing, nonlabored ventilation, respiratory function stable and patient connected to nasal cannula oxygen Cardiovascular status: blood pressure returned to baseline and stable Postop Assessment: no signs of nausea or vomiting Anesthetic complications: no    Last Vitals:  Filed Vitals:   10/03/15 1245 10/03/15 1300  BP: 157/103 166/104  Pulse: 86 87  Temp:    Resp: 14 13    Last Pain:  Filed Vitals:   10/03/15 1302  PainSc: 8                  Kaylinn Dedic J

## 2015-10-03 NOTE — Transfer of Care (Signed)
Immediate Anesthesia Transfer of Care Note  Patient: Johnny [name]  Procedure(s) Performed: Procedure(s): RIGHT TOTAL KNEE ARTHROPLASTY REVISION  (Right)  Patient Location: PACU  Anesthesia Type:General  Level of Consciousness: awake, alert , oriented and patient cooperative  Airway & Oxygen Therapy: Patient Spontanous Breathing and Patient connected to face mask oxygen  Post-op Assessment: Report given to RN and Post -op Vital signs reviewed and stable  Post vital signs: Reviewed and stable  Last Vitals:  Filed Vitals:   10/03/15 0736  BP: 121/78  Pulse: 97  Temp: 36.4 C  Resp: 18    Complications: No apparent anesthesia complications

## 2015-10-03 NOTE — Interval H&P Note (Signed)
History and Physical Interval Note:  10/03/2015 9:45 AM  Johnny [name]  has presented today for surgery, with the diagnosis of UNSTABLE RIGHT TOTAL KNEE ARTHROPLASTY   The various methods of treatment have been discussed with the patient and family. After consideration of risks, benefits and other options for treatment, the patient has consented to  Procedure(s): [place]  (Right) as a surgical intervention .  The patient's history has been reviewed, patient examined, no change in status, stable for surgery.  I have reviewed the patient's chart and labs.  Questions were answered to the patient's satisfaction.     Gearlean Alf

## 2015-10-03 NOTE — Brief Op Note (Signed)
10/03/2015  11:40 AM  PATIENT:  Johnny [name]  55 y.o. male  PRE-OPERATIVE DIAGNOSIS:  UNSTABLE RIGHT TOTAL KNEE ARTHROPLASTY   POST-OPERATIVE DIAGNOSIS:  UNSTABLE RIGHT TOTAL KNEE ARTHROPLASTY   PROCEDURE:  Procedure(s): RIGHT TOTAL KNEE ARTHROPLASTY REVISION  (Right)  SURGEON:  Surgeon(s) and Role:    * Gaynelle Arabian, MD - Primary  PHYSICIAN ASSISTANT:   ASSISTANTS: Arlee Muslim, PA-C   ANESTHESIA:   general  EBL:  Total I/O In: 1000 [I.V.:1000] Out: 350 [Urine:150; Blood:200]  BLOOD ADMINISTERED:none  DRAINS: (Medium ) Hemovact drain(s) in the right knee with  Suction Open   LOCAL MEDICATIONS USED:  MARCAINE     COUNTS:  YES  TOURNIQUET:   Total Tourniquet Time Documented: Thigh (Right) - 40 minutes Thigh (Right) - 23 minutes Total: Thigh (Right) - 63 minutes   DICTATION: .Other Dictation: Dictation Number [id number]  PLAN OF CARE: Admit to inpatient   PATIENT DISPOSITION:  PACU - hemodynamically stable.

## 2015-10-03 NOTE — Anesthesia Preprocedure Evaluation (Signed)
Anesthesia Evaluation  Patient identified by MRN, date of birth, ID band Patient awake    Reviewed: Allergy & Precautions, NPO status , Patient's Chart, lab work & pertinent test results  History of Anesthesia Complications (+) PONV and history of anesthetic complications  Airway Mallampati: II  TM Distance: >3 FB Neck ROM: Full    Dental no notable dental hx.    Pulmonary neg pulmonary ROS,    Pulmonary exam normal breath sounds clear to auscultation       Cardiovascular + Peripheral Vascular Disease  Normal cardiovascular exam Rhythm:Regular Rate:Normal     Neuro/Psych  Headaches, PSYCHIATRIC DISORDERS Anxiety Depression  Neuromuscular disease    GI/Hepatic negative GI ROS, Neg liver ROS,   Endo/Other  negative endocrine ROS  Renal/GU negative Renal ROS  negative genitourinary   Musculoskeletal  (+) Arthritis ,   Abdominal (+) + obese,   Peds negative pediatric ROS (+)  Hematology negative hematology ROS (+)   Anesthesia Other Findings   Reproductive/Obstetrics negative OB ROS                             Anesthesia Physical Anesthesia Plan  ASA: II  Anesthesia Plan: General   Post-op Pain Management:    Induction: Intravenous  Airway Management Planned: Oral ETT  Additional Equipment:   Intra-op Plan:   Post-operative Plan: Extubation in OR  Informed Consent: I have reviewed the patients History and Physical, chart, labs and discussed the procedure including the risks, benefits and alternatives for the proposed anesthesia with the patient or authorized representative who has indicated his/her understanding and acceptance.   Dental advisory given  Plan Discussed with: CRNA  Anesthesia Plan Comments: (Discussed r/b/a of general and spinal anesthesia. From the chart it appears he had a spinal in 2013, but the patient denies this and requests a general anesthetic today.  Questions answered.)        Anesthesia Quick Evaluation

## 2015-10-03 NOTE — Anesthesia Procedure Notes (Signed)
Procedure Name: Intubation Date/Time: 10/03/2015 9:55 AM Performed by: Dione Booze Pre-anesthesia Checklist: Patient identified, Emergency Drugs available, Suction available and Patient being monitored Patient Re-evaluated:Patient Re-evaluated prior to inductionOxygen Delivery Method: Circle system utilized Preoxygenation: Pre-oxygenation with 100% oxygen Intubation Type: IV induction Ventilation: Mask ventilation without difficulty Laryngoscope Size: Mac and 4 Grade View: Grade II Tube type: Oral Number of attempts: 1 Airway Equipment and Method: Stylet Placement Confirmation: ETT inserted through vocal cords under direct vision,  breath sounds checked- equal and bilateral and positive ETCO2 Secured at: 22 cm Tube secured with: Tape Dental Injury: Teeth and Oropharynx as per pre-operative assessment

## 2015-10-03 NOTE — H&P (View-Only) (Signed)
Johnny [name] DOB: 16-Jun-1961 Married / Language: English / Race: White Male Date of Admission:  10/03/2015 CC:  Unstable Right Knee History of Present Illness The patient is a 55 year old male who comes in for a preoperative History and Physical. The patient is scheduled for a right total knee arthroplasty (revision) to be performed by Dr. Dione Plover. [name], MD at [facility] on 10-03-2015. The patient is a 55 year old male who presented for follow up of their knee. The patient is being followed for their right knee pain and contusion. Symptoms reported include: pain, swelling, aching (worse posteriorly; and numbness occasionally anteriorly), stiffness, grinding and difficulty ambulating. The patient feels that they are doing poorly and report their pain level to be moderate to severe. Current treatment includes: relative rest (symptoms are flared by long distance walking) and icing. The following medication has been used for pain control: none. The patient presented following bone scan. The patient has reported improvement of their symptoms with: rest. He recently had the bone scan. The pain and instability are getting worse. Prior to the accident, his knee was completely asymptomatic. Since the accident, he has had progressively worsening pain as well as instability. His plain films were unremarkable. The bone scan was reviewed. He has got a significant amount of increased uptake, especially around the femoral component. This is quite abnormal for this amount of time postop. He has developed instability and also has a positive bone scan. All of this occurred since the accident. At this point, I think the only thing that is going to make him improve would be a revision. Either it is going to be revision to a thicker polyethylene if the components are stable or may need a full bone revision if there is any loosening of the components. I do feel that the bone scan activity could also be due to  inflammation from the instability. At this point, the most predictable means of improving pain and function is revision total knee arthroplasty. The procedure, risks, potential complications and rehab course are discussed in detail and the patient elects to proceed. Right knee pain. We discussed everything in detail. We are going to perform revision surgery whether that be just converting to a thicker polyethylene to address the instability or having to redo the entire prosthesis. All these options discussed in detail and he would like to go ahead and proceed. They have been treated conservatively in the past for the above stated problem and despite conservative measures, they continue to have progressive pain and severe functional limitations and dysfunction. They have failed non-operative management including home exercise, medications. It is felt that they would benefit from undergoing total joint replacement. Risks and benefits of the procedure have been discussed with the patient and they elect to proceed with surgery. There are no active contraindications to surgery such as ongoing infection or rapidly progressive neurological disease.   Problem List/Past Medical  Osteoarthritis, Knee (715.96)  Anxiety Disorder  Gastroesophageal Reflux Disease  Bipolar Disorder  Type 2 Peripheral Vascular Disease  Peripheral Neuropathy  Other disease, cancer, significant illness  Bi-polar type 2, G.A.D. , chronic depression, Vertigo  Mumps  Alcoholism  Depression  History of Chronic Bronchitis  History Of Measles  Pneumonia  History Of  Allergies No Known Drug Allergies   Family History  Father  Deceased, Lung Cancer. Hypertension  mother Other medical problems  my son Johnny [name] has Asperger syndrome and mild MR Depression  mother  Social History Living  situation  live with spouse Number of flights of stairs before winded  greater than 5 Marital status  married Children   2 Post-Surgical Plans  Plan is to go home. Tobacco use  Never smoker. 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 Trileptal (300MG  Tablet, Oral) Active. (and 1/2 of a 150mg  once in the am and once at night) SEROquel XR (300MG  Tablet ER 24HR, Oral) Active. Ativan (Oral) Specific strength unknown - Active. PriLOSEC (40MG  Capsule DR, Oral) Active.  Past Surgical History Inguinal Hernia Repair  open: right Total Knee Replacement - Right     Review of Systems General Not Present- Chills, Fatigue, Fever, Memory Loss, Night Sweats, Weight Gain and Weight Loss. Skin Not Present- Eczema, Hives, Itching, Lesions and Rash. HEENT Not Present- Dentures, Double Vision, Headache, Hearing Loss, Tinnitus and Visual Loss. Respiratory Not Present- Allergies, Chronic Cough, Coughing up blood, Shortness of breath at rest and Shortness of breath with exertion. Cardiovascular Not Present- Chest Pain, Difficulty Breathing Lying Down, Murmur, Palpitations, Racing/skipping heartbeats and Swelling. Gastrointestinal Not Present- Abdominal Pain, Bloody Stool, Constipation, Diarrhea, Difficulty Swallowing, Heartburn, Jaundice, Loss of appetitie, Nausea and Vomiting. Male Genitourinary Not Present- Blood in Urine, Discharge, Flank Pain, Incontinence, Painful Urination, Urgency, Urinary frequency, Urinary Retention, Urinating at Night and Weak urinary stream. Musculoskeletal Present- Joint Pain and Joint Swelling. Not Present- Back Pain, Morning Stiffness, Muscle Pain, Muscle Weakness and  Spasms. Neurological Not Present- Blackout spells, Difficulty with balance, Dizziness, Paralysis, Tremor and Weakness. Psychiatric Not Present- Insomnia.  Vitals  Weight: 262 lb Height: 70.75in Body Surface Area: 2.36 m Body Mass Index: 36.8 kg/m  BP: 155/90 (Sitting, Right Arm, Standard)   Physical Exam General Mental Status -Alert, cooperative and good historian. General Appearance-pleasant, Not in acute distress. Orientation-Oriented X3. Build & Nutrition-Well nourished and Well developed.  Head and Neck Head-normocephalic, atraumatic . Neck Global Assessment - supple, no bruit auscultated on the right, no bruit auscultated on the left.  Eye Pupil - Bilateral-Regular and Round. Motion - Bilateral-EOMI.  Chest and Lung Exam Auscultation Breath sounds - clear at anterior chest wall and clear at posterior chest wall. Adventitious sounds - No Adventitious sounds.  Cardiovascular Auscultation Rhythm - Regular rate and rhythm. Heart Sounds - S1 WNL and S2 WNL. Murmurs & Other Heart Sounds - Auscultation of the heart reveals - No Murmurs.  Abdomen Inspection Contour - Generalized mild distention. Palpation/Percussion Tenderness - Left Upper Quadrant(mild discomfort to deep palpation). Rigidity (guarding) - Abdomen is soft. Auscultation Auscultation of the abdomen reveals - Bowel sounds normal.  Male Genitourinary Note: Not done, not pertinent to present illness   Musculoskeletal Note: He is alert and oriented, in no apparent distress. Evaluation of his right knee shows no effusion. He ranges about 0 to 115 or 120. He has developed a fair amount of varus and valgus laxity in extension and AP laxity in flexion. This was not present prior to his accident.  The bone scan is reviewed. He has got a significant amount of increased uptake, especially around the femoral component. This is quite abnormal for this amount of time postop.  Assessment &  Plan Status post total right knee replacement [id number]  Note:Surgical Plans: Revision Right Total Knee  Disposition: Home  PCP: Dr. Pershing Cox - Patient has been seen preoperatively and felt to be stable for surgery.  IV TXA  Anesthesia Issues: None  Signed electronically by Ok Edwards, III PA-C

## 2015-10-04 LAB — BASIC METABOLIC PANEL
ANION GAP: 8 (ref 5–15)
BUN: 10 mg/dL (ref 6–20)
CHLORIDE: 101 mmol/L (ref 101–111)
CO2: 29 mmol/L (ref 22–32)
Calcium: 8.8 mg/dL — ABNORMAL LOW (ref 8.9–10.3)
Creatinine, Ser: 0.94 mg/dL (ref 0.61–1.24)
Glucose, Bld: 141 mg/dL — ABNORMAL HIGH (ref 65–99)
POTASSIUM: 4 mmol/L (ref 3.5–5.1)
SODIUM: 138 mmol/L (ref 135–145)

## 2015-10-04 LAB — CBC
HCT: 41.7 % (ref 39.0–52.0)
HEMOGLOBIN: 14.2 g/dL (ref 13.0–17.0)
MCH: 29.8 pg (ref 26.0–34.0)
MCHC: 34.1 g/dL (ref 30.0–36.0)
MCV: 87.6 fL (ref 78.0–100.0)
PLATELETS: 207 10*3/uL (ref 150–400)
RBC: 4.76 MIL/uL (ref 4.22–5.81)
RDW: 13.2 % (ref 11.5–15.5)
WBC: 10.4 10*3/uL (ref 4.0–10.5)

## 2015-10-04 MED ORDER — OXYCODONE HCL 5 MG PO TABS
10.0000 mg | ORAL_TABLET | ORAL | Status: DC | PRN
  Administered 2015-10-04 (×3): 15 mg via ORAL
  Administered 2015-10-04: 20 mg via ORAL
  Administered 2015-10-04 – 2015-10-05 (×4): 15 mg via ORAL
  Administered 2015-10-05: 5 mg via ORAL
  Administered 2015-10-05: 15 mg via ORAL
  Filled 2015-10-04 (×2): qty 3
  Filled 2015-10-04: qty 4
  Filled 2015-10-04: qty 2
  Filled 2015-10-04 (×5): qty 3
  Filled 2015-10-04: qty 4

## 2015-10-04 MED ORDER — OMEPRAZOLE 20 MG PO CPDR
20.0000 mg | DELAYED_RELEASE_CAPSULE | Freq: Every day | ORAL | Status: DC | PRN
  Filled 2015-10-04: qty 1
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
## 2015-10-04 NOTE — Care Management Note (Signed)
Case Management Note  Patient Details  Name: Johnny [name] MRN: [id number] Date of Birth: September 29, 1960  Subjective/Objective:                  R TKR Action/Plan: Discharge planning Expected Discharge Date:                  Expected Discharge Plan:  [place]  In-House Referral:     Discharge planning Services  CM Consult  Post Acute Care Choice:  Home Health Choice offered to:  Patient  DME Arranged:  N/A DME Agency:  NA  HH Arranged:  PT HH Agency:  [place]  Status of Service:  Completed, signed off  Medicare Important Message Given:    Date Medicare IM Given:    Medicare IM give by:    Date Additional Medicare IM Given:    Additional Medicare Important Message give by:     If discussed at [place] of Stay Meetings, dates discussed:    Additional Comments: CM met with pt in room to offer choice of home health agency.  Pt chooses [facility] to render HHPT.  Referral called to [employer], Tim.  Pt has rolling walker and no need for 3n1.  No other CM needs were communicated. Dellie Catholic, RN 10/04/2015, 2:14 PM
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
## 2015-10-04 NOTE — Discharge Instructions (Addendum)
° °Dr. Frank [name] °Total Joint Specialist °[facility] °[address] °[place], [place] [address] °[phone] ° °TOTAL KNEE REPLACEMENT POSTOPERATIVE DIRECTIONS ° °Knee Rehabilitation, Guidelines Following Surgery  °Results after knee surgery are often greatly improved when you follow the exercise, range of motion and muscle strengthening exercises prescribed by your doctor. Safety measures are also important to protect the knee from further injury. Any time any of these exercises cause you to have increased pain or swelling in your knee joint, decrease the amount until you are comfortable again and slowly increase them. If you have problems or questions, call your caregiver or physical therapist for advice.  ° °HOME CARE INSTRUCTIONS  °Remove items at home which could result in a fall. This includes throw rugs or furniture in walking pathways.  °· ICE to the affected knee every three hours for 30 minutes at a time and then as needed for pain and swelling.  Continue to use ice on the knee for pain and swelling from surgery. You may notice swelling that will progress down to the foot and ankle.  This is normal after surgery.  Elevate the leg when you are not up walking on it.   °· Continue to use the breathing machine which will help keep your temperature down.  It is common for your temperature to cycle up and down following surgery, especially at night when you are not up moving around and exerting yourself.  The breathing machine keeps your lungs expanded and your temperature down. °· Do not place pillow under knee, focus on keeping the knee straight while resting ° °DIET °You may resume your previous home diet once your are discharged from the hospital. ° °DRESSING / WOUND CARE / SHOWERING °You may shower 3 days after surgery, but keep the wounds dry during showering.  You may use an occlusive plastic wrap (Press'n Seal for example), NO SOAKING/SUBMERGING IN THE BATHTUB.  If the  bandage gets wet, change with a clean dry gauze.  If the incision gets wet, pat the wound dry with a clean towel. °You may start showering once you are discharged home but do not submerge the incision under water. Just pat the incision dry and apply a dry gauze dressing on daily. °Change the surgical dressing daily and reapply a dry dressing each time. ° °ACTIVITY °Walk with your walker as instructed. °Use walker as long as suggested by your caregivers. °Avoid periods of inactivity such as sitting longer than an hour when not asleep. This helps prevent blood clots.  °You may resume a sexual relationship in one month or when given the OK by your doctor.  °You may return to work once you are cleared by your doctor.  °Do not drive a car for 6 weeks or until released by you surgeon.  °Do not drive while taking narcotics. ° °WEIGHT BEARING °Weight bearing as tolerated with assist device (walker, cane, etc) as directed, use it as long as suggested by your surgeon or therapist, typically at least 4-6 weeks. ° °POSTOPERATIVE CONSTIPATION PROTOCOL °Constipation - defined medically as fewer than three stools per week and severe constipation as less than one stool per week. ° °One of the most common issues patients have following surgery is constipation.  Even if you have a regular bowel pattern at home, your normal regimen is likely to be disrupted due to multiple reasons following surgery.  Combination of anesthesia, postoperative narcotics, change in appetite and fluid intake all can affect your bowels.    In order to avoid complications following surgery, here are some recommendations in order to help you during your recovery period. ° °Colace (docusate) - Pick up an over-the-counter form of Colace or another stool softener and take twice a day as long as you are requiring postoperative pain medications.  Take with a full glass of water daily.  If you experience loose stools or diarrhea, hold the colace until you stool forms  back up.  If your symptoms do not get better within 1 week or if they get worse, check with your doctor. ° °Dulcolax (bisacodyl) - Pick up over-the-counter and take as directed by the product packaging as needed to assist with the movement of your bowels.  Take with a full glass of water.  Use this product as needed if not relieved by Colace only.  ° °MiraLax (polyethylene glycol) - Pick up over-the-counter to have on hand.  MiraLax is a solution that will increase the amount of water in your bowels to assist with bowel movements.  Take as directed and can mix with a glass of water, juice, soda, coffee, or tea.  Take if you go more than two days without a movement. °Do not use MiraLax more than once per day. Call your doctor if you are still constipated or irregular after using this medication for 7 days in a row. ° °If you continue to have problems with postoperative constipation, please contact the office for further assistance and recommendations.  If you experience "the worst abdominal pain ever" or develop nausea or vomiting, please contact the office immediatly for further recommendations for treatment. ° °ITCHING ° If you experience itching with your medications, try taking only a single pain pill, or even half a pain pill at a time.  You can also use Benadryl over the counter for itching or also to help with sleep.  ° °TED HOSE STOCKINGS °Wear the elastic stockings on both legs for three weeks following surgery during the day but you may remove then at night for sleeping. ° °MEDICATIONS °See your medication summary on the “After Visit Summary” that the nursing staff will review with you prior to discharge.  You may have some home medications which will be placed on hold until you complete the course of blood thinner medication.  It is important for you to complete the blood thinner medication as prescribed by your surgeon.  Continue your approved medications as instructed at time of  discharge. ° °PRECAUTIONS °If you experience chest pain or shortness of breath - call 911 immediately for transfer to the hospital emergency department.  °If you develop a fever greater that 101 F, purulent drainage from wound, increased redness or drainage from wound, foul odor from the wound/dressing, or calf pain - CONTACT YOUR SURGEON.   °                                                °FOLLOW-UP APPOINTMENTS °Make sure you keep all of your appointments after your operation with your surgeon and caregivers. You should call the office at the above phone number and make an appointment for approximately two weeks after the date of your surgery or on the date instructed by your surgeon outlined in the "After Visit Summary". ° ° °RANGE OF MOTION AND STRENGTHENING EXERCISES  °Rehabilitation of the knee is important following a knee injury or   an operation. After just a few days of immobilization, the muscles of the thigh which control the knee become weakened and shrink (atrophy). Knee exercises are designed to build up the tone and strength of the thigh muscles and to improve knee motion. Often times heat used for twenty to thirty minutes before working out will loosen up your tissues and help with improving the range of motion but do not use heat for the first two weeks following surgery. These exercises can be done on a training (exercise) mat, on the floor, on a table or on a bed. Use what ever works the best and is most comfortable for you Knee exercises include:  °Leg Lifts - While your knee is still immobilized in a splint or cast, you can do straight leg raises. Lift the leg to 60 degrees, hold for 3 sec, and slowly lower the leg. Repeat 10-20 times 2-3 times daily. Perform this exercise against resistance later as your knee gets better.  °Quad and Hamstring Sets - Tighten up the muscle on the front of the thigh (Quad) and hold for 5-10 sec. Repeat this 10-20 times hourly. Hamstring sets are done by pushing the  foot backward against an object and holding for 5-10 sec. Repeat as with quad sets.  °· Leg Slides: Lying on your back, slowly slide your foot toward your buttocks, bending your knee up off the floor (only go as far as is comfortable). Then slowly slide your foot back down until your leg is flat on the floor again. °· Angel Wings: Lying on your back spread your legs to the side as far apart as you can without causing discomfort.  °A rehabilitation program following serious knee injuries can speed recovery and prevent re-injury in the future due to weakened muscles. Contact your doctor or a physical therapist for more information on knee rehabilitation.  ° °IF YOU ARE TRANSFERRED TO A SKILLED REHAB FACILITY °If the patient is transferred to a skilled rehab facility following release from the hospital, a list of the current medications will be sent to the facility for the patient to continue.  When discharged from the skilled rehab facility, please have the facility set up the patient's Home Health Physical Therapy prior to being released. Also, the skilled facility will be responsible for providing the patient with their medications at time of release from the facility to include their pain medication, the muscle relaxants, and their blood thinner medication. If the patient is still at the rehab facility at time of the two week follow up appointment, the skilled rehab facility will also need to assist the patient in arranging follow up appointment in our office and any transportation needs. ° °MAKE SURE YOU:  °Understand these instructions.  °Get help right away if you are not doing well or get worse.  ° ° °Pick up stool softner and laxative for home use following surgery while on pain medications. °Do not submerge incision under water. °Please use good hand washing techniques while changing dressing each day. °May shower starting three days after surgery. °Please use a clean towel to pat the incision dry following  showers. °Continue to use ice for pain and swelling after surgery. °Do not use any lotions or creams on the incision until instructed by your surgeon. ° °Take Xarelto for two and a half more weeks, then discontinue Xarelto. °Once the patient has completed the blood thinner regimen, then take a Baby 81 mg Aspirin daily for three more weeks. ° ° °Information   on my medicine - XARELTO® (Rivaroxaban) ° °This medication education was reviewed with me or my healthcare representative as part of my discharge preparation.  The pharmacist that spoke with me during my hospital stay was:  Runyon, Amanda, RPH ° °Why was Xarelto® prescribed for you? °Xarelto® was prescribed for you to reduce the risk of blood clots forming after orthopedic surgery. The medical term for these abnormal blood clots is venous thromboembolism (VTE). ° °What do you need to know about xarelto® ? °Take your Xarelto® ONCE DAILY at the same time every day. °You may take it either with or without food. ° °If you have difficulty swallowing the tablet whole, you may crush it and mix in applesauce just prior to taking your dose. ° °Take Xarelto® exactly as prescribed by your doctor and DO NOT stop taking Xarelto® without talking to the doctor who prescribed the medication.  Stopping without other VTE prevention medication to take the place of Xarelto® may increase your risk of developing a clot. ° °After discharge, you should have regular check-up appointments with your healthcare provider that is prescribing your Xarelto®.   ° °What do you do if you miss a dose? °If you miss a dose, take it as soon as you remember on the same day then continue your regularly scheduled once daily regimen the next day. Do not take two doses of Xarelto® on the same day.  ° °Important Safety Information °A possible side effect of Xarelto® is bleeding. You should call your healthcare provider right away if you experience any of the following: °? Bleeding from an injury or your nose  that does not stop. °? Unusual colored urine (red or dark brown) or unusual colored stools (red or black). °? Unusual bruising for unknown reasons. °? A serious fall or if you hit your head (even if there is no bleeding). ° °Some medicines may interact with Xarelto® and might increase your risk of bleeding while on Xarelto®. To help avoid this, consult your healthcare provider or pharmacist prior to using any new prescription or non-prescription medications, including herbals, vitamins, non-steroidal anti-inflammatory drugs (NSAIDs) and supplements. ° °This website has more information on Xarelto®: www.xarelto.com. ° ° °
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
## 2015-10-05 LAB — CBC
HEMATOCRIT: 41.5 % (ref 39.0–52.0)
HEMOGLOBIN: 13.8 g/dL (ref 13.0–17.0)
MCH: 29.2 pg (ref 26.0–34.0)
MCHC: 33.3 g/dL (ref 30.0–36.0)
MCV: 87.9 fL (ref 78.0–100.0)
Platelets: 196 10*3/uL (ref 150–400)
RBC: 4.72 MIL/uL (ref 4.22–5.81)
RDW: 13.3 % (ref 11.5–15.5)
WBC: 9.6 10*3/uL (ref 4.0–10.5)

## 2015-10-05 LAB — BASIC METABOLIC PANEL
ANION GAP: 8 (ref 5–15)
BUN: 12 mg/dL (ref 6–20)
CALCIUM: 9.3 mg/dL (ref 8.9–10.3)
CHLORIDE: 101 mmol/L (ref 101–111)
CO2: 31 mmol/L (ref 22–32)
Creatinine, Ser: 0.9 mg/dL (ref 0.61–1.24)
GFR calc non Af Amer: >60 mL/min (ref >60)
GLUCOSE: 135 mg/dL — AB (ref 65–99)
POTASSIUM: 3.9 mmol/L (ref 3.5–5.1)
Sodium: 140 mmol/L (ref 135–145)

## 2015-10-05 MED ORDER — METHOCARBAMOL 500 MG PO TABS: 500.0000 mg | ORAL_TABLET | Freq: Four times a day (QID) | ORAL | Status: DC | PRN

## 2015-10-05 MED ORDER — OXYCODONE HCL 10 MG PO TABS: 10.0000 mg | ORAL_TABLET | ORAL | Status: DC | PRN

## 2015-10-05 MED ORDER — RIVAROXABAN 10 MG PO TABS: 10.0000 mg | ORAL_TABLET | Freq: Every day | ORAL | Status: DC
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
## 2015-11-06 DIAGNOSIS — Z471 Aftercare following joint replacement surgery: Secondary | ICD-10-CM | POA: Diagnosis not present

## 2015-11-06 DIAGNOSIS — Z96651 Presence of right artificial knee joint: Secondary | ICD-10-CM | POA: Diagnosis not present

## 2015-11-09 DIAGNOSIS — M1711 Unilateral primary osteoarthritis, right knee: Secondary | ICD-10-CM | POA: Diagnosis not present

## 2015-11-12 DIAGNOSIS — M1711 Unilateral primary osteoarthritis, right knee: Secondary | ICD-10-CM | POA: Diagnosis not present

## 2015-11-14 DIAGNOSIS — M1711 Unilateral primary osteoarthritis, right knee: Secondary | ICD-10-CM | POA: Diagnosis not present

## 2015-11-15 DIAGNOSIS — F411 Generalized anxiety disorder: Secondary | ICD-10-CM | POA: Diagnosis not present

## 2015-11-19 DIAGNOSIS — M1711 Unilateral primary osteoarthritis, right knee: Secondary | ICD-10-CM | POA: Diagnosis not present

## 2015-11-21 DIAGNOSIS — M1711 Unilateral primary osteoarthritis, right knee: Secondary | ICD-10-CM | POA: Diagnosis not present

## 2015-11-26 DIAGNOSIS — M1711 Unilateral primary osteoarthritis, right knee: Secondary | ICD-10-CM | POA: Diagnosis not present

## 2015-11-28 DIAGNOSIS — M1711 Unilateral primary osteoarthritis, right knee: Secondary | ICD-10-CM | POA: Diagnosis not present

## 2015-11-30 DIAGNOSIS — M1711 Unilateral primary osteoarthritis, right knee: Secondary | ICD-10-CM | POA: Diagnosis not present

## 2015-12-05 DIAGNOSIS — M1711 Unilateral primary osteoarthritis, right knee: Secondary | ICD-10-CM | POA: Diagnosis not present

## 2015-12-18 DIAGNOSIS — F411 Generalized anxiety disorder: Secondary | ICD-10-CM | POA: Diagnosis not present

## 2015-12-31 DIAGNOSIS — F411 Generalized anxiety disorder: Secondary | ICD-10-CM | POA: Diagnosis not present

## 2016-01-15 ENCOUNTER — Emergency Department (HOSPITAL_COMMUNITY): Payer: BLUE CROSS/BLUE SHIELD

## 2016-01-15 ENCOUNTER — Emergency Department (HOSPITAL_COMMUNITY)
Admission: EM | Admit: 2016-01-15 | Discharge: 2016-01-15 | Disposition: A | Discharge status: Home / Self Care | Payer: BLUE CROSS/BLUE SHIELD | Attending: Emergency Medicine | Admitting: Emergency Medicine

## 2016-01-15 ENCOUNTER — Encounter (HOSPITAL_COMMUNITY): Payer: Self-pay | Admitting: *Deleted

## 2016-01-15 DIAGNOSIS — F1290 Cannabis use, unspecified, uncomplicated: Secondary | ICD-10-CM | POA: Insufficient documentation

## 2016-01-15 DIAGNOSIS — Y9389 Activity, other specified: Secondary | ICD-10-CM | POA: Insufficient documentation

## 2016-01-15 DIAGNOSIS — Y92410 Unspecified street and highway as the place of occurrence of the external cause: Secondary | ICD-10-CM | POA: Insufficient documentation

## 2016-01-15 DIAGNOSIS — R531 Weakness: Secondary | ICD-10-CM | POA: Diagnosis not present

## 2016-01-15 DIAGNOSIS — S8991XA Unspecified injury of right lower leg, initial encounter: Secondary | ICD-10-CM | POA: Diagnosis not present

## 2016-01-15 DIAGNOSIS — F1110 Opioid abuse, uncomplicated: Secondary | ICD-10-CM | POA: Insufficient documentation

## 2016-01-15 DIAGNOSIS — F1490 Cocaine use, unspecified, uncomplicated: Secondary | ICD-10-CM | POA: Insufficient documentation

## 2016-01-15 DIAGNOSIS — M542 Cervicalgia: Secondary | ICD-10-CM | POA: Diagnosis not present

## 2016-01-15 DIAGNOSIS — T148 Other injury of unspecified body region: Secondary | ICD-10-CM | POA: Diagnosis not present

## 2016-01-15 DIAGNOSIS — M25561 Pain in right knee: Secondary | ICD-10-CM | POA: Insufficient documentation

## 2016-01-15 DIAGNOSIS — F329 Major depressive disorder, single episode, unspecified: Secondary | ICD-10-CM | POA: Diagnosis not present

## 2016-01-15 DIAGNOSIS — Z79899 Other long term (current) drug therapy: Secondary | ICD-10-CM | POA: Insufficient documentation

## 2016-01-15 DIAGNOSIS — Y999 Unspecified external cause status: Secondary | ICD-10-CM | POA: Insufficient documentation

## 2016-01-15 DIAGNOSIS — Z96651 Presence of right artificial knee joint: Secondary | ICD-10-CM | POA: Insufficient documentation

## 2016-01-15 DIAGNOSIS — R51 Headache: Secondary | ICD-10-CM | POA: Insufficient documentation

## 2016-01-15 DIAGNOSIS — R519 Headache, unspecified: Secondary | ICD-10-CM

## 2016-01-15 MED ORDER — IBUPROFEN 200 MG PO TABS
400.0000 mg | ORAL_TABLET | Freq: Once | ORAL | Status: AC | PRN
  Administered 2016-01-15: 400 mg via ORAL
  Filled 2016-01-15: qty 2

## 2016-01-15 MED ORDER — KETOROLAC TROMETHAMINE 60 MG/2ML IM SOLN
60.0000 mg | Freq: Once | INTRAMUSCULAR | Status: AC
  Administered 2016-01-15: 60 mg via INTRAMUSCULAR
  Filled 2016-01-15: qty 2

## 2016-01-15 MED ORDER — NAPROXEN 500 MG PO TABS: 500.0000 mg | ORAL_TABLET | Freq: Two times a day (BID) | ORAL | Status: DC

## 2016-01-15 MED ORDER — CYCLOBENZAPRINE HCL 10 MG PO TABS: 10.0000 mg | ORAL_TABLET | Freq: Two times a day (BID) | ORAL | Status: DC | PRN
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
## 2016-01-15 NOTE — Discharge Instructions (Signed)
Cervical Sprain °A cervical sprain is an injury in the neck in which the strong, fibrous tissues (ligaments) that connect your neck bones stretch or tear. Cervical sprains can range from mild to severe. Severe cervical sprains can cause the neck vertebrae to be unstable. This can lead to damage of the spinal cord and can result in serious nervous system problems. The amount of time it takes for a cervical sprain to get better depends on the cause and extent of the injury. Most cervical sprains heal in 1 to 3 weeks. °CAUSES  °Severe cervical sprains may be caused by:  °· Contact sport injuries (such as from football, rugby, wrestling, hockey, auto racing, gymnastics, diving, martial arts, or boxing).   °· Motor vehicle collisions.   °· Whiplash injuries. This is an injury from a sudden forward and backward whipping movement of the head and neck.  °· Falls.   °Mild cervical sprains may be caused by:  °· Being in an awkward position, such as while cradling a telephone between your ear and shoulder.   °· Sitting in a chair that does not offer proper support.   °· Working at a poorly designed computer station.   °· Looking up or down for long periods of time.   °SYMPTOMS  °· Pain, soreness, stiffness, or a burning sensation in the front, back, or sides of the neck. This discomfort may develop immediately after the injury or slowly, 24 hours or more after the injury.   °· Pain or tenderness directly in the middle of the back of the neck.   °· Shoulder or upper back pain.   °· Limited ability to move the neck.   °· Headache.   °· Dizziness.   °· Weakness, numbness, or tingling in the hands or arms.   °· Muscle spasms.   °· Difficulty swallowing or chewing.   °· Tenderness and swelling of the neck.   °DIAGNOSIS  °Most of the time your health care provider can diagnose a cervical sprain by taking your history and doing a physical exam. Your health care provider will ask about previous neck injuries and any known neck  problems, such as arthritis in the neck. X-rays may be taken to find out if there are any other problems, such as with the bones of the neck. Other tests, such as a CT scan or MRI, may also be needed.  °TREATMENT  °Treatment depends on the severity of the cervical sprain. Mild sprains can be treated with rest, keeping the neck in place (immobilization), and pain medicines. Severe cervical sprains are immediately immobilized. Further treatment is done to help with pain, muscle spasms, and other symptoms and may include: °· Medicines, such as pain relievers, numbing medicines, or muscle relaxants.   °· Physical therapy. This may involve stretching exercises, strengthening exercises, and posture training. Exercises and improved posture can help stabilize the neck, strengthen muscles, and help stop symptoms from returning.   °HOME CARE INSTRUCTIONS  °· Put ice on the injured area.   °¨ Put ice in a plastic bag.   °¨ Place a towel between your skin and the bag.   °¨ Leave the ice on for 15-20 minutes, 3-4 times a day.   °· If your injury was severe, you may have been given a cervical collar to wear. A cervical collar is a two-piece collar designed to keep your neck from moving while it heals. °¨ Do not remove the collar unless instructed by your health care provider. °¨ If you have long hair, keep it outside of the collar. °¨ Ask your health care provider before making any adjustments to your collar. Minor   adjustments may be required over time to improve comfort and reduce pressure on your chin or on the back of your head.  Ifyou are allowed to remove the collar for cleaning or bathing, follow your health care provider's instructions on how to do so safely.  Keep your collar clean by wiping it with mild soap and water and drying it completely. If the collar you have been given includes removable pads, remove them every 1-2 days and hand wash them with soap and water. Allow them to air dry. They should be completely  dry before you wear them in the collar.  If you are allowed to remove the collar for cleaning and bathing, wash and dry the skin of your neck. Check your skin for irritation or sores. If you see any, tell your health care provider.  Do not drive while wearing the collar.   Only take over-the-counter or prescription medicines for pain, discomfort, or fever as directed by your health care provider.   Keep all follow-up appointments as directed by your health care provider.   Keep all physical therapy appointments as directed by your health care provider.   Make any needed adjustments to your workstation to promote good posture.   Avoid positions and activities that make your symptoms worse.   Warm up and stretch before being active to help prevent problems.  SEEK MEDICAL CARE IF:   Your pain is not controlled with medicine.   You are unable to decrease your pain medicine over time as planned.   Your activity level is not improving as expected.  SEEK IMMEDIATE MEDICAL CARE IF:   You develop any bleeding.  You develop stomach upset.  You have signs of an allergic reaction to your medicine.   Your symptoms get worse.   You develop new, unexplained symptoms.   You have numbness, tingling, weakness, or paralysis in any part of your body.  MAKE SURE YOU:   Understand these instructions.  Will watch your condition.  Will get help right away if you are not doing well or get worse.   This information is not intended to replace advice given to you by your health care provider. Make sure you discuss any questions you have with your health care provider.   Document Released: 04/20/2007 Document Revised: 06/28/2013 Document Reviewed: 12/29/2012 Elsevier Interactive Patient Education 2016 [place] Injury, Adult You have received a head injury. It does not appear serious at this time. Headaches and vomiting are common following head injury. It should be easy  to awaken from sleeping. Sometimes it is necessary for you to stay in the emergency department for a while for observation. Sometimes admission to the hospital may be needed. After injuries such as yours, most problems occur within the first 24 hours, but side effects may occur up to 7-10 days after the injury. It is important for you to carefully monitor your condition and contact your health care provider or seek immediate medical care if there is a change in your condition. WHAT ARE THE TYPES OF HEAD INJURIES? Head injuries can be as minor as a bump. Some head injuries can be more severe. More severe head injuries include:  A jarring injury to the brain (concussion).  A bruise of the brain (contusion). This mean there is bleeding in the brain that can cause swelling.  A cracked skull (skull fracture).  Bleeding in the brain that collects, clots, and forms a bump (hematoma). WHAT CAUSES A HEAD INJURY? A serious head  injury is most likely to happen to someone who is in a car wreck and is not wearing a seat belt. Other causes of major head injuries include bicycle or motorcycle accidents, sports injuries, and falls. HOW ARE HEAD INJURIES DIAGNOSED? A complete history of the event leading to the injury and your current symptoms will be helpful in diagnosing head injuries. Many times, pictures of the brain, such as CT or MRI are needed to see the extent of the injury. Often, an overnight hospital stay is necessary for observation.  WHEN SHOULD I SEEK IMMEDIATE MEDICAL CARE?  You should get help right away if:  You have confusion or drowsiness.  You feel sick to your stomach (nauseous) or have continued, forceful vomiting.  You have dizziness or unsteadiness that is getting worse.  You have severe, continued headaches not relieved by medicine. Only take over-the-counter or prescription medicines for pain, fever, or discomfort as directed by your health care provider.  You do not have normal  function of the arms or legs or are unable to walk.  You notice changes in the black spots in the center of the colored part of your eye (pupil).  You have a clear or bloody fluid coming from your nose or ears.  You have a loss of vision. During the next 24 hours after the injury, you must stay with someone who can watch you for the warning signs. This person should contact local emergency services (911 in the [place] if you have seizures, you become unconscious, or you are unable to wake up. HOW CAN I PREVENT A HEAD INJURY IN THE FUTURE? The most important factor for preventing major head injuries is avoiding motor vehicle accidents. To minimize the potential for damage to your head, it is crucial to wear seat belts while riding in motor vehicles. Wearing helmets while bike riding and playing collision sports (like football) is also helpful. Also, avoiding dangerous activities around the house will further help reduce your risk of head injury.  WHEN CAN I RETURN TO NORMAL ACTIVITIES AND ATHLETICS? You should be reevaluated by your health care provider before returning to these activities. If you have any of the following symptoms, you should not return to activities or contact sports until 1 week after the symptoms have stopped:  Persistent headache.  Dizziness or vertigo.  Poor attention and concentration.  Confusion.  Memory problems.  Nausea or vomiting.  Fatigue or tire easily.  Irritability.  Intolerant of bright lights or loud noises.  Anxiety or depression.  Disturbed sleep. MAKE SURE YOU:   Understand these instructions.  Will watch your condition.  Will get help right away if you are not doing well or get worse.   This information is not intended to replace advice given to you by your health care provider. Make sure you discuss any questions you have with your health care provider.   Document Released: 06/23/2005 Document Revised: 07/14/2014 Document Reviewed:  02/28/2013 Elsevier Interactive Patient Education 2016 [employer]. Technical brewer It is common to have multiple bruises and sore muscles after a motor vehicle collision (MVC). These tend to feel worse for the first 24 hours. You may have the most stiffness and soreness over the first several hours. You may also feel worse when you wake up the first morning after your collision. After this point, you will usually begin to improve with each day. The speed of improvement often depends on the severity of the collision, the number of injuries, and the  location and nature of these injuries. HOME CARE INSTRUCTIONS  Put ice on the injured area.  Put ice in a plastic bag.  Place a towel between your skin and the bag.  Leave the ice on for 15-20 minutes, 3-4 times a day, or as directed by your health care provider.  Drink enough fluids to keep your urine clear or pale yellow. Do not drink alcohol.  Take a warm shower or bath once or twice a day. This will increase blood flow to sore muscles.  You may return to activities as directed by your caregiver. Be careful when lifting, as this may aggravate neck or back pain.  Only take over-the-counter or prescription medicines for pain, discomfort, or fever as directed by your caregiver. Do not use aspirin. This may increase bruising and bleeding. SEEK IMMEDIATE MEDICAL CARE IF:  You have numbness, tingling, or weakness in the arms or legs.  You develop severe headaches not relieved with medicine.  You have severe neck pain, especially tenderness in the middle of the back of your neck.  You have changes in bowel or bladder control.  There is increasing pain in any area of the body.  You have shortness of breath, light-headedness, dizziness, or fainting.  You have chest pain.  You feel sick to your stomach (nauseous), throw up (vomit), or sweat.  You have increasing abdominal discomfort.  There is blood in your urine, stool, or  vomit.  You have pain in your shoulder (shoulder strap areas).  You feel your symptoms are getting worse. MAKE SURE YOU:  Understand these instructions.  Will watch your condition.  Will get help right away if you are not doing well or get worse.   This information is not intended to replace advice given to you by your health care provider. Make sure you discuss any questions you have with your health care provider.   Document Released: 06/23/2005 Document Revised: 07/14/2014 Document Reviewed: 11/20/2010 Elsevier Interactive Patient Education [employer].
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
## 2016-01-21 DIAGNOSIS — F411 Generalized anxiety disorder: Secondary | ICD-10-CM | POA: Diagnosis not present

## 2016-01-24 DIAGNOSIS — F411 Generalized anxiety disorder: Secondary | ICD-10-CM | POA: Diagnosis not present

## 2016-02-07 DIAGNOSIS — F411 Generalized anxiety disorder: Secondary | ICD-10-CM | POA: Diagnosis not present

## 2016-02-25 DIAGNOSIS — F411 Generalized anxiety disorder: Secondary | ICD-10-CM | POA: Diagnosis not present

## 2016-03-03 DIAGNOSIS — F411 Generalized anxiety disorder: Secondary | ICD-10-CM | POA: Diagnosis not present

## 2016-03-17 DIAGNOSIS — F411 Generalized anxiety disorder: Secondary | ICD-10-CM | POA: Diagnosis not present

## 2016-04-07 DIAGNOSIS — F411 Generalized anxiety disorder: Secondary | ICD-10-CM | POA: Diagnosis not present

## 2016-04-22 DIAGNOSIS — F411 Generalized anxiety disorder: Secondary | ICD-10-CM | POA: Diagnosis not present

## 2016-04-28 DIAGNOSIS — F411 Generalized anxiety disorder: Secondary | ICD-10-CM | POA: Diagnosis not present

## 2016-05-14 DIAGNOSIS — F411 Generalized anxiety disorder: Secondary | ICD-10-CM | POA: Diagnosis not present

## 2016-05-20 DIAGNOSIS — F411 Generalized anxiety disorder: Secondary | ICD-10-CM | POA: Diagnosis not present

## 2016-05-26 DIAGNOSIS — F411 Generalized anxiety disorder: Secondary | ICD-10-CM | POA: Diagnosis not present

## 2016-06-03 DIAGNOSIS — F411 Generalized anxiety disorder: Secondary | ICD-10-CM | POA: Diagnosis not present

## 2016-06-17 DIAGNOSIS — F411 Generalized anxiety disorder: Secondary | ICD-10-CM | POA: Diagnosis not present

## 2016-06-25 DIAGNOSIS — B029 Zoster without complications: Secondary | ICD-10-CM | POA: Diagnosis not present

## 2016-06-26 ENCOUNTER — Encounter (HOSPITAL_COMMUNITY): Payer: Self-pay

## 2016-06-26 ENCOUNTER — Emergency Department (HOSPITAL_COMMUNITY): Payer: BLUE CROSS/BLUE SHIELD

## 2016-06-26 ENCOUNTER — Emergency Department (HOSPITAL_COMMUNITY)
Admission: EM | Admit: 2016-06-26 | Discharge: 2016-06-26 | Disposition: A | Discharge status: Home / Self Care | Payer: BLUE CROSS/BLUE SHIELD | Attending: Emergency Medicine | Admitting: Emergency Medicine

## 2016-06-26 DIAGNOSIS — R109 Unspecified abdominal pain: Secondary | ICD-10-CM | POA: Diagnosis not present

## 2016-06-26 DIAGNOSIS — Z7901 Long term (current) use of anticoagulants: Secondary | ICD-10-CM | POA: Insufficient documentation

## 2016-06-26 DIAGNOSIS — R202 Paresthesia of skin: Secondary | ICD-10-CM

## 2016-06-26 DIAGNOSIS — I639 Cerebral infarction, unspecified: Secondary | ICD-10-CM | POA: Insufficient documentation

## 2016-06-26 DIAGNOSIS — R112 Nausea with vomiting, unspecified: Secondary | ICD-10-CM

## 2016-06-26 DIAGNOSIS — Z96651 Presence of right artificial knee joint: Secondary | ICD-10-CM | POA: Insufficient documentation

## 2016-06-26 DIAGNOSIS — R200 Anesthesia of skin: Secondary | ICD-10-CM

## 2016-06-26 DIAGNOSIS — K5790 Diverticulosis of intestine, part unspecified, without perforation or abscess without bleeding: Secondary | ICD-10-CM | POA: Diagnosis not present

## 2016-06-26 DIAGNOSIS — I6789 Other cerebrovascular disease: Secondary | ICD-10-CM | POA: Diagnosis not present

## 2016-06-26 DIAGNOSIS — B029 Zoster without complications: Secondary | ICD-10-CM | POA: Diagnosis not present

## 2016-06-26 DIAGNOSIS — R531 Weakness: Secondary | ICD-10-CM | POA: Diagnosis not present

## 2016-06-26 DIAGNOSIS — R29810 Facial weakness: Secondary | ICD-10-CM | POA: Diagnosis not present

## 2016-06-26 DIAGNOSIS — R29818 Other symptoms and signs involving the nervous system: Secondary | ICD-10-CM | POA: Diagnosis not present

## 2016-06-26 LAB — COMPREHENSIVE METABOLIC PANEL
ALT: 17 U/L (ref 17–63)
ANION GAP: 9 (ref 5–15)
AST: 20 U/L (ref 15–41)
Albumin: 4.4 g/dL (ref 3.5–5.0)
Alkaline Phosphatase: 59 U/L (ref 38–126)
BILIRUBIN TOTAL: 0.7 mg/dL (ref 0.3–1.2)
BUN: <5 mg/dL — ABNORMAL LOW (ref 6–20)
CO2: 26 mmol/L (ref 22–32)
Calcium: 9.4 mg/dL (ref 8.9–10.3)
Chloride: 95 mmol/L — ABNORMAL LOW (ref 101–111)
Creatinine, Ser: 1.01 mg/dL (ref 0.61–1.24)
GFR calc Af Amer: >60 mL/min (ref >60)
Glucose, Bld: 123 mg/dL — ABNORMAL HIGH (ref 65–99)
POTASSIUM: 3.8 mmol/L (ref 3.5–5.1)
Sodium: 130 mmol/L — ABNORMAL LOW (ref 135–145)
TOTAL PROTEIN: 6.8 g/dL (ref 6.5–8.1)

## 2016-06-26 LAB — CBC
HEMATOCRIT: 47.2 % (ref 39.0–52.0)
HEMOGLOBIN: 16.9 g/dL (ref 13.0–17.0)
MCH: 29.5 pg (ref 26.0–34.0)
MCHC: 35.8 g/dL (ref 30.0–36.0)
MCV: 82.4 fL (ref 78.0–100.0)
Platelets: 205 10*3/uL (ref 150–400)
RBC: 5.73 MIL/uL (ref 4.22–5.81)
RDW: 12.6 % (ref 11.5–15.5)
WBC: 5.8 10*3/uL (ref 4.0–10.5)

## 2016-06-26 LAB — I-STAT CHEM 8, ED
BUN: 6 mg/dL (ref 6–20)
CALCIUM ION: 1.14 mmol/L — AB (ref 1.15–1.40)
CREATININE: 0.9 mg/dL (ref 0.61–1.24)
Chloride: 91 mmol/L — ABNORMAL LOW (ref 101–111)
Glucose, Bld: 130 mg/dL — ABNORMAL HIGH (ref 65–99)
HEMATOCRIT: 51 % (ref 39.0–52.0)
HEMOGLOBIN: 17.3 g/dL — AB (ref 13.0–17.0)
Potassium: 3.7 mmol/L (ref 3.5–5.1)
SODIUM: 130 mmol/L — AB (ref 135–145)
TCO2: 24 mmol/L (ref 0–100)

## 2016-06-26 LAB — DIFFERENTIAL
Basophils Absolute: 0 10*3/uL (ref 0.0–0.1)
Basophils Relative: 0 %
EOS ABS: 0 10*3/uL (ref 0.0–0.7)
EOS PCT: 0 %
LYMPHS ABS: 0.6 10*3/uL — AB (ref 0.7–4.0)
Lymphocytes Relative: 11 %
MONO ABS: 0.6 10*3/uL (ref 0.1–1.0)
MONOS PCT: 9 %
Neutro Abs: 4.6 10*3/uL (ref 1.7–7.7)
Neutrophils Relative %: 80 %

## 2016-06-26 LAB — I-STAT TROPONIN, ED: TROPONIN I, POC: 0.01 ng/mL (ref 0.00–0.08)

## 2016-06-26 LAB — URINALYSIS, ROUTINE W REFLEX MICROSCOPIC
BILIRUBIN URINE: NEGATIVE
GLUCOSE, UA: NEGATIVE mg/dL
Hgb urine dipstick: NEGATIVE
KETONES UR: NEGATIVE mg/dL
LEUKOCYTES UA: NEGATIVE
NITRITE: NEGATIVE
PROTEIN: NEGATIVE mg/dL
Specific Gravity, Urine: 1.02 (ref 1.005–1.030)
pH: 8 (ref 5.0–8.0)

## 2016-06-26 LAB — LIPASE, BLOOD: Lipase: 17 U/L (ref 11–51)

## 2016-06-26 LAB — PROTIME-INR
INR: 0.96
Prothrombin Time: 12.8 seconds (ref 11.4–15.2)

## 2016-06-26 LAB — APTT: aPTT: 34 seconds (ref 24–36)

## 2016-06-26 MED ORDER — IOPAMIDOL (ISOVUE-370) INJECTION 76%
INTRAVENOUS | Status: AC
  Administered 2016-06-26: 100 mL
  Filled 2016-06-26: qty 100

## 2016-06-26 MED ORDER — IOPAMIDOL (ISOVUE-300) INJECTION 61%
INTRAVENOUS | Status: AC
  Filled 2016-06-26: qty 100

## 2016-06-26 MED ORDER — PROMETHAZINE HCL 25 MG/ML IJ SOLN
12.5000 mg | Freq: Once | INTRAMUSCULAR | Status: AC
  Administered 2016-06-26: 12.5 mg via INTRAVENOUS
  Filled 2016-06-26: qty 1

## 2016-06-26 MED ORDER — METOCLOPRAMIDE HCL 5 MG/ML IJ SOLN
10.0000 mg | Freq: Once | INTRAMUSCULAR | Status: AC
  Administered 2016-06-26: 10 mg via INTRAVENOUS
  Filled 2016-06-26: qty 2

## 2016-06-26 MED ORDER — MORPHINE SULFATE (PF) 4 MG/ML IV SOLN
4.0000 mg | Freq: Once | INTRAVENOUS | Status: AC
  Administered 2016-06-26: 4 mg via INTRAVENOUS
  Filled 2016-06-26: qty 1

## 2016-06-26 MED ORDER — ONDANSETRON 4 MG PO TBDP: 4.0000 mg | ORAL_TABLET | Freq: Three times a day (TID) | ORAL | 0 refills | Status: DC | PRN

## 2016-06-26 NOTE — ED Notes (Signed)
Patient Alert and oriented X4. Stable and ambulatory. Patient verbalized understanding of the discharge instructions.  Patient belongings were taken by the patient.  
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
## 2016-06-26 NOTE — ED Notes (Signed)
XR at bedside

## 2016-06-26 NOTE — ED Notes (Signed)
Patient walked independently and did not get dizzy or drop O2 saturations.  MD made aware and plan is to talk with patient about disposition.

## 2016-06-26 NOTE — ED Notes (Signed)
Neurology at bedside.

## 2016-07-15 ENCOUNTER — Emergency Department (HOSPITAL_COMMUNITY)
Admission: EM | Admit: 2016-07-15 | Discharge: 2016-07-15 | Discharge status: Absent without leave | Payer: BLUE CROSS/BLUE SHIELD

## 2016-07-17 DIAGNOSIS — Z96651 Presence of right artificial knee joint: Secondary | ICD-10-CM | POA: Diagnosis not present

## 2016-07-17 DIAGNOSIS — Z471 Aftercare following joint replacement surgery: Secondary | ICD-10-CM | POA: Diagnosis not present

## 2016-07-30 DIAGNOSIS — F411 Generalized anxiety disorder: Secondary | ICD-10-CM | POA: Diagnosis not present

## 2016-08-19 DIAGNOSIS — F411 Generalized anxiety disorder: Secondary | ICD-10-CM | POA: Diagnosis not present

## 2016-08-27 DIAGNOSIS — F411 Generalized anxiety disorder: Secondary | ICD-10-CM | POA: Diagnosis not present

## 2016-09-12 DIAGNOSIS — F411 Generalized anxiety disorder: Secondary | ICD-10-CM | POA: Diagnosis not present

## 2016-09-16 DIAGNOSIS — F411 Generalized anxiety disorder: Secondary | ICD-10-CM | POA: Diagnosis not present

## 2016-10-20 DIAGNOSIS — F411 Generalized anxiety disorder: Secondary | ICD-10-CM | POA: Diagnosis not present

## 2016-11-03 DIAGNOSIS — F411 Generalized anxiety disorder: Secondary | ICD-10-CM | POA: Diagnosis not present

## 2016-11-11 DIAGNOSIS — F411 Generalized anxiety disorder: Secondary | ICD-10-CM | POA: Diagnosis not present

## 2016-11-17 DIAGNOSIS — F411 Generalized anxiety disorder: Secondary | ICD-10-CM | POA: Diagnosis not present

## 2016-12-10 DIAGNOSIS — F411 Generalized anxiety disorder: Secondary | ICD-10-CM | POA: Diagnosis not present

## 2016-12-11 DIAGNOSIS — M25562 Pain in left knee: Secondary | ICD-10-CM | POA: Diagnosis not present

## 2016-12-11 DIAGNOSIS — G8929 Other chronic pain: Secondary | ICD-10-CM | POA: Diagnosis not present

## 2016-12-12 DIAGNOSIS — F411 Generalized anxiety disorder: Secondary | ICD-10-CM | POA: Diagnosis not present

## 2016-12-22 DIAGNOSIS — F411 Generalized anxiety disorder: Secondary | ICD-10-CM | POA: Diagnosis not present

## 2016-12-26 DIAGNOSIS — F411 Generalized anxiety disorder: Secondary | ICD-10-CM | POA: Diagnosis not present

## 2017-01-01 DIAGNOSIS — M238X2 Other internal derangements of left knee: Secondary | ICD-10-CM | POA: Diagnosis not present

## 2017-01-01 DIAGNOSIS — G8929 Other chronic pain: Secondary | ICD-10-CM | POA: Diagnosis not present

## 2017-01-01 DIAGNOSIS — M25562 Pain in left knee: Secondary | ICD-10-CM | POA: Diagnosis not present

## 2017-01-05 DIAGNOSIS — F411 Generalized anxiety disorder: Secondary | ICD-10-CM | POA: Diagnosis not present

## 2017-01-06 DIAGNOSIS — F411 Generalized anxiety disorder: Secondary | ICD-10-CM | POA: Diagnosis not present

## 2017-01-09 DIAGNOSIS — M238X2 Other internal derangements of left knee: Secondary | ICD-10-CM | POA: Diagnosis not present

## 2017-01-15 DIAGNOSIS — M1712 Unilateral primary osteoarthritis, left knee: Secondary | ICD-10-CM | POA: Diagnosis not present

## 2017-01-15 DIAGNOSIS — M23322 Other meniscus derangements, posterior horn of medial meniscus, left knee: Secondary | ICD-10-CM | POA: Diagnosis not present

## 2017-01-19 DIAGNOSIS — F411 Generalized anxiety disorder: Secondary | ICD-10-CM | POA: Diagnosis not present

## 2017-02-02 DIAGNOSIS — F411 Generalized anxiety disorder: Secondary | ICD-10-CM | POA: Diagnosis not present

## 2017-02-04 DIAGNOSIS — M1712 Unilateral primary osteoarthritis, left knee: Secondary | ICD-10-CM | POA: Diagnosis not present

## 2017-02-11 DIAGNOSIS — M1712 Unilateral primary osteoarthritis, left knee: Secondary | ICD-10-CM | POA: Diagnosis not present

## 2017-02-19 DIAGNOSIS — M1712 Unilateral primary osteoarthritis, left knee: Secondary | ICD-10-CM | POA: Diagnosis not present

## 2017-03-10 DIAGNOSIS — F411 Generalized anxiety disorder: Secondary | ICD-10-CM | POA: Diagnosis not present

## 2017-03-24 DIAGNOSIS — F411 Generalized anxiety disorder: Secondary | ICD-10-CM | POA: Diagnosis not present

## 2017-04-07 DIAGNOSIS — F411 Generalized anxiety disorder: Secondary | ICD-10-CM | POA: Diagnosis not present

## 2017-05-05 DIAGNOSIS — F411 Generalized anxiety disorder: Secondary | ICD-10-CM | POA: Diagnosis not present

## 2017-05-19 DIAGNOSIS — F411 Generalized anxiety disorder: Secondary | ICD-10-CM | POA: Diagnosis not present

## 2017-05-26 DIAGNOSIS — F411 Generalized anxiety disorder: Secondary | ICD-10-CM | POA: Diagnosis not present

## 2017-06-03 DIAGNOSIS — J209 Acute bronchitis, unspecified: Secondary | ICD-10-CM | POA: Diagnosis not present

## 2017-06-10 DIAGNOSIS — F411 Generalized anxiety disorder: Secondary | ICD-10-CM | POA: Diagnosis not present

## 2017-06-24 DIAGNOSIS — F411 Generalized anxiety disorder: Secondary | ICD-10-CM | POA: Diagnosis not present

## 2017-07-08 DIAGNOSIS — F411 Generalized anxiety disorder: Secondary | ICD-10-CM | POA: Diagnosis not present

## 2017-07-15 DIAGNOSIS — F411 Generalized anxiety disorder: Secondary | ICD-10-CM | POA: Diagnosis not present

## 2017-07-17 DIAGNOSIS — Z23 Encounter for immunization: Secondary | ICD-10-CM | POA: Diagnosis not present

## 2017-07-21 ENCOUNTER — Emergency Department (HOSPITAL_COMMUNITY)
Admission: EM | Admit: 2017-07-21 | Discharge: 2017-07-21 | Disposition: A | Discharge status: Home / Self Care | Payer: BLUE CROSS/BLUE SHIELD | Attending: Emergency Medicine | Admitting: Emergency Medicine

## 2017-07-21 ENCOUNTER — Encounter (HOSPITAL_COMMUNITY): Payer: Self-pay | Admitting: Emergency Medicine

## 2017-07-21 DIAGNOSIS — Y9389 Activity, other specified: Secondary | ICD-10-CM | POA: Diagnosis not present

## 2017-07-21 DIAGNOSIS — Z79899 Other long term (current) drug therapy: Secondary | ICD-10-CM | POA: Insufficient documentation

## 2017-07-21 DIAGNOSIS — S60414A Abrasion of right ring finger, initial encounter: Secondary | ICD-10-CM | POA: Diagnosis not present

## 2017-07-21 DIAGNOSIS — F419 Anxiety disorder, unspecified: Secondary | ICD-10-CM | POA: Insufficient documentation

## 2017-07-21 DIAGNOSIS — S30813A Abrasion of scrotum and testes, initial encounter: Secondary | ICD-10-CM | POA: Insufficient documentation

## 2017-07-21 DIAGNOSIS — Z23 Encounter for immunization: Secondary | ICD-10-CM | POA: Insufficient documentation

## 2017-07-21 DIAGNOSIS — Y998 Other external cause status: Secondary | ICD-10-CM | POA: Diagnosis not present

## 2017-07-21 DIAGNOSIS — W540XXA Bitten by dog, initial encounter: Secondary | ICD-10-CM | POA: Insufficient documentation

## 2017-07-21 DIAGNOSIS — S60474A Other superficial bite of right ring finger, initial encounter: Secondary | ICD-10-CM | POA: Diagnosis not present

## 2017-07-21 DIAGNOSIS — S61254A Open bite of right ring finger without damage to nail, initial encounter: Secondary | ICD-10-CM | POA: Diagnosis not present

## 2017-07-21 DIAGNOSIS — Y929 Unspecified place or not applicable: Secondary | ICD-10-CM | POA: Diagnosis not present

## 2017-07-21 DIAGNOSIS — Z96651 Presence of right artificial knee joint: Secondary | ICD-10-CM | POA: Insufficient documentation

## 2017-07-21 DIAGNOSIS — F329 Major depressive disorder, single episode, unspecified: Secondary | ICD-10-CM | POA: Insufficient documentation

## 2017-07-21 MED ORDER — CLINDAMYCIN HCL 300 MG PO CAPS: 300.0000 mg | ORAL_CAPSULE | Freq: Three times a day (TID) | ORAL | 0 refills | Status: AC

## 2017-07-21 MED ORDER — MUPIROCIN CALCIUM 2 % EX CREA: 1.0000 "application " | TOPICAL_CREAM | Freq: Two times a day (BID) | CUTANEOUS | 0 refills | Status: DC

## 2017-07-21 MED ORDER — SULFAMETHOXAZOLE-TRIMETHOPRIM 800-160 MG PO TABS: 1.0000 | ORAL_TABLET | Freq: Two times a day (BID) | ORAL | 0 refills | Status: AC

## 2017-07-21 MED ORDER — TETANUS-DIPHTH-ACELL PERTUSSIS 5-2.5-18.5 LF-MCG/0.5 IM SUSP
0.5000 mL | Freq: Once | INTRAMUSCULAR | Status: AC
  Administered 2017-07-21: 0.5 mL via INTRAMUSCULAR
  Filled 2017-07-21: qty 0.5

## 2017-07-21 NOTE — Discharge Instructions (Signed)
It is very important that you take the full course of antibiotics as prescribed.  Keep the wound dressed with antibiotic ointment.  We have also contacted animal control for follow-up of the animal.  If the animal turns out to have possible rabies, you will be contacted and vaccinated.  Otherwise, keep a very close eye on your finger and return with any worsening redness, pain, or drainage.  The same applies to the wound on your scrotum.  I do recommend a 48-hour recheck at your doctor to make sure that everything is healing appropriately without infection.

## 2017-07-21 NOTE — ED Provider Notes (Signed)
Patient placed in Quick Look pathway, seen and evaluated   Chief Complaint: dog bite  HPI:   Dog bite on R ring finger and scrotum by neighbor's dog approx 15hrs ago. Neighbor said dog is vaccinated. Unknown if teeth or claws caused trauma to scrotum. Used peroxide to clean last night. Unknown patient's last tetanus. No blood thinner use.  ROS: laceration  Physical Exam:   Gen: No distress  Neuro: Awake and Alert  Skin: Warm    Focused Exam: Superficial skin abrasion noted to R ring finger with some nail involvement; bleeding controlled; limited ROM of finger 2/2 pain; superficial skin abrasions noted on anterior aspect of R scrotum with bleeding controlled. No testicular swelling or edema noted.   Initiation of care has begun. The patient has been counseled on the process, plan, and necessity for staying for the completion/evaluation, and the remainder of the medical screening examination   Delia Heady, PA-C 07/21/17 1246    Virgel Manifold, MD 07/21/17 [phone]

## 2017-07-21 NOTE — ED Notes (Signed)
Patient called for triage with no response.  

## 2017-07-21 NOTE — ED Triage Notes (Signed)
Patient reports being bitten on his right hand and scrotum last night at aprox 8pm by neighbors dog. Unsure if vaccinated, owner states it is. Bleeding controlled to right ring finger. Abrasion to scrotum.
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
## 2017-07-21 NOTE — ED Notes (Signed)
Bed: [facility] Expected date:  Expected time:  Means of arrival:  Comments: 

## 2017-07-22 DIAGNOSIS — F411 Generalized anxiety disorder: Secondary | ICD-10-CM | POA: Diagnosis not present

## 2017-08-05 DIAGNOSIS — F411 Generalized anxiety disorder: Secondary | ICD-10-CM | POA: Diagnosis not present

## 2017-08-07 DIAGNOSIS — J209 Acute bronchitis, unspecified: Secondary | ICD-10-CM | POA: Diagnosis not present

## 2017-08-12 DIAGNOSIS — F411 Generalized anxiety disorder: Secondary | ICD-10-CM | POA: Diagnosis not present

## 2017-08-31 DIAGNOSIS — J209 Acute bronchitis, unspecified: Secondary | ICD-10-CM | POA: Diagnosis not present

## 2017-09-09 DIAGNOSIS — F411 Generalized anxiety disorder: Secondary | ICD-10-CM | POA: Diagnosis not present

## 2017-09-23 DIAGNOSIS — F411 Generalized anxiety disorder: Secondary | ICD-10-CM | POA: Diagnosis not present

## 2017-10-06 DIAGNOSIS — F411 Generalized anxiety disorder: Secondary | ICD-10-CM | POA: Diagnosis not present

## 2017-11-03 DIAGNOSIS — F411 Generalized anxiety disorder: Secondary | ICD-10-CM | POA: Diagnosis not present

## 2017-11-09 DIAGNOSIS — F411 Generalized anxiety disorder: Secondary | ICD-10-CM | POA: Diagnosis not present

## 2017-11-17 IMAGING — CT CT HEAD CODE STROKE
3 of 4 series · 17 of 47 positions shown, 20 images · non-contrast
Comparison: 09/13/2014

CLINICAL DATA: Code stroke. Nausea and vomiting. Generalized
weakness.

EXAM:
CT HEAD WITHOUT CONTRAST
TECHNIQUE: Contiguous axial images were obtained from the base of the skull
through the vertex without intravenous contrast.

[Series 201: head w/o, idose (1) · axial · non-contrast · 0.44mm/px · z∈[+58,+198]mm · 11 of 34 slices shown, 14 images]
[im 3/34  brain]
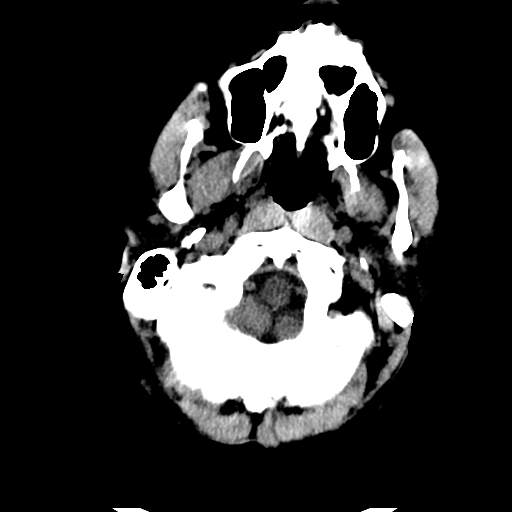
[im 3/34  bone]
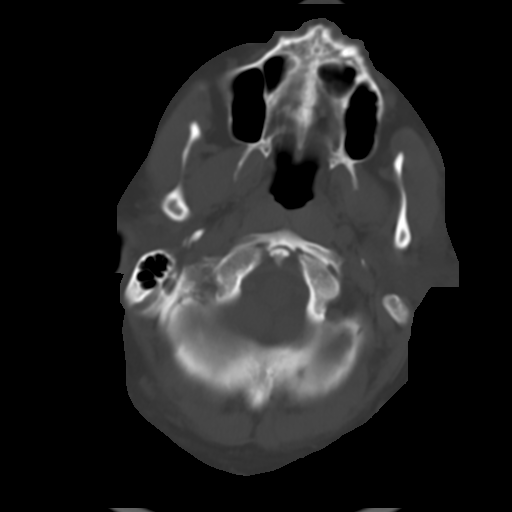
[im 5/34  brain]
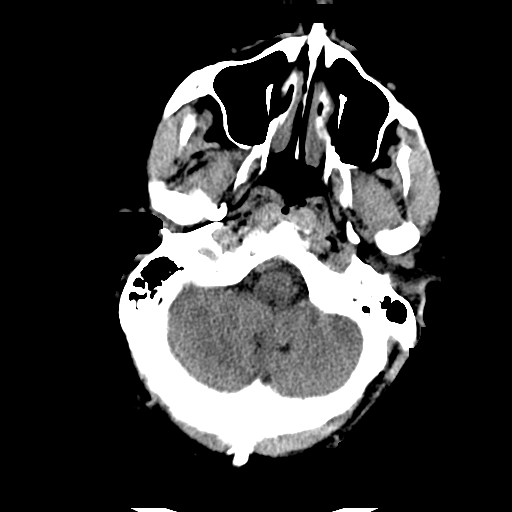
[im 8/34  brain]
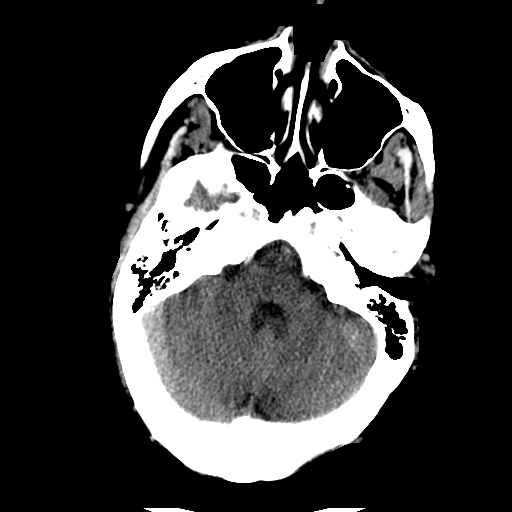
[im 12/34  brain]
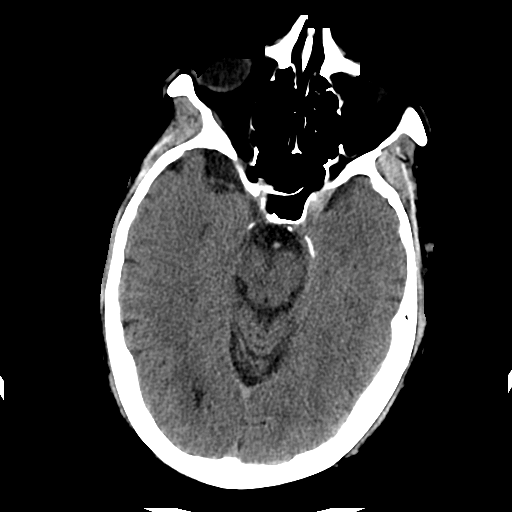
[im 15/34  brain]
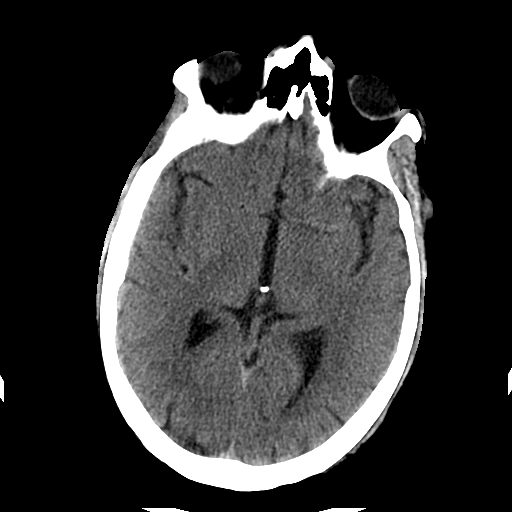
[im 15/34  bone]
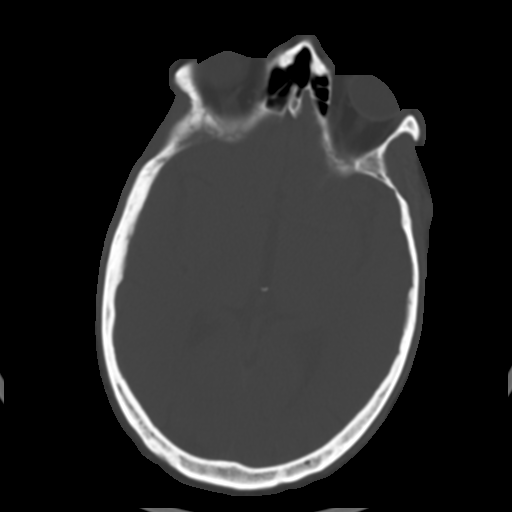
[im 17/34  brain]
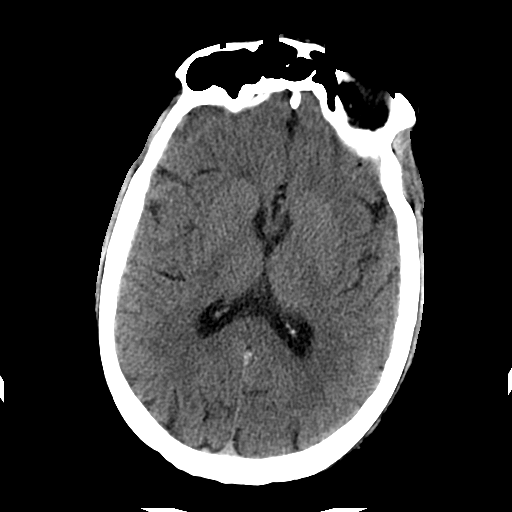
[im 19/34  brain]
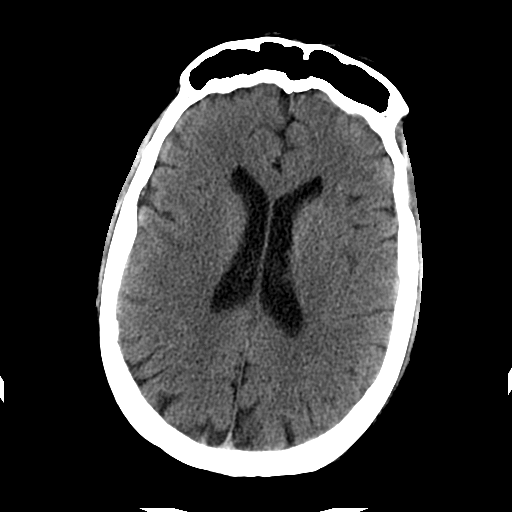
[im 22/34  brain]
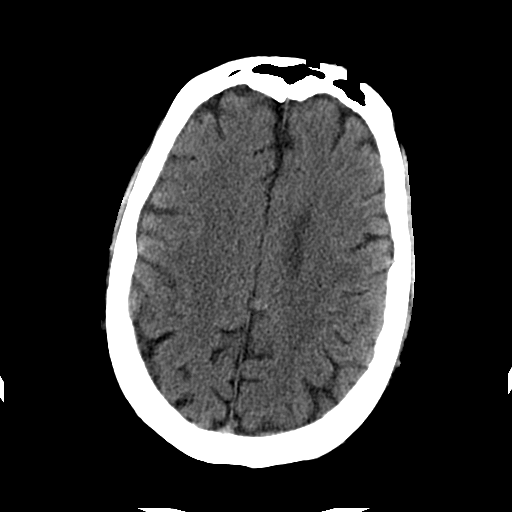
[im 26/34  brain]
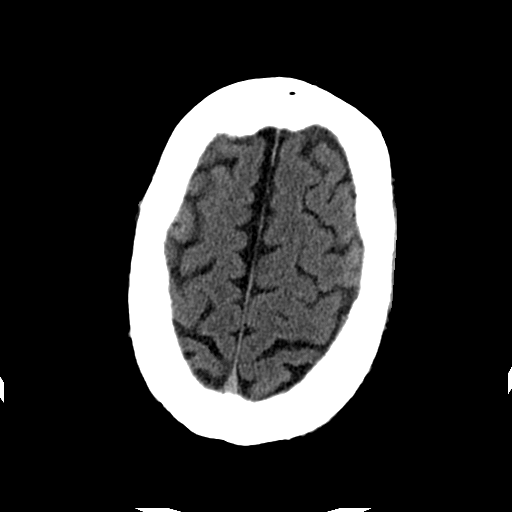
[im 26/34  bone]
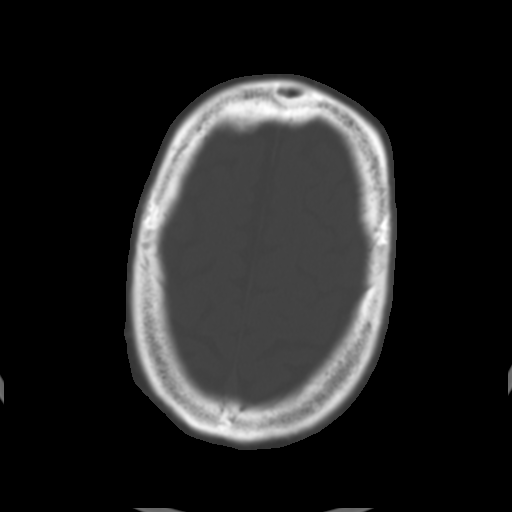
[im 29/34  brain]
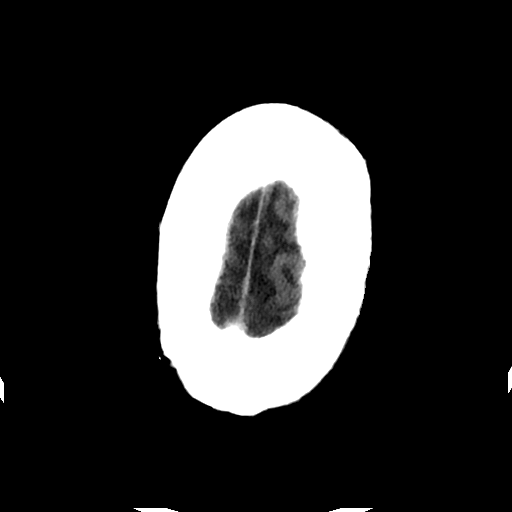
[im 31/34  brain]
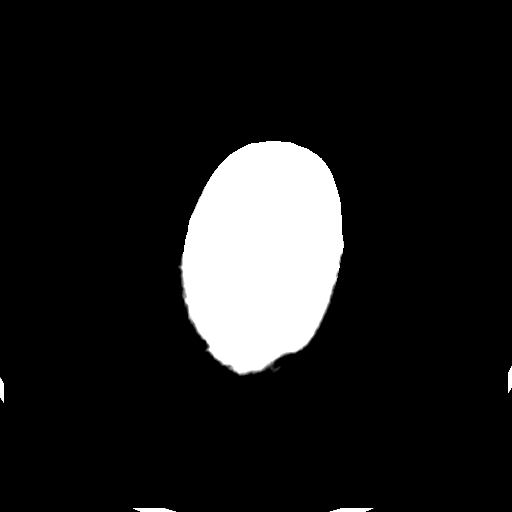

[Series 203: coronal st, idose (1) · coronal · 0.40mm/px · 3 of 73 slices shown]
[im 25/73  brain]
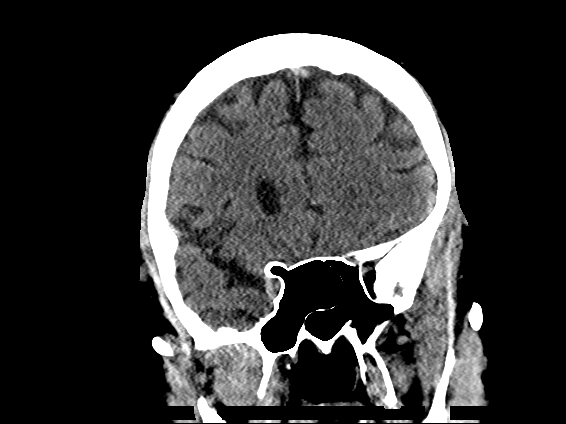
[im 33/73  brain]
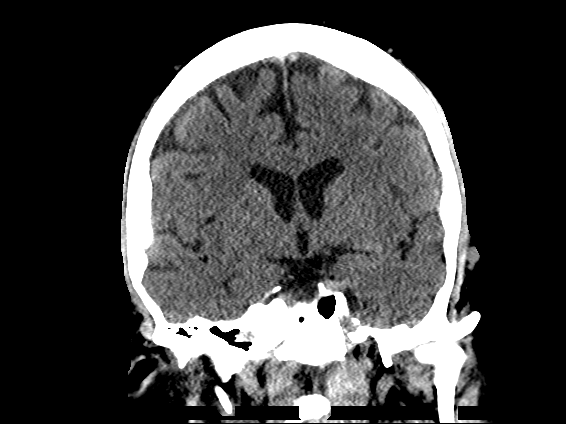
[im 41/73  brain]
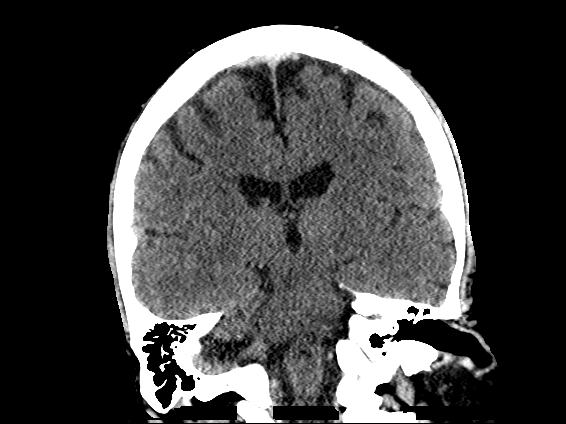

[Series 204: sagittal st, idose (1) · sagittal · 0.40mm/px · 3 of 73 slices shown]
[im 25/73  brain]
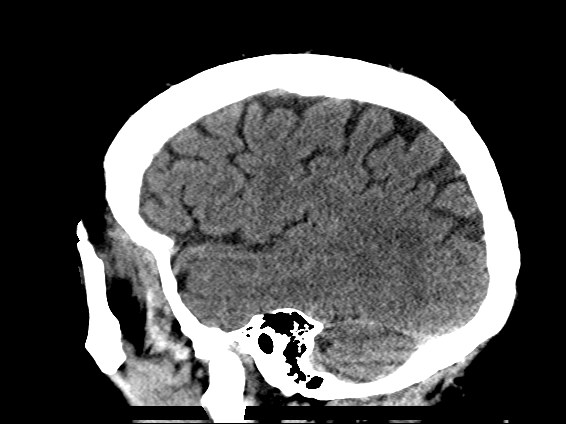
[im 37/73  brain]
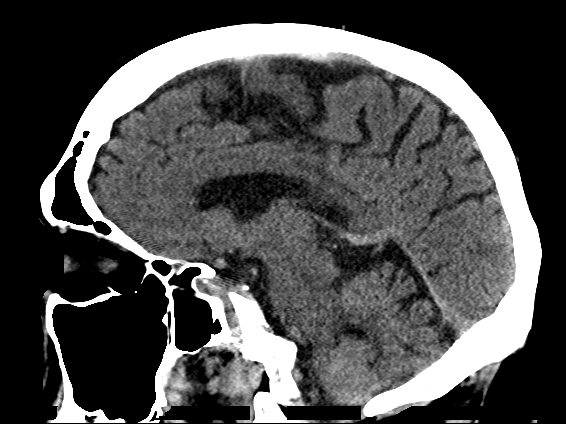
[im 49/73  brain]
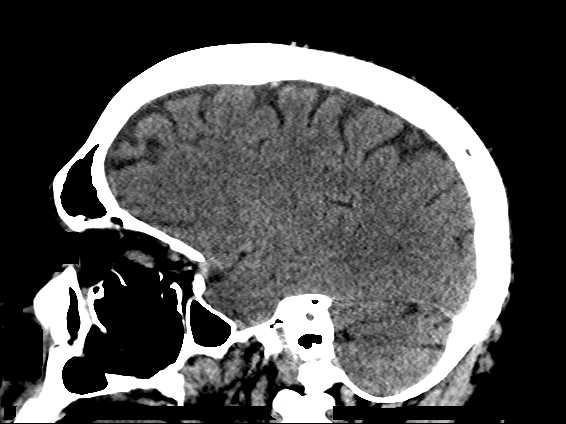

[17 of 47 positions shown; findings below may reference images not displayed]

FINDINGS: Brain: No evidence of acute infarction, hemorrhage, hydrocephalus,
extra-axial collection or mass lesion/mass effect.

Vascular: No hyperdense vessel when allowing for streak artifact.
Pending CTA.

Skull: Negative

Sinuses/Orbits: Negative

Other: These results were sent by text page at the time of
interpretation on 06/26/2016 at [DATE] to Dr. Bili. Verbal read back.

ASPECTS ([place] Stroke Program Early CT Score)

- Ganglionic level infarction (caudate, lentiform nuclei, internal
capsule, insula, M1-M3 cortex): 7

- Supraganglionic infarction (M4-M6 cortex): 3

Total score (0-10 with 10 being normal): 10
IMPRESSION: 1. No acute hemorrhage or visible infarct.ASPECTS is 10.
2. CTA is underway to further evaluate posterior circulation
vessels.

## 2017-11-18 DIAGNOSIS — F411 Generalized anxiety disorder: Secondary | ICD-10-CM | POA: Diagnosis not present

## 2017-12-02 DIAGNOSIS — F411 Generalized anxiety disorder: Secondary | ICD-10-CM | POA: Diagnosis not present

## 2017-12-15 DIAGNOSIS — F411 Generalized anxiety disorder: Secondary | ICD-10-CM | POA: Diagnosis not present

## 2017-12-31 DIAGNOSIS — F411 Generalized anxiety disorder: Secondary | ICD-10-CM | POA: Diagnosis not present

## 2018-01-21 DIAGNOSIS — F411 Generalized anxiety disorder: Secondary | ICD-10-CM | POA: Diagnosis not present

## 2018-02-23 DIAGNOSIS — F411 Generalized anxiety disorder: Secondary | ICD-10-CM | POA: Diagnosis not present

## 2018-03-04 DIAGNOSIS — F411 Generalized anxiety disorder: Secondary | ICD-10-CM | POA: Diagnosis not present

## 2018-03-21 DIAGNOSIS — F411 Generalized anxiety disorder: Secondary | ICD-10-CM | POA: Insufficient documentation

## 2018-03-21 DIAGNOSIS — F419 Anxiety disorder, unspecified: Secondary | ICD-10-CM | POA: Insufficient documentation

## 2018-03-24 DIAGNOSIS — F411 Generalized anxiety disorder: Secondary | ICD-10-CM | POA: Diagnosis not present

## 2018-04-06 ENCOUNTER — Ambulatory Visit (INDEPENDENT_AMBULATORY_CARE_PROVIDER_SITE_OTHER): Payer: BLUE CROSS/BLUE SHIELD | Admitting: Psychiatry

## 2018-04-06 DIAGNOSIS — F411 Generalized anxiety disorder: Secondary | ICD-10-CM | POA: Diagnosis not present
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
## 2018-04-09 ENCOUNTER — Other Ambulatory Visit: Payer: Self-pay | Admitting: Psychiatry

## 2018-04-16 ENCOUNTER — Encounter: Payer: Self-pay | Admitting: Emergency Medicine

## 2018-04-19 ENCOUNTER — Ambulatory Visit: Payer: BLUE CROSS/BLUE SHIELD | Admitting: Psychiatry

## 2018-04-19 ENCOUNTER — Telehealth: Payer: Self-pay | Admitting: Psychiatry

## 2018-04-19 NOTE — Telephone Encounter (Signed)
Thanks! I found it under another icon. This can work!

## 2018-04-19 NOTE — Telephone Encounter (Signed)
Johnny [name] called.  Knows it is late to cancel but he has some family obligations and just can't get here today.

## 2018-04-29 ENCOUNTER — Ambulatory Visit: Payer: BLUE CROSS/BLUE SHIELD | Admitting: Psychiatry

## 2018-04-29 ENCOUNTER — Encounter: Payer: Self-pay | Admitting: Psychiatry

## 2018-04-29 ENCOUNTER — Other Ambulatory Visit: Payer: Self-pay | Admitting: Psychiatry

## 2018-04-29 DIAGNOSIS — G3184 Mild cognitive impairment, so stated: Secondary | ICD-10-CM

## 2018-04-29 DIAGNOSIS — F411 Generalized anxiety disorder: Secondary | ICD-10-CM | POA: Diagnosis not present

## 2018-04-29 DIAGNOSIS — F3162 Bipolar disorder, current episode mixed, moderate: Secondary | ICD-10-CM
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
## 2018-04-29 NOTE — Patient Instructions (Addendum)
N-acetylcysteine 600 mg capsule 1 or 2 daily for concentration and memory [email or web address]

## 2018-05-03 ENCOUNTER — Ambulatory Visit (INDEPENDENT_AMBULATORY_CARE_PROVIDER_SITE_OTHER): Payer: BLUE CROSS/BLUE SHIELD | Admitting: Psychiatry

## 2018-05-03 DIAGNOSIS — F3162 Bipolar disorder, current episode mixed, moderate: Secondary | ICD-10-CM

## 2018-05-03 NOTE — Progress Notes (Signed)
      [facility] Counselor/Therapist Progress Note   Patient ID: SHAHEIM MAHAR, MRN: [id number]  Date: 05/03/2018  Timespent: 58 minutes   Treatment Type: Individual   Reported Symptoms: Verbal aggression and anxiety, irritability   Mental Status Exam:    Appearance:   Casual     Behavior:  Appropriate  Motor:  Normal  Speech/Language:   Clear and Coherent  Affect:  Appropriate  Mood:  angry, anxious, depressed, irritable and sad  Thought process:  normal  Thought content:    WNL  Sensory/Perceptual disturbances:    WNL  Orientation:  oriented to person, place, time/date and situation  Attention:  Good  Concentration:  Good  Memory:  WNL  Fund of knowledge:   Good  Insight:    Good  Judgment:   Good  Impulse Control:  Good     Risk Assessment: Danger to Self:  No Self-injurious Behavior: No Danger to Others: No Duty to Warn:no Physical Aggression / Violence:No  Access to Firearms a concern: No  Gang Involvement:No    Subjective: The client reports he had missed the last session because he was on vacation with his son at the beach.  The time to go had come up unexpectedly and he had forgotten his appointment. He states that his son has moved to his new living situation with a family through the placement agency.  Since he has been there he has run away 2 times.  Both times he has ended up at [facility].  The client is very concerned about his son's continued instability. The client's mother continues to decline.  Her cognitive functioning is poor.  He is not sure how much longer she should continue to live alone.  His ex-wife has graciously gone by and helped his mother with food and activities of daily living. Client is also very concerned about his income.  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  He has felt less of a weight since telling them but it has made things much more tight  financially. I used eye movement desensitization and reprocessing with the client to decrease his anger, anxiety and sadness from a subjective units of distress he equals 7 to less than 2.  The client used the hand paddles and visualized himself at a very calm place.  He encountered God and felt great relief. The client will continue to exercise, increase his self-care and sleep.   Interventions: Mindfulness Meditation, Solution-Oriented/Positive Psychology, [facility] Desensitization and Reprocessing (EMDR) and Supportive   Diagnosis:   ICD-10-CM   1. Moderate mixed bipolar I disorder (HCC) F31.62      Plan: Exercise, self care, prayer.   Albertina Parr Keiffer [place], [place]

## 2018-05-17 ENCOUNTER — Ambulatory Visit: Payer: BLUE CROSS/BLUE SHIELD | Admitting: Psychiatry

## 2018-05-17 DIAGNOSIS — F411 Generalized anxiety disorder: Secondary | ICD-10-CM | POA: Diagnosis not present

## 2018-05-17 NOTE — Progress Notes (Signed)
      [facility] Counselor/Therapist Progress Note   Patient ID: [name], MRN: [id number]  Date: 05/17/2018  Timespent: 56 minutes   Treatment Type: Individual   Reported Symptoms: Fatigue and stress   Mental Status Exam:    Appearance:   Casual     Behavior:  Appropriate  Motor:  Normal  Speech/Language:   Clear and Coherent  Affect:  Appropriate  Mood:  angry, anxious, irritable and sad  Thought process:  normal  Thought content:    WNL  Sensory/Perceptual disturbances:    WNL  Orientation:  oriented to person, place, time/date and situation  Attention:  Good  Concentration:  Good  Memory:  WNL  Fund of knowledge:   Good  Insight:    Good  Judgment:   Good  Impulse Control:  Good     Risk Assessment: Danger to Self:  No Self-injurious Behavior: No Danger to Others: No Duty to Warn:no Physical Aggression / Violence:No  Access to Firearms a concern: No  Gang Involvement:No    Subjective: The client reports that it has been a stressful 2 weeks.  His mother continues to decline but his ex-wife has been kind and helping her out on a regular basis.  He is very grateful that she is taking his mother to the beauty parlor.  She has not done this since July.  He was uncertain about her involvement with his mother but he has come to excepted.  He also states that his cousin and his mother's sister-in-law came for surprise visit from [place].  He states it was very good for his mother she is not eating very well and keeps running out of things that he has to get at the last minute such as toilet paper and depends. His son is still hospitalized at [facility] in [place].  He has lost his placement with the couple that he was to live with.  He states they are having difficulty finding a new placement for him. The client has been very frustrated with the placement agencies and their lack of concern for his care.  Client vented a lot about his  frustration not only with his son's care but about his life in general.  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 The client will continue to exercise, and use positive self talk.  The client finds great support and comfort in his church which he continues to attend.  I used eye movement desensitization and reprocessing today with the client to reduce his subjective units of's distress from a suds equals 7+ to less than 2 at the end of the session.   Interventions: Solution-Oriented/Positive Psychology, Eye Movement Desensitization and Reprocessing (EMDR) and Insight-Oriented   Diagnosis:   ICD-10-CM   1. Generalized anxiety disorder F41.1      Plan: exercise, positive self talk.   [name] Akashdeep [place], [place]

## 2018-05-31 ENCOUNTER — Ambulatory Visit (INDEPENDENT_AMBULATORY_CARE_PROVIDER_SITE_OTHER): Payer: BLUE CROSS/BLUE SHIELD | Admitting: Psychiatry

## 2018-05-31 ENCOUNTER — Encounter: Payer: Self-pay | Admitting: Psychiatry

## 2018-05-31 DIAGNOSIS — F411 Generalized anxiety disorder: Secondary | ICD-10-CM | POA: Diagnosis not present

## 2018-05-31 NOTE — Progress Notes (Signed)
      [facility] Counselor/Therapist Progress Note  Patient ID: [name], MRN: [id number],    Date: 05/31/2018  Time Spent: 57 minutes   Treatment Type: Individual Therapy  Reported Symptoms: Depressed mood, Anxious Mood and Irritability  Mental Status Exam:  Appearance:   Casual and Well Groomed     Behavior:  Appropriate  Motor:  Normal  Speech/Language:   Clear and Coherent  Affect:  Depressed  Mood:  angry, anxious, irritable and sad  Thought process:  normal  Thought content:    WNL  Sensory/Perceptual disturbances:    WNL  Orientation:  oriented to person, place, time/date and situation  Attention:  Good  Concentration:  Good  Memory:  WNL  Fund of knowledge:   Good  Insight:    Good  Judgment:   Good  Impulse Control:  Good   Risk Assessment: Danger to Self:  No Self-injurious Behavior: No Danger to Others: No Duty to Warn:no Physical Aggression / Violence:No  Access to Firearms a concern: No  Gang Involvement:No   Subjective: The client states that when he looked at his cell phone bill this month he realized that his ex-wife had a boyfriend.  This infuriated him.  He is unsure why he was so angry.  I used eye movement with the client to begin to process his anger.  His subjective units of distress was a 10.  As the client processed he realized he felt rejected by his ex-wife.  The fact that she took up with someone else hurt his feelings.  Although as we discussed he really does not know the nature of their relationship.  Currently his ex-wife continues to help his mother which has been very positive and helpful.  She also has plans to go and visit their son who is in a psychiatric hospital in [name]  The client was able to reduce his subjective units of distress to 2.  The client is frustrated financially because he just found out that his health insurance will almost double this next year.  This will be hard for him financially to afford.   He also is very sad that his son is in his 29th day of a psychiatric hospitalization.  He has talked to the physician who is treating his son.  They have agreed that his son needs to transition to the clients house before he goes into a permanent placement.  He and his ex-wife will meet with [name] services later next week. The client will continue his exercise and participate in his faith community.  Interventions: Solution-Oriented/Positive Psychology, Eye Movement Desensitization and Reprocessing (EMDR) and Insight-Oriented  Diagnosis:   ICD-10-CM   1. Generalized anxiety disorder F41.1     Plan: Exercise, positive self talk.  [name], [place]

## 2018-06-01 ENCOUNTER — Other Ambulatory Visit: Payer: Self-pay | Admitting: Psychiatry

## 2018-06-14 ENCOUNTER — Encounter: Payer: Self-pay | Admitting: Psychiatry

## 2018-06-14 ENCOUNTER — Ambulatory Visit: Payer: BLUE CROSS/BLUE SHIELD | Admitting: Psychiatry

## 2018-06-14 DIAGNOSIS — F3162 Bipolar disorder, current episode mixed, moderate: Secondary | ICD-10-CM

## 2018-06-14 NOTE — Progress Notes (Signed)
      [facility] Counselor/Therapist Progress Note  Patient ID: VIVIANO BIR, MRN: [id number],    Date: 06/14/2018  Time Spent: 58 minutes.  Treatment Type: Individual Therapy  Reported Symptoms: Depressed mood and Irritability  Mental Status Exam:  Appearance:   Casual     Behavior:  Appropriate  Motor:  Normal  Speech/Language:   Clear and Coherent  Affect:  Appropriate  Mood:  angry and sad  Thought process:  normal  Thought content:    WNL  Sensory/Perceptual disturbances:    WNL  Orientation:  oriented to person, place, time/date and situation  Attention:  Good  Concentration:  Good  Memory:  WNL  Fund of knowledge:   Good  Insight:    Good  Judgment:   Good  Impulse Control:  Good   Risk Assessment: Danger to Self:  No Self-injurious Behavior: No Danger to Others: No Duty to Warn:no Physical Aggression / Violence:No  Access to Firearms a concern: No  Gang Involvement:No   Subjective: The client reports that his son was recently discharged from the behavioral health [facility] in [place].  He will be staying with the client until after the first of the year.  He and his ex-wife recently met with the alternative family living person through [place] services.  The client had a lot of questions which he felt were satisfactorily answered.  He is hopeful that this will be a better placement.  The client states that he and his ex-wife drove to [place], [place].  It was a total of a 6-hour trip.  He did his best to be kind to her and helpful.  Today he brought in a list that was around 7 pages long of all the texts that his wife had sent between May 27, 2018 and June 09, 2018.  It was a total of 484 attacks.  A majority of them were to a man in [place] that apparently is connected to horse friends.  The client was angry and jealous.  Today we used eye movement desensitization and reprocessing to reduce his level of anger and jealousy  towards this man.  As he processed he realized that he was interpreting what was going on.  He understood that he could be completely incorrect and therefore had to let go of his anger and jealousy.  He agreed to turned over to the care of God to the best of his ability.  Interventions: Solution-Oriented/Positive Psychology, Eye Movement Desensitization and Reprocessing (EMDR) and Insight-Oriented  Diagnosis:   ICD-10-CM   1. Moderate mixed bipolar I disorder (HCC) F31.62     Plan: Acceptance, exercise, positive self talk, review thinking errors handout.  Albertina Parr Kadir [place], [place]

## 2018-07-05 ENCOUNTER — Ambulatory Visit: Payer: BLUE CROSS/BLUE SHIELD | Admitting: Psychiatry

## 2018-07-08 ENCOUNTER — Ambulatory Visit (INDEPENDENT_AMBULATORY_CARE_PROVIDER_SITE_OTHER): Payer: BLUE CROSS/BLUE SHIELD | Admitting: Psychiatry

## 2018-07-08 ENCOUNTER — Encounter: Payer: Self-pay | Admitting: Psychiatry

## 2018-07-08 VITALS — BP 149/86 | HR 86 | Wt 265.0 lb

## 2018-07-08 DIAGNOSIS — G3184 Mild cognitive impairment, so stated: Secondary | ICD-10-CM | POA: Diagnosis not present

## 2018-07-08 DIAGNOSIS — F5105 Insomnia due to other mental disorder: Secondary | ICD-10-CM

## 2018-07-08 DIAGNOSIS — F411 Generalized anxiety disorder: Secondary | ICD-10-CM

## 2018-07-08 DIAGNOSIS — F3162 Bipolar disorder, current episode mixed, moderate: Secondary | ICD-10-CM | POA: Diagnosis not present

## 2018-07-08 MED ORDER — QUETIAPINE FUMARATE 400 MG PO TABS: 800.0000 mg | ORAL_TABLET | Freq: Every day | ORAL | 5 refills | Status: DC

## 2018-07-08 MED ORDER — OXCARBAZEPINE 300 MG PO TABS: ORAL_TABLET | ORAL | 5 refills | Status: DC

## 2018-07-08 MED ORDER — CLONIDINE HCL 0.1 MG PO TABS: ORAL_TABLET | ORAL | 5 refills | Status: DC

## 2018-07-08 MED ORDER — LORAZEPAM 1 MG PO TABS: 1.0000 mg | ORAL_TABLET | Freq: Four times a day (QID) | ORAL | 3 refills | Status: DC | PRN

## 2018-07-08 MED ORDER — LAMOTRIGINE 100 MG PO TABS: ORAL_TABLET | ORAL | 2 refills | Status: DC
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
## 2018-07-20 ENCOUNTER — Ambulatory Visit (INDEPENDENT_AMBULATORY_CARE_PROVIDER_SITE_OTHER): Payer: BLUE CROSS/BLUE SHIELD | Admitting: Psychiatry

## 2018-07-20 ENCOUNTER — Encounter: Payer: Self-pay | Admitting: Psychiatry

## 2018-07-20 DIAGNOSIS — F3162 Bipolar disorder, current episode mixed, moderate: Secondary | ICD-10-CM

## 2018-07-20 DIAGNOSIS — F411 Generalized anxiety disorder: Secondary | ICD-10-CM | POA: Diagnosis not present

## 2018-07-20 NOTE — Progress Notes (Signed)
      [facility] Counselor/Therapist Progress Note  Patient ID: ZAYVON ALICEA, MRN: [id number],    Date: 07/20/2018  Time Spent: 50 minutes   Treatment Type: Individual Therapy  Reported Symptoms: Depressed mood, Anxious Mood, Irritability and Fatigue  Mental Status Exam:  Appearance:   Casual     Behavior:  Appropriate  Motor:  Normal  Speech/Language:   Clear and Coherent  Affect:  Appropriate  Mood:  anxious and irritable  Thought process:  normal  Thought content:    WNL  Sensory/Perceptual disturbances:    WNL  Orientation:  oriented to person, place, time/date and situation  Attention:  Good  Concentration:  Good  Memory:  WNL  Fund of knowledge:   Good  Insight:    Good  Judgment:   Good  Impulse Control:  Good   Risk Assessment: Danger to Self:  No Self-injurious Behavior: No Danger to Others: No Duty to Warn:no Physical Aggression / Violence:No  Access to Firearms a concern: No  Gang Involvement:No   Subjective: The client states that his 60 year old intellectually disabled son continues to live with him.  Each alternative family living situation that they investigate falls apart.  They have found no day program that will take him.  Since he has been living with the client his son has run away two times.  He ended up the [facility] long emergency department and the second time he ended up with [facility].  The client states, "I do not know how much longer I can do this."  His son is volatile and unpredictable and he is unsure as to what he can do.  I used eye movement desensitization and reprocessing with the client.  His subjective units of distress was a 7+ and it was below 3 at the end of the session.  As the client processed he was able to relax and see things more clearly he agrees that there is only so much he can do.  He needs to turn things over to the care of God.  He is very worried about his mother and her care.  She falls and hurts her  self and becomes disoriented.  She is still living in independent living situation and he is thankful that his ex-wife is helping her. The client will focus on getting back into the gym focusing on his job.  He hopes to find placement for his son soon.  His positive cognition was, "I can do this."  Interventions: Solution-Oriented/Positive Psychology, Eye Movement Desensitization and Reprocessing (EMDR) and Insight-Oriented  Diagnosis:   ICD-10-CM   1. Generalized anxiety disorder F41.1   2. Moderate mixed bipolar I disorder ([place]) F31.62     Plan: Housing, self care, exercise.  Albertina Parr Alexius Hangart[place], [place]

## 2018-08-03 ENCOUNTER — Ambulatory Visit: Payer: BLUE CROSS/BLUE SHIELD | Admitting: Psychiatry

## 2018-08-03 DIAGNOSIS — J209 Acute bronchitis, unspecified: Secondary | ICD-10-CM | POA: Diagnosis not present

## 2018-08-16 ENCOUNTER — Ambulatory Visit: Payer: BLUE CROSS/BLUE SHIELD | Admitting: Psychiatry

## 2018-08-16 ENCOUNTER — Encounter: Payer: Self-pay | Admitting: Psychiatry

## 2018-08-16 DIAGNOSIS — F411 Generalized anxiety disorder: Secondary | ICD-10-CM
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
## 2018-09-09 ENCOUNTER — Ambulatory Visit: Payer: BLUE CROSS/BLUE SHIELD | Admitting: Psychiatry

## 2018-09-09 ENCOUNTER — Encounter: Payer: Self-pay | Admitting: Psychiatry

## 2018-09-09 DIAGNOSIS — F3162 Bipolar disorder, current episode mixed, moderate: Secondary | ICD-10-CM | POA: Diagnosis not present

## 2018-09-09 DIAGNOSIS — F411 Generalized anxiety disorder: Secondary | ICD-10-CM | POA: Diagnosis not present

## 2018-09-09 DIAGNOSIS — G3184 Mild cognitive impairment, so stated: Secondary | ICD-10-CM | POA: Diagnosis not present

## 2018-09-09 DIAGNOSIS — F5105 Insomnia due to other mental disorder: Secondary | ICD-10-CM | POA: Diagnosis not present
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
## 2018-09-15 ENCOUNTER — Ambulatory Visit: Payer: BLUE CROSS/BLUE SHIELD | Admitting: Psychiatry

## 2018-09-16 ENCOUNTER — Ambulatory Visit: Payer: BLUE CROSS/BLUE SHIELD | Admitting: Psychiatry

## 2018-09-16 ENCOUNTER — Encounter: Payer: Self-pay | Admitting: Psychiatry

## 2018-09-16 ENCOUNTER — Other Ambulatory Visit: Payer: Self-pay

## 2018-09-16 DIAGNOSIS — F411 Generalized anxiety disorder: Secondary | ICD-10-CM | POA: Diagnosis not present
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
## 2018-10-17 ENCOUNTER — Other Ambulatory Visit: Payer: Self-pay | Admitting: Psychiatry

## 2018-11-09 ENCOUNTER — Other Ambulatory Visit: Payer: Self-pay

## 2018-11-09 ENCOUNTER — Ambulatory Visit (INDEPENDENT_AMBULATORY_CARE_PROVIDER_SITE_OTHER): Payer: BLUE CROSS/BLUE SHIELD | Admitting: Psychiatry

## 2018-11-09 ENCOUNTER — Encounter

## 2018-11-09 ENCOUNTER — Encounter: Payer: Self-pay | Admitting: Psychiatry

## 2018-11-09 DIAGNOSIS — F411 Generalized anxiety disorder: Secondary | ICD-10-CM
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
## 2018-11-15 ENCOUNTER — Other Ambulatory Visit: Payer: Self-pay

## 2018-11-15 ENCOUNTER — Encounter: Payer: Self-pay | Admitting: Psychiatry

## 2018-11-15 ENCOUNTER — Ambulatory Visit (INDEPENDENT_AMBULATORY_CARE_PROVIDER_SITE_OTHER): Payer: BLUE CROSS/BLUE SHIELD | Admitting: Psychiatry

## 2018-11-15 DIAGNOSIS — F3162 Bipolar disorder, current episode mixed, moderate: Secondary | ICD-10-CM | POA: Diagnosis not present

## 2018-11-15 DIAGNOSIS — F5105 Insomnia due to other mental disorder: Secondary | ICD-10-CM

## 2018-11-15 DIAGNOSIS — F411 Generalized anxiety disorder: Secondary | ICD-10-CM | POA: Diagnosis not present

## 2018-11-15 DIAGNOSIS — G3184 Mild cognitive impairment, so stated: Secondary | ICD-10-CM

## 2018-11-15 MED ORDER — DOXAZOSIN MESYLATE 4 MG PO TABS: 4.0000 mg | ORAL_TABLET | Freq: Every day | ORAL | 1 refills | Status: DC
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
## 2018-11-22 ENCOUNTER — Encounter: Payer: Self-pay | Admitting: Psychiatry

## 2018-11-22 ENCOUNTER — Ambulatory Visit (INDEPENDENT_AMBULATORY_CARE_PROVIDER_SITE_OTHER): Payer: BLUE CROSS/BLUE SHIELD | Admitting: Psychiatry

## 2018-11-22 ENCOUNTER — Other Ambulatory Visit: Payer: Self-pay

## 2018-11-22 DIAGNOSIS — F411 Generalized anxiety disorder: Secondary | ICD-10-CM | POA: Diagnosis not present
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
## 2018-12-07 ENCOUNTER — Other Ambulatory Visit: Payer: Self-pay | Admitting: Emergency Medicine

## 2018-12-07 DIAGNOSIS — R6889 Other general symptoms and signs: Secondary | ICD-10-CM | POA: Diagnosis not present

## 2018-12-07 DIAGNOSIS — Z20822 Contact with and (suspected) exposure to covid-19: Secondary | ICD-10-CM

## 2018-12-09 LAB — NOVEL CORONAVIRUS, NAA: SARS-CoV-2, NAA: NOT DETECTED

## 2018-12-13 ENCOUNTER — Ambulatory Visit: Payer: BLUE CROSS/BLUE SHIELD | Admitting: Psychiatry

## 2018-12-16 ENCOUNTER — Other Ambulatory Visit: Payer: Self-pay | Admitting: Psychiatry

## 2018-12-27 ENCOUNTER — Other Ambulatory Visit: Payer: Self-pay

## 2018-12-27 ENCOUNTER — Ambulatory Visit (INDEPENDENT_AMBULATORY_CARE_PROVIDER_SITE_OTHER): Payer: BC Managed Care – PPO | Admitting: Psychiatry

## 2018-12-27 ENCOUNTER — Encounter: Payer: Self-pay | Admitting: Psychiatry

## 2018-12-27 DIAGNOSIS — F411 Generalized anxiety disorder: Secondary | ICD-10-CM
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
## 2018-12-28 ENCOUNTER — Other Ambulatory Visit: Payer: Self-pay | Admitting: Psychiatry

## 2019-01-04 ENCOUNTER — Other Ambulatory Visit: Payer: Self-pay | Admitting: Psychiatry

## 2019-01-11 ENCOUNTER — Encounter: Payer: Self-pay | Admitting: Psychiatry

## 2019-01-11 ENCOUNTER — Ambulatory Visit (INDEPENDENT_AMBULATORY_CARE_PROVIDER_SITE_OTHER): Payer: BC Managed Care – PPO | Admitting: Psychiatry

## 2019-01-11 ENCOUNTER — Other Ambulatory Visit: Payer: Self-pay

## 2019-01-11 DIAGNOSIS — F411 Generalized anxiety disorder: Secondary | ICD-10-CM | POA: Diagnosis not present
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
## 2019-01-14 ENCOUNTER — Other Ambulatory Visit: Payer: Self-pay | Admitting: Psychiatry

## 2019-01-25 ENCOUNTER — Encounter: Payer: Self-pay | Admitting: Psychiatry

## 2019-01-25 ENCOUNTER — Ambulatory Visit (INDEPENDENT_AMBULATORY_CARE_PROVIDER_SITE_OTHER): Payer: BC Managed Care – PPO | Admitting: Psychiatry

## 2019-01-25 ENCOUNTER — Other Ambulatory Visit: Payer: Self-pay

## 2019-01-25 DIAGNOSIS — F411 Generalized anxiety disorder: Secondary | ICD-10-CM | POA: Diagnosis not present

## 2019-01-25 DIAGNOSIS — F4321 Adjustment disorder with depressed mood: Secondary | ICD-10-CM

## 2019-01-25 NOTE — Progress Notes (Signed)
[facility] Counselor/Therapist Progress Note  Patient ID: Johnny [name], MRN: [id number],    Date: 01/25/2019  Time Spent: 50 minutes   Treatment Type: Individual Therapy  Reported Symptoms: anxiety, depressed mood.  Mental Status Exam:  Appearance:   Casual     Behavior:  Appropriate  Motor:  Normal  Speech/Language:   Clear and Coherent  Affect:  Appropriate  Mood:  anxious and sad  Thought process:  normal  Thought content:    WNL  Sensory/Perceptual disturbances:    WNL  Orientation:  oriented to person, place, time/date and situation  Attention:  Good  Concentration:  Good  Memory:  WNL  Fund of knowledge:   Good  Insight:    Good  Judgment:   Good  Impulse Control:  Good   Risk Assessment: Danger to Self:  No Self-injurious Behavior: No Danger to Others: No Duty to Warn:no Physical Aggression / Violence:No  Access to Firearms a concern: No  Gang Involvement:No   Subjective: I met with the client face-to-face.  We both had facemasks. The client is in the process of selling his mother's house.  He continues to care for her.  She recently had another stroke which prompted an EMS visit.  The client is very overstressed and sad about his mother's circumstances.  He states he prays for her constantly but is stressed that there is so much going on with her.  He will contact palliative care at [facility] to see if there are services available for her.  The client had planned to take his mom to an appointment with the ears nose and throat specialist.  His mom continues to have problems swallowing and they wanted to get that checked out.  He was afraid she would cancel it due to being sick which is what occurred.  He did set a boundary and asked her to call and cancel it herself rather than him doing it. The client has not heard from his disabled son in 63 weeks.  It seems his son has been able to maintain his placement with his one-on-one.  The client gets easily  overwhelmed when his son runs away or makes threats to others that are inappropriate. His wife is in the process of applying for a job with [employer].  If she gets that she will have a regular paycheck along with benefits.  The client states, "I have a hard time not feeling jealous."  The client has taking care of his wife for the 15 years of their marriage when she never worked.  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 I used the bilateral stimulation hand paddles with the client as he discussed these different issues.  His subjective units of distress started at a 6 and was less than 2 at the end of the session.  I used eye-movement around the difficulty the client has with his own finances.  He was able to significantly reduce his depressed mood as well to below 2.  He continues to pray, exercise and has increased his self-care.  He would like more of a social network but with the [place] pandemic that has become more difficult.  Positive cognition at the end of the session was, "I can do all things through [facility]."  "I can hold on with God's help."  Interventions: Assertiveness/Communication, Mindfulness Meditation, Motivational Interviewing, Solution-Oriented/Positive Psychology, [employer] Group Desensitization and Reprocessing (EMDR) and Insight-Oriented  Diagnosis:  ICD-10-CM   1. Generalized anxiety disorder  F41.1   2. Adjustment disorder with depressed mood  F43.21     Plan: Positive self talk, mood independent behavior, self-care, expand social network, boundaries, assertive communication.  This record has been created using [employer].  Chart creation errors have been sought, but [name] not always have been located and corrected. Such creation errors do not reflect on the standard of medical care.  Phoebe Marter, [facility]

## 2019-01-31 ENCOUNTER — Other Ambulatory Visit: Payer: Self-pay | Admitting: Psychiatry

## 2019-02-08 ENCOUNTER — Other Ambulatory Visit: Payer: Self-pay

## 2019-02-08 ENCOUNTER — Ambulatory Visit (INDEPENDENT_AMBULATORY_CARE_PROVIDER_SITE_OTHER): Payer: BC Managed Care – PPO | Admitting: Psychiatry

## 2019-02-08 ENCOUNTER — Encounter: Payer: Self-pay | Admitting: Psychiatry

## 2019-02-08 DIAGNOSIS — F411 Generalized anxiety disorder: Secondary | ICD-10-CM

## 2019-02-08 NOTE — Progress Notes (Signed)
      [facility] Counselor/Therapist Progress Note  Patient ID: JASSIAH VIVIANO, MRN: [id number],    Date: 02/08/2019  Time Spent: 50 minutes   Treatment Type: Individual Therapy  Reported Symptoms: anxious  Mental Status Exam:  Appearance:   Casual     Behavior:  Appropriate  Motor:  Normal  Speech/Language:   Clear and Coherent  Affect:  Appropriate  Mood:  anxious  Thought process:  normal  Thought content:    WNL  Sensory/Perceptual disturbances:    WNL  Orientation:  oriented to person, place, time/date and situation  Attention:  Good  Concentration:  Good  Memory:  WNL  Fund of knowledge:   Good  Insight:    Good  Judgment:   Good  Impulse Control:  Good   Risk Assessment: Danger to Self:  No Self-injurious Behavior: No Danger to Others: No Duty to Warn:no Physical Aggression / Violence:No  Access to Firearms a concern: No  Gang Involvement:No   Subjective: The client is concerned about his finances.  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  She has been out of work for a while. The client's mother had another stroke which stresses him out and raises his anxiety.  He knows it is just a matter of time before she passes.  Recently she fell and struck her head on the shower stall.  She was not transported to the hospital but was stabilized at home due to the COVID-19 pandemic.  [name] has also had a hard week.  His one-on-one during the day has a difficult time finding things to do.  Since nothing is open it has made things more complicated. 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  This would save him quite a bit of money and solve some of his cash flow issues.  He continues to exercise working on assertiveness and boundaries.  Interventions: Assertiveness/Communication, Motivational  Interviewing, Solution-Oriented/Positive Psychology, [employer] Group Desensitization and Reprocessing (EMDR) and Insight-Oriented  Diagnosis:   ICD-10-CM   1. Generalized anxiety disorder  F41.1     Plan: Assertiveness, boundaries, exercise, self-care, radical acceptance.  This record has been created using [employer].  Chart creation errors have been sought, but Jep Dyas not always have been located and corrected. Such creation errors do not reflect on the standard of medical care.  Dayson Aboud, [facility]

## 2019-03-01 ENCOUNTER — Ambulatory Visit (INDEPENDENT_AMBULATORY_CARE_PROVIDER_SITE_OTHER): Payer: BC Managed Care – PPO | Admitting: Psychiatry

## 2019-03-01 ENCOUNTER — Other Ambulatory Visit: Payer: Self-pay

## 2019-03-01 ENCOUNTER — Encounter: Payer: Self-pay | Admitting: Psychiatry

## 2019-03-01 DIAGNOSIS — F4321 Adjustment disorder with depressed mood: Secondary | ICD-10-CM | POA: Diagnosis not present

## 2019-03-01 DIAGNOSIS — F411 Generalized anxiety disorder: Secondary | ICD-10-CM | POA: Diagnosis not present

## 2019-03-01 NOTE — Progress Notes (Signed)
      [facility] Counselor/Therapist Progress Note  Patient ID: PHIL KOZAR, MRN: [id number],    Date: 03/01/2019  Time Spent: 50 minutes   Treatment Type: Individual Therapy  Reported Symptoms: depressed, anxious.  Mental Status Exam:  Appearance:   Casual     Behavior:  Appropriate  Motor:  Normal  Speech/Language:   Clear and Coherent  Affect:  Appropriate  Mood:  anxious and sad  Thought process:  normal  Thought content:    WNL  Sensory/Perceptual disturbances:    WNL  Orientation:  oriented to person, place, time/date and situation  Attention:  Good  Concentration:  Good  Memory:  WNL  Fund of knowledge:   Good  Insight:    Good  Judgment:   Good  Impulse Control:  Good   Risk Assessment: Danger to Self:  No Self-injurious Behavior: No Danger to Others: No Duty to Warn:no Physical Aggression / Violence:No  Access to Firearms a concern: No  Gang Involvement:No   Subjective: "I am trying not to be depressed."  The client's mother turns 68 on Thursday and it is also the 2-year anniversary of his divorce.  "I am still paying lawyers."  The client also recently found out that when his wife was working in their company she never collected $26,000 for work he had done. I used eye-movement with the client on the grief and loss of the marriage, his mom's decline, his son's issues.  His negative cognition is, "I am overwhelmed."  He feels sadness in his chest.  His subjective units of distress is an 8+.  As the client processed he clearly became sadder and more tearful.  "I need tranquility in more laughter in my life.  As he continued to process he found himself moving to a place of acceptance with where he is.  He knows he needs to sell his house so he can move forward in his life.  He has been having stress dreams of using alcohol.  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  He agreed that he would go to the local bog garden with his Bible.  This was a practice he  did years ago that he found very helpful.  He agreed to implement this again.  As the client processed he clearly became calmer.  His subjective units of distress was less than 1 at the end of the session.  Interventions: Assertiveness/Communication, Motivational Interviewing, Solution-Oriented/Positive Psychology, [employer] Group Desensitization and Reprocessing (EMDR) and Insight-Oriented  Diagnosis:   ICD-10-CM   1. Generalized anxiety disorder  F41.1   2. Adjustment disorder with depressed mood  F43.21     Plan: Self-care, positive self talk, exercise, prayer and meditation, assertiveness, boundaries.  Jizel Cheeks, [facility]

## 2019-03-08 ENCOUNTER — Other Ambulatory Visit: Payer: Self-pay

## 2019-03-08 ENCOUNTER — Encounter: Payer: Self-pay | Admitting: Psychiatry

## 2019-03-08 ENCOUNTER — Ambulatory Visit: Payer: BC Managed Care – PPO | Admitting: Psychiatry

## 2019-03-08 DIAGNOSIS — F5105 Insomnia due to other mental disorder: Secondary | ICD-10-CM | POA: Diagnosis not present

## 2019-03-08 DIAGNOSIS — G4710 Hypersomnia, unspecified: Secondary | ICD-10-CM

## 2019-03-08 DIAGNOSIS — G4730 Sleep apnea, unspecified: Secondary | ICD-10-CM

## 2019-03-08 DIAGNOSIS — F3162 Bipolar disorder, current episode mixed, moderate: Secondary | ICD-10-CM | POA: Diagnosis not present

## 2019-03-08 DIAGNOSIS — F411 Generalized anxiety disorder: Secondary | ICD-10-CM | POA: Diagnosis not present

## 2019-03-08 NOTE — Patient Instructions (Addendum)
Reduce doxazosin to 1/2 tablet at night for 4 nights and evaluate if the hangover is better.  If the hangover is not better then call.  Pay attention to whether the sweats are any worse or not.  If the hangover is better but insomnia is worse, then add DayVigo 1 at night.
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
## 2019-03-12 ENCOUNTER — Other Ambulatory Visit: Payer: Self-pay | Admitting: Psychiatry

## 2019-03-14 ENCOUNTER — Other Ambulatory Visit: Payer: Self-pay | Admitting: Psychiatry

## 2019-03-15 ENCOUNTER — Ambulatory Visit (INDEPENDENT_AMBULATORY_CARE_PROVIDER_SITE_OTHER): Payer: BC Managed Care – PPO | Admitting: Psychiatry

## 2019-03-15 ENCOUNTER — Encounter: Payer: Self-pay | Admitting: Psychiatry

## 2019-03-15 ENCOUNTER — Other Ambulatory Visit: Payer: Self-pay

## 2019-03-15 DIAGNOSIS — F3162 Bipolar disorder, current episode mixed, moderate: Secondary | ICD-10-CM | POA: Diagnosis not present
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
## 2019-03-16 ENCOUNTER — Other Ambulatory Visit: Payer: Self-pay | Admitting: Psychiatry

## 2019-03-16 NOTE — Telephone Encounter (Signed)
Should be filled monthly or ? Last fill 08/21 Has appt in Oct.

## 2019-03-23 ENCOUNTER — Telehealth: Payer: Self-pay

## 2019-03-23 NOTE — Telephone Encounter (Signed)
So do understand he was calling to give the information and not necessarily wanting any changes made today?

## 2019-03-23 NOTE — Telephone Encounter (Signed)
Pt. Called to say he has stopped taking the Cardura due to side effects per your request. He was having bad wake up hangovers, waking up during the night and did not have any energy. Samples of 5 Mg Dayvigo were given at last visit. Pt. Reports that this medicine is working much better. He has been sleeping better and does not have the sleep hangovers as bad. The only issue he is having is he still cannot fall asleep. He hasn't been able to fall asleep until about 1-2 am sometimes 3 am. Not really staying asleep but does feel that this medication will be beneficial and he will call back next week to let us know if there is any improvement.

## 2019-03-23 NOTE — Telephone Encounter (Signed)
Correct. He wanted to update you on how things were going.

## 2019-03-28 ENCOUNTER — Encounter: Payer: Self-pay | Admitting: Psychiatry

## 2019-03-28 ENCOUNTER — Other Ambulatory Visit: Payer: Self-pay

## 2019-03-28 ENCOUNTER — Ambulatory Visit: Payer: BC Managed Care – PPO | Admitting: Psychiatry

## 2019-03-28 DIAGNOSIS — F411 Generalized anxiety disorder: Secondary | ICD-10-CM | POA: Diagnosis not present

## 2019-03-28 NOTE — Progress Notes (Signed)
      [facility] Counselor/Therapist Progress Note  Patient ID: Johnny [name], MRN: [id number],    Date: 03/28/2019  Time Spent: 56 minutes   Treatment Type: Individual Therapy  Reported Symptoms: anxious  Mental Status Exam:  Appearance:   Casual     Behavior:  Appropriate  Motor:  Normal  Speech/Language:   Clear and Coherent  Affect:  Appropriate  Mood:  anxious  Thought process:  normal  Thought content:    WNL  Sensory/Perceptual disturbances:    WNL  Orientation:  oriented to person, place, time/date and situation  Attention:  Good  Concentration:  Good  Memory:  WNL  Fund of knowledge:   Good  Insight:    Good  Judgment:   Good  Impulse Control:  Good   Risk Assessment: Danger to Self:  No Self-injurious Behavior: No Danger to Others: No Duty to Warn:no Physical Aggression / Violence:No  Access to Firearms a concern: No  Gang Involvement:No   Subjective: The client states that he recently 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  This is been a welcome change for the client.  They are very compatible with many similar interests.  It has given him a respite from the other areas of his life that are more difficult. The client's mother continues to call at all hours of the day and night.  She is usually disoriented and very distraught.  The client spends a lot of time calming her down and trying to get her needs met.  He is thankful that his ex-wife will pinch hit with his mother.  He is very worried about her slow decline.  He gets concerned when he sees the EMS at her facility.  "I dread the day my mother goes out in one of those ambulances."  He was recently able to bring her briefly by the house she used to live in.  She was very pleased with how he is kept it up and the updates he has done. [name] has been stable in his placement.  The client is grateful that his son's one-on-one is so competent.  The client has  been concerned that with the change in season his son Javaris Wigington act out again. As the client discussed all these issues his subjective units of distress went from 6+ to less than 2. The client has increased his social network with this new relationship.  He has been increasing his exercise.  He is also working with his accountant to get his taxes in order so that he can get preapproved for buying a new house.  Interventions: Assertiveness/Communication, Motivational Interviewing, Solution-Oriented/Positive Psychology and Insight-Oriented  Diagnosis:   ICD-10-CM   1. Generalized anxiety disorder  F41.1     Plan: Social network, exercise, self-care, assertiveness, boundaries.  Maryanna Stuber, [facility]

## 2019-03-31 ENCOUNTER — Other Ambulatory Visit: Payer: Self-pay | Admitting: Psychiatry

## 2019-03-31 ENCOUNTER — Other Ambulatory Visit: Payer: Self-pay

## 2019-03-31 ENCOUNTER — Telehealth: Payer: Self-pay

## 2019-03-31 MED ORDER — DAYVIGO 5 MG PO TABS: 5.0000 mg | ORAL_TABLET | Freq: Every day | ORAL | 1 refills | Status: DC

## 2019-03-31 NOTE — Telephone Encounter (Signed)
Given samples of 5 Mg Dayvigo x 1 daily at last visit. It is working really well for him. He would like an Rx sent to [facility] at [facility] please.

## 2019-03-31 NOTE — Telephone Encounter (Signed)
Left pt. A VM to return my call.

## 2019-03-31 NOTE — Telephone Encounter (Signed)
Pended for approval.

## 2019-03-31 NOTE — Telephone Encounter (Signed)
Prescription sent.  Make sure he knows he needs to use the discount card.  Hopefully we gave him 1 at his last visit if not he will need to come by and get 1 or else he can probably download it from the Internet.  It will not be affordable otherwise.

## 2019-04-06 ENCOUNTER — Other Ambulatory Visit: Payer: Self-pay | Admitting: Psychiatry

## 2019-04-12 ENCOUNTER — Ambulatory Visit: Payer: BC Managed Care – PPO | Admitting: Psychiatry

## 2019-04-18 ENCOUNTER — Ambulatory Visit: Payer: BC Managed Care – PPO | Admitting: Psychiatry

## 2019-04-28 ENCOUNTER — Ambulatory Visit: Payer: BC Managed Care – PPO | Admitting: Psychiatry

## 2019-05-05 ENCOUNTER — Other Ambulatory Visit: Payer: Self-pay | Admitting: Psychiatry

## 2019-05-05 NOTE — Telephone Encounter (Signed)
Last note 09/01 mentions decrease to 1/day and 2/day with adding doxazosin?

## 2019-05-11 ENCOUNTER — Other Ambulatory Visit: Payer: Self-pay | Admitting: Psychiatry

## 2019-05-12 ENCOUNTER — Other Ambulatory Visit: Payer: Self-pay

## 2019-05-12 ENCOUNTER — Encounter: Payer: Self-pay | Admitting: Psychiatry

## 2019-05-12 ENCOUNTER — Ambulatory Visit (INDEPENDENT_AMBULATORY_CARE_PROVIDER_SITE_OTHER): Payer: BC Managed Care – PPO | Admitting: Psychiatry

## 2019-05-12 DIAGNOSIS — F411 Generalized anxiety disorder: Secondary | ICD-10-CM | POA: Diagnosis not present
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
## 2019-05-16 ENCOUNTER — Other Ambulatory Visit: Payer: Self-pay

## 2019-05-16 ENCOUNTER — Ambulatory Visit (INDEPENDENT_AMBULATORY_CARE_PROVIDER_SITE_OTHER): Payer: BC Managed Care – PPO | Admitting: Psychiatry

## 2019-05-16 ENCOUNTER — Encounter: Payer: Self-pay | Admitting: Psychiatry

## 2019-05-16 DIAGNOSIS — F5105 Insomnia due to other mental disorder: Secondary | ICD-10-CM

## 2019-05-16 DIAGNOSIS — G3184 Mild cognitive impairment, so stated: Secondary | ICD-10-CM

## 2019-05-16 DIAGNOSIS — G4710 Hypersomnia, unspecified: Secondary | ICD-10-CM

## 2019-05-16 DIAGNOSIS — G4730 Sleep apnea, unspecified: Secondary | ICD-10-CM

## 2019-05-16 DIAGNOSIS — F3162 Bipolar disorder, current episode mixed, moderate: Secondary | ICD-10-CM | POA: Diagnosis not present
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
## 2019-05-26 ENCOUNTER — Ambulatory Visit (INDEPENDENT_AMBULATORY_CARE_PROVIDER_SITE_OTHER): Payer: BC Managed Care – PPO | Admitting: Psychiatry

## 2019-05-26 ENCOUNTER — Encounter: Payer: Self-pay | Admitting: Psychiatry

## 2019-05-26 ENCOUNTER — Other Ambulatory Visit: Payer: Self-pay

## 2019-05-26 DIAGNOSIS — F3162 Bipolar disorder, current episode mixed, moderate: Secondary | ICD-10-CM
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
## 2019-05-29 DIAGNOSIS — Z20828 Contact with and (suspected) exposure to other viral communicable diseases: Secondary | ICD-10-CM | POA: Diagnosis not present

## 2019-06-06 ENCOUNTER — Other Ambulatory Visit: Payer: Self-pay | Admitting: Psychiatry

## 2019-06-06 NOTE — Telephone Encounter (Signed)
Next apt 07/2019

## 2019-06-11 ENCOUNTER — Other Ambulatory Visit: Payer: Self-pay | Admitting: Psychiatry

## 2019-06-14 ENCOUNTER — Ambulatory Visit: Payer: BC Managed Care – PPO | Admitting: Psychiatry

## 2019-06-20 ENCOUNTER — Other Ambulatory Visit: Payer: Self-pay | Admitting: Psychiatry

## 2019-06-25 ENCOUNTER — Other Ambulatory Visit: Payer: Self-pay | Admitting: Psychiatry

## 2019-06-27 ENCOUNTER — Other Ambulatory Visit: Payer: Self-pay | Admitting: Psychiatry

## 2019-07-03 ENCOUNTER — Other Ambulatory Visit: Payer: Self-pay | Admitting: Psychiatry

## 2019-07-05 ENCOUNTER — Other Ambulatory Visit: Payer: Self-pay

## 2019-07-05 ENCOUNTER — Encounter: Payer: Self-pay | Admitting: Psychiatry

## 2019-07-05 ENCOUNTER — Ambulatory Visit (INDEPENDENT_AMBULATORY_CARE_PROVIDER_SITE_OTHER): Payer: BC Managed Care – PPO | Admitting: Psychiatry

## 2019-07-05 DIAGNOSIS — F3162 Bipolar disorder, current episode mixed, moderate: Secondary | ICD-10-CM

## 2019-07-05 NOTE — Progress Notes (Signed)
      [facility] Counselor/Therapist Progress Note  Patient ID: Johnny [name], MRN: [id number],    Date: 07/05/2019  Time Spent: 50 minutes   Treatment Type: Individual Therapy  Reported Symptoms: sad  Mental Status Exam:  Appearance:   Casual     Behavior:  Appropriate  Motor:  Normal  Speech/Language:   Clear and Coherent  Affect:  Appropriate  Mood:  sad  Thought process:  normal  Thought content:    WNL  Sensory/Perceptual disturbances:    WNL  Orientation:  oriented to person, place, time/date and situation  Attention:  Good  Concentration:  Good  Memory:  WNL  Fund of knowledge:   Good  Insight:    Good  Judgment:   Good  Impulse Control:  Good   Risk Assessment: Danger to Self:  No Self-injurious Behavior: No Danger to Others: No Duty to Warn:no Physical Aggression / Violence:No  Access to Firearms a concern: No  Gang Involvement:No   Subjective: The client states that he had a very nice Christmas.  His daughter and husband came to visit him from [place].  "It was a pleasant time."  He had some time with his girlfriend which was very positive.  He is touched that she cares deeply about him.  "I do not think I have really had that in my life." The client's biggest issue today was the sadness that he has around his mom.  He is having a very difficult time with her.  Her ability to care for herself is declining.  He went into her apartment to find 2 trash bags full of used depends.  He stated the smell was atrocious.  He tries to check in on her daily but she calls him at all times of the day and night.  We discussed the fact that he would have to have radical acceptance with where his mother is right now.  He agreed that it was a difficult time.  He will try to have more understanding.  We discussed the use of mood independent behavior.  The client felt like he could do this.  He also stated he would contact the staff at the independent living facility to see if they  would look in on her periodically as well.   Interventions: Assertiveness/Communication, Solution-Oriented/Positive Psychology and Insight-Oriented  Diagnosis:   ICD-10-CM   1. Moderate mixed bipolar I disorder (HCC)  F31.62     Plan: Mood independent behavior, radical acceptance, boundaries, self-care, positive self talk.  Varie Machamer, [facility]

## 2019-07-18 ENCOUNTER — Encounter: Payer: Self-pay | Admitting: Psychiatry

## 2019-07-18 ENCOUNTER — Ambulatory Visit (INDEPENDENT_AMBULATORY_CARE_PROVIDER_SITE_OTHER): Payer: BC Managed Care – PPO | Admitting: Psychiatry

## 2019-07-18 ENCOUNTER — Other Ambulatory Visit: Payer: Self-pay

## 2019-07-18 DIAGNOSIS — F3162 Bipolar disorder, current episode mixed, moderate: Secondary | ICD-10-CM

## 2019-07-18 DIAGNOSIS — N521 Erectile dysfunction due to diseases classified elsewhere: Secondary | ICD-10-CM | POA: Diagnosis not present

## 2019-07-18 DIAGNOSIS — F5105 Insomnia due to other mental disorder: Secondary | ICD-10-CM

## 2019-07-18 DIAGNOSIS — F411 Generalized anxiety disorder: Secondary | ICD-10-CM

## 2019-07-18 DIAGNOSIS — G4730 Sleep apnea, unspecified: Secondary | ICD-10-CM

## 2019-07-18 DIAGNOSIS — G4710 Hypersomnia, unspecified: Secondary | ICD-10-CM

## 2019-07-18 MED ORDER — SILDENAFIL CITRATE 100 MG PO TABS: 100.0000 mg | ORAL_TABLET | Freq: Every day | ORAL | 2 refills | Status: DC | PRN
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
## 2019-07-21 ENCOUNTER — Encounter: Payer: Self-pay | Admitting: Psychiatry

## 2019-07-21 ENCOUNTER — Ambulatory Visit (INDEPENDENT_AMBULATORY_CARE_PROVIDER_SITE_OTHER): Payer: BC Managed Care – PPO | Admitting: Psychiatry

## 2019-07-21 ENCOUNTER — Other Ambulatory Visit: Payer: Self-pay

## 2019-07-21 DIAGNOSIS — F3162 Bipolar disorder, current episode mixed, moderate: Secondary | ICD-10-CM | POA: Diagnosis not present

## 2019-07-21 NOTE — Progress Notes (Signed)
      [facility] Counselor/Therapist Progress Note  Patient ID: Johnny [name], MRN: [id number],    Date: 07/21/2019  Time Spent: 50 minutes   Treatment Type: Individual Therapy  Reported Symptoms: depressed  Mental Status Exam:  Appearance:   Casual     Behavior:  Appropriate  Motor:  Normal  Speech/Language:   Clear and Coherent  Affect:  Appropriate  Mood:  depressed  Thought process:  normal  Thought content:    WNL  Sensory/Perceptual disturbances:    WNL  Orientation:  oriented to person, place, time/date and situation  Attention:  Good  Concentration:  Good  Memory:  WNL  Fund of knowledge:   Good  Insight:    Good  Judgment:   Good  Impulse Control:  Good   Risk Assessment: Danger to Self:  No Self-injurious Behavior: No Danger to Others: No Duty to Warn:no Physical Aggression / Violence:No  Access to Firearms a concern: No  Gang Involvement:No   Subjective: The client states that his mother brings him the most anxiety.  She calls him at all hours of the day although he has asked her not to.  "It's very stressful."  Today we used eye-movement focusing on his mother.  His negative cognition is, "I'm overwhelmed".  He feels stress and anxiety in his chest.  His subjective units of distress equals seven.  As the client processed he stated that, "I know my mother can't help it."  He knows she is having a hard time in these last days of her life.  She has become more confused and has more difficulty getting around her apartment.  His ex-wife continues to be kind to her helping her as she can.  As the client continued to process, issues came up around his daughter who lives in [place] with her husband.  They recently bought a house that was almost $300,000.  The client struggles financially and is actually paying one of his daughters student loans.  As he processed he became more angry that she has access to financial resources and he does not.Marland Kitchen  He realized that being  angry with her will ultimately hurt the relationship just like being angry with his mom will hurt that relationship.  As he gained this realization his anger decreased and his stressed drop significantly.  He knows he needs to practice more mood independent behavior.  He also is working on radically accepting where his mom is and how this process is going to continue to be difficult.  His positive cognition is, "I can be at peace."  Interventions: Assertiveness/Communication, Mindfulness Meditation, Solution-Oriented/Positive Psychology, Eye Movement Desensitization and Reprocessing (EMDR) and Insight-Oriented  Diagnosis:   ICD-10-CM   1. Moderate mixed bipolar I disorder (HCC)  F31.62     Plan: Radical acceptance, mood independent behavior, positive self talk, assertiveness, boundaries.  Captola Teschner, [facility]

## 2019-08-04 ENCOUNTER — Ambulatory Visit: Payer: BC Managed Care – PPO | Admitting: Psychiatry

## 2019-08-18 ENCOUNTER — Other Ambulatory Visit: Payer: Self-pay

## 2019-08-18 ENCOUNTER — Ambulatory Visit (INDEPENDENT_AMBULATORY_CARE_PROVIDER_SITE_OTHER): Payer: BC Managed Care – PPO | Admitting: Psychiatry

## 2019-08-18 ENCOUNTER — Encounter: Payer: Self-pay | Admitting: Psychiatry

## 2019-08-18 DIAGNOSIS — F3162 Bipolar disorder, current episode mixed, moderate: Secondary | ICD-10-CM | POA: Diagnosis not present

## 2019-08-18 NOTE — Progress Notes (Signed)
      [facility] Counselor/Therapist Progress Note  Patient ID: DEEP SIMERSON, MRN: [id number],    Date: 08/18/2019  Time Spent: 50 minutes   Treatment Type: Individual Therapy  Reported Symptoms: sad, stressed  Mental Status Exam:  Appearance:   Casual     Behavior:  Appropriate  Motor:  Normal  Speech/Language:   Clear and Coherent  Affect:  Appropriate  Mood:  sad  Thought process:  normal  Thought content:    WNL  Sensory/Perceptual disturbances:    WNL  Orientation:  oriented to person, place, time/date and situation  Attention:  Good  Concentration:  Good  Memory:  WNL  Fund of knowledge:   Good  Insight:    Good  Judgment:   Good  Impulse Control:  Good   Risk Assessment: Danger to Self:  No Self-injurious Behavior: No Danger to Others: No Duty to Warn:no Physical Aggression / Violence:No  Access to Firearms a concern: No  Gang Involvement:No   Subjective: The client states that his mother continues to stress him out.  He had gone out to dinner with a friend and deliberately left his phone at home.  During the 2-hour dinner his mother called him 11 times.  He continues to try to set boundaries with her but when she gets frightened it overrides everything.  The client is trying to have understanding and grace towards his mom with this. The client also has received some text messages from his girlfriend.  These have left him sad and bewildered.  She has started a job in [place] working the night shift at a television station.  Because of the schedule change they have not been able to spend as much time together.  Once she is home she is exhausted.  The client is having understanding towards this.  We discussed the nature of her texts.  I pointed out to the client that these are coming in the middle of the night when she is tired and overwhelmed at her job.  Also her circadian rhythm is completely thrown off.  As a result she is probably more depressed and looking at the  relationship through the lens of her previous relationships.  Some of the things that she was saying about him seem to be more true about her previous relationships than with the client.  I gave the client a handout on thinking errors to review.  He can use this as a way to recast the conversation.  She is assigning intent to things that she has interpreted which can lead to very negative interactions.  The client agrees and seeks to address this face-to-face with her.  He will take a nonjudgmental stance going forward.  Interventions: Assertiveness/Communication, Mindfulness Meditation, Motivational Interviewing, Solution-Oriented/Positive Psychology and Insight-Oriented  Diagnosis:   ICD-10-CM   1. Moderate mixed bipolar I disorder (HCC)  F31.62     Plan: Mood independent behavior, nonjudgmental stance, positive self talk, assertiveness, boundaries, self-care, journaling.  Karizma Cheek, [facility]

## 2019-08-22 ENCOUNTER — Other Ambulatory Visit: Payer: Self-pay | Admitting: Psychiatry

## 2019-08-28 ENCOUNTER — Other Ambulatory Visit: Payer: Self-pay | Admitting: Psychiatry

## 2019-09-01 ENCOUNTER — Other Ambulatory Visit: Payer: Self-pay

## 2019-09-01 ENCOUNTER — Ambulatory Visit (INDEPENDENT_AMBULATORY_CARE_PROVIDER_SITE_OTHER): Payer: BC Managed Care – PPO | Admitting: Psychiatry

## 2019-09-01 ENCOUNTER — Encounter: Payer: Self-pay | Admitting: Psychiatry

## 2019-09-01 DIAGNOSIS — F3162 Bipolar disorder, current episode mixed, moderate: Secondary | ICD-10-CM | POA: Diagnosis not present

## 2019-09-01 NOTE — Progress Notes (Signed)
      [facility] Counselor/Therapist Progress Note  Patient ID: Johnny [name], MRN: [id number],    Date: 09/01/2019  Time Spent: 50 minutes   Treatment Type: Individual Therapy  Reported Symptoms: sad, anxious  Mental Status Exam:  Appearance:   Casual     Behavior:  Appropriate  Motor:  Normal  Speech/Language:   Clear and Coherent  Affect:  Appropriate  Mood:  anxious and sad  Thought process:  normal  Thought content:    WNL  Sensory/Perceptual disturbances:    WNL  Orientation:  oriented to person, place, time/date and situation  Attention:  Good  Concentration:  Good  Memory:  WNL  Fund of knowledge:   Good  Insight:    Good  Judgment:   Good  Impulse Control:  Good   Risk Assessment: Danger to Self:  No Self-injurious Behavior: No Danger to Others: No Duty to Warn:no Physical Aggression / Violence:No  Access to Firearms a concern: No  Gang Involvement:No   Subjective: The client is very excited that he has been approved for a mortgage.  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 The client was sad and anxious today.  He had written out some thoughts about his relationship with his girlfriend.  She has recently started new job in [place], [place].  She has to drive 15 hours a week and work 10 hours a day.  He states she is exhausted and out of gas.  A lot of the things that she has said to him sound like she is projecting the expectation of rejection by him.  The client agrees with this.  I went over common thinking errors and gave the client a handout.  Interpreting and mind-reading never work and relationships.  I also explained that communicating by innuendo is always disastrous.  The client agrees.  He plans on making some overtures to her to neutralize her concerns without overtly doing that.  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  In these he will communicate his commitment and desire to be with her.  The client thinks this is a great idea and Una Yeomans also do a video.  Interventions: Assertiveness/Communication, Mindfulness Meditation, Motivational Interviewing, Solution-Oriented/Positive Psychology and Insight-Oriented  Diagnosis:   ICD-10-CM   1. Moderate mixed bipolar I disorder (HCC)  F31.62     Plan: Audio diary, video, assertiveness, boundaries, letter writing, mood independent behavior, positive self talk.  Sumiye Hirth, [facility]

## 2019-09-04 ENCOUNTER — Other Ambulatory Visit: Payer: Self-pay | Admitting: Psychiatry

## 2019-09-12 ENCOUNTER — Other Ambulatory Visit: Payer: Self-pay | Admitting: Dermatology

## 2019-09-12 DIAGNOSIS — D0462 Carcinoma in situ of skin of left upper limb, including shoulder: Secondary | ICD-10-CM | POA: Diagnosis not present

## 2019-09-12 DIAGNOSIS — B079 Viral wart, unspecified: Secondary | ICD-10-CM | POA: Diagnosis not present

## 2019-09-12 DIAGNOSIS — L821 Other seborrheic keratosis: Secondary | ICD-10-CM | POA: Diagnosis not present

## 2019-09-12 DIAGNOSIS — C4492 Squamous cell carcinoma of skin, unspecified: Secondary | ICD-10-CM

## 2019-09-12 DIAGNOSIS — D229 Melanocytic nevi, unspecified: Secondary | ICD-10-CM | POA: Diagnosis not present

## 2019-09-12 HISTORY — DX: Squamous cell carcinoma of skin, unspecified: C44.92

## 2019-09-14 ENCOUNTER — Ambulatory Visit (INDEPENDENT_AMBULATORY_CARE_PROVIDER_SITE_OTHER): Payer: BC Managed Care – PPO | Admitting: Psychiatry

## 2019-09-14 ENCOUNTER — Encounter: Payer: Self-pay | Admitting: Psychiatry

## 2019-09-14 ENCOUNTER — Other Ambulatory Visit: Payer: Self-pay

## 2019-09-14 DIAGNOSIS — F411 Generalized anxiety disorder: Secondary | ICD-10-CM

## 2019-09-14 DIAGNOSIS — G4730 Sleep apnea, unspecified: Secondary | ICD-10-CM

## 2019-09-14 DIAGNOSIS — G3184 Mild cognitive impairment, so stated: Secondary | ICD-10-CM

## 2019-09-14 DIAGNOSIS — N521 Erectile dysfunction due to diseases classified elsewhere: Secondary | ICD-10-CM

## 2019-09-14 DIAGNOSIS — G4710 Hypersomnia, unspecified: Secondary | ICD-10-CM

## 2019-09-14 DIAGNOSIS — F3162 Bipolar disorder, current episode mixed, moderate: Secondary | ICD-10-CM | POA: Diagnosis not present

## 2019-09-14 DIAGNOSIS — F5105 Insomnia due to other mental disorder: Secondary | ICD-10-CM | POA: Diagnosis not present
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
## 2019-09-15 ENCOUNTER — Ambulatory Visit (INDEPENDENT_AMBULATORY_CARE_PROVIDER_SITE_OTHER): Payer: BC Managed Care – PPO | Admitting: Psychiatry

## 2019-09-15 ENCOUNTER — Encounter: Payer: Self-pay | Admitting: Psychiatry

## 2019-09-15 DIAGNOSIS — F411 Generalized anxiety disorder: Secondary | ICD-10-CM | POA: Diagnosis not present

## 2019-09-15 NOTE — Progress Notes (Signed)
      [facility] Counselor/Therapist Progress Note  Patient ID: Johnny [name], MRN: [id number],    Date: 09/15/2019  Time Spent: 50 minutes   Treatment Type: Individual Therapy  Reported Symptoms: anxious  Mental Status Exam:  Appearance:   Casual     Behavior:  Appropriate  Motor:  Normal  Speech/Language:   Clear and Coherent  Affect:  Appropriate  Mood:  anxious  Thought process:  normal  Thought content:    WNL  Sensory/Perceptual disturbances:    WNL  Orientation:  oriented to person, place, time/date and situation  Attention:  Good  Concentration:  Good  Memory:  WNL  Fund of knowledge:   Good  Insight:    Good  Judgment:   Good  Impulse Control:  Good   Risk Assessment: Danger to Self:  No Self-injurious Behavior: No Danger to Others: No Duty to Warn:no Physical Aggression / Violence:No  Access to Firearms a concern: No  Gang Involvement:No   Subjective: The client is in the process of trying to find a house to live in.  He is getting ready to list his own house and put it on the market.  This is causing him some anxiety but he is using his skills to manage it.  The client's main concern today is his relationship with his girlfriend.  Since she has started a job in the city of [place] at a television station their schedules have been the opposite of each other.  This has not allowed for them to be together.  His girlfriend has begun to text him implying that he does not need to be with her.  She says things that indicate he is not interested in her.  The fact that they have not been intimate with each other indicates he is not interested.  All of this is not true.  I discussed with the client that her fear of rejection and disconnect that she has due to their schedules is probably fueling these responses.  The client agrees. He continues to try to engage her and make it clear that she is not responsible for the decisions that he makes in his life.  He is trying to  continue to pursue her and not allowing the relationship to falter.  He is working on being assertive with clear communication.  He is using boundaries and intentionality.  Interventions: Assertiveness/Communication, Motivational Interviewing, Solution-Oriented/Positive Psychology and Insight-Oriented  Diagnosis:   ICD-10-CM   1. Generalized anxiety disorder  F41.1     Plan: Boundaries, intentionality, assertiveness, self-care, positive self talk, exercise.  Shawanda Sievert, [facility]

## 2019-09-27 ENCOUNTER — Telehealth: Payer: Self-pay

## 2019-09-27 NOTE — Telephone Encounter (Signed)
Phone call to patient to give his Pathology results and schedule appointment with Dr. Denna Haggard.  Patient aware of pathology result, appointment schedule with Dr. Denna Haggard on 10/20/2019 @ 3pm.

## 2019-09-29 ENCOUNTER — Encounter: Payer: Self-pay | Admitting: Psychiatry

## 2019-09-29 ENCOUNTER — Ambulatory Visit (INDEPENDENT_AMBULATORY_CARE_PROVIDER_SITE_OTHER): Payer: BC Managed Care – PPO | Admitting: Psychiatry

## 2019-09-29 ENCOUNTER — Other Ambulatory Visit: Payer: Self-pay

## 2019-09-29 DIAGNOSIS — F4323 Adjustment disorder with mixed anxiety and depressed mood: Secondary | ICD-10-CM

## 2019-09-29 NOTE — Progress Notes (Signed)
      [facility] Counselor/Therapist Progress Note  Patient ID: Johnny [name], MRN: [id number],    Date: 09/29/2019  Time Spent: 50 minutes   Treatment Type: Individual Therapy  Reported Symptoms: anxious, sad  Mental Status Exam:  Appearance:   Casual     Behavior:  Appropriate  Motor:  Normal  Speech/Language:   Clear and Coherent  Affect:  Appropriate  Mood:  anxious and sad  Thought process:  normal  Thought content:    WNL  Sensory/Perceptual disturbances:    WNL  Orientation:  oriented to person, place, time/date and situation  Attention:  Good  Concentration:  Good  Memory:  WNL  Fund of knowledge:   Good  Insight:    Good  Judgment:   Good  Impulse Control:  Good   Risk Assessment: Danger to Self:  No Self-injurious Behavior: No Danger to Others: No Duty to Warn:no Physical Aggression / Violence:No  Access to Firearms a concern: No  Gang Involvement:No   Subjective: The client is sad and anxious today over the break-up with his current girlfriend.  He states that he thinks that she might be borderline personality disorder.  He read a string of texts that they had exchanged back and forth.  He wanted to know if he was reading them correctly.  As the client read through the texts I pointed out that his girlfriend would make assumptions about something that did not happen and then attribute a negative motivation to it.  She complained about them not getting together.  She had started a job in [place] which was second shift.  This made coordinating their schedules more difficult.  In the string of texts the client clearly made efforts and overtures to schedule time together.  In each circumstance there was no response.  Then the girlfriend would go on to claim injury that they were not getting together. I went over the thinking errors once again with the client and how important it was to get these clarified in their communication.  If they continued no relationship  would survive.  The client is now unsure if he wants to pursue the relationship.  He feels his girlfriend Ambrosio Reuter already be seeing someone else.  She mentioned to him that she had friends with benefits because "sex was just sex".  This is not the client's orientation.  He will evaluate our discussion and talk to his girlfriend.     Interventions: Assertiveness/Communication, Motivational Interviewing, Solution-Oriented/Positive Psychology and Insight-Oriented  Diagnosis:   ICD-10-CM   1. Adjustment disorder with mixed anxiety and depressed mood  F43.23     Plan: Mood independent behavior, assertiveness, boundaries, review thinking errors, self-care.  Leyton Brownlee, [facility]

## 2019-10-13 ENCOUNTER — Other Ambulatory Visit: Payer: Self-pay

## 2019-10-13 ENCOUNTER — Encounter: Payer: Self-pay | Admitting: Psychiatry

## 2019-10-13 ENCOUNTER — Ambulatory Visit (INDEPENDENT_AMBULATORY_CARE_PROVIDER_SITE_OTHER): Payer: BC Managed Care – PPO | Admitting: Psychiatry

## 2019-10-13 DIAGNOSIS — F4323 Adjustment disorder with mixed anxiety and depressed mood: Secondary | ICD-10-CM | POA: Diagnosis not present

## 2019-10-13 NOTE — Progress Notes (Signed)
      [facility] Counselor/Therapist Progress Note  Patient ID: Johnny [name], MRN: [id number],    Date: 10/13/2019  Time Spent: 50 minutes   Treatment Type: Individual Therapy  Reported Symptoms: sadness, anxiety  Mental Status Exam:  Appearance:   Casual     Behavior:  Appropriate  Motor:  Normal  Speech/Language:   Clear and Coherent  Affect:  Appropriate  Mood:  anxious and sad  Thought process:  normal  Thought content:    WNL  Sensory/Perceptual disturbances:    WNL  Orientation:  oriented to person, place, time/date and situation  Attention:  Good  Concentration:  Good  Memory:  WNL  Fund of knowledge:   Good  Insight:    Good  Judgment:   Good  Impulse Control:  Good   Risk Assessment: Danger to Self:  No Self-injurious Behavior: No Danger to Others: No Duty to Warn:no Physical Aggression / Violence:No  Access to Firearms a concern: No  Gang Involvement:No   Subjective: The client sold his house for $20,000 above the asking price. This is been a huge relief for him. Currently the biggest anxiety has been finding a new place to live. The market has a low inventory with multiple offers on houses that come on the market. The client has studied more about borderline personality disorder which he believes his girlfriend has. She continues to construct a narrative that he is the bad guy. He notes that she goes from idealization of him to devaluation. "It happened so quickly." Today I used eye-movement with the client focusing on his girlfriend. His negative cognition is, "I'm just so sad." He feels the sadness in his chest. His subjective units of distress is an 8. As the client processed he was confused at her behavior. She has expectations that he is unaware of and when they're not met she becomes angry with him. No matter what he does it always turns out to be the wrong thing. She makes generalizations about his behavior and interprets the negative agenda. She reads in  to what he says far beyond what it really means. The narrative that she creates paints him as someone who is uninterested and uncaring. This makes the client very sad. As we continued to process the client realized that this relationship would never work but only become more torturous. We discussed ending the relationship. The client wants to exit gently and kindly. We discussed the nature of the text that he might send. I asked the client to sit on that for a few days before he sends that to make sure it is exactly what he wants. He agreed. His subjective units of distress was less than 5 at the end of the session. He wants to study borderline personality disorder from the DSM-V perspective.  Interventions: Assertiveness/Communication, Mindfulness Meditation, Motivational Interviewing, Solution-Oriented/Positive Psychology, [employer] Desensitization and Reprocessing (EMDR) and Insight-Oriented  Diagnosis:   ICD-10-CM   1. Adjustment disorder with mixed anxiety and depressed mood  F43.23     Plan: Mindful prayer, mood independent behavior, constructive final text, self-care, social network, positive self talk, boundaries, assertiveness, review DSM-V criteria for borderline personality disorder.  Ananiah Maciolek, [facility]

## 2019-10-18 ENCOUNTER — Other Ambulatory Visit: Payer: Self-pay | Admitting: Psychiatry

## 2019-10-19 ENCOUNTER — Other Ambulatory Visit: Payer: Self-pay | Admitting: Psychiatry

## 2019-10-20 ENCOUNTER — Other Ambulatory Visit: Payer: Self-pay

## 2019-10-20 ENCOUNTER — Encounter: Payer: Self-pay | Admitting: Dermatology

## 2019-10-20 ENCOUNTER — Ambulatory Visit (INDEPENDENT_AMBULATORY_CARE_PROVIDER_SITE_OTHER): Payer: BC Managed Care – PPO | Admitting: Dermatology

## 2019-10-20 DIAGNOSIS — D0462 Carcinoma in situ of skin of left upper limb, including shoulder: Secondary | ICD-10-CM

## 2019-10-20 NOTE — Patient Instructions (Signed)

## 2019-10-23 ENCOUNTER — Encounter: Payer: Self-pay | Admitting: Dermatology

## 2019-10-23 NOTE — Progress Notes (Addendum)
   Follow-Up Visit   Subjective  Johnny [name] is a 58 y.o. male who presents for the following: Procedure (Here for treatment of CIS on left shoulder.).  CIS Location: Left shoulder Duration:  Quality:  Associated Signs/Symptoms: Modifying Factors:  Severity:  Timing: Context: Treatment  The following portions of the chart were reviewed this encounter and updated as appropriate: Tobacco  Allergies  Meds  Problems  Med Hx  Surg Hx  Fam Hx      Objective  Well appearing patient in no apparent distress; mood and affect are within normal limits.  A focused examination was performed including torso. Relevant physical exam findings are noted in the Assessment and Plan.   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  Destruction of lesion Complexity: simple   Destruction method: electrodesiccation and curettage   Informed consent: discussed and consent obtained   Timeout:  patient name, date of birth, surgical site, and procedure verified Anesthesia: the lesion was anesthetized in a standard fashion   Anesthetic:  1% lidocaine w/ epinephrine 1-100,000 local infiltration Curettage performed in three different directions: Yes   Curettage cycles:  3 Lesion length (cm):  2 Lesion width (cm):  1 Margin per side (cm):  0 Final wound size (cm):  2 Hemostasis achieved with:  ferric subsulfate Outcome: patient tolerated procedure well with no complications   Post-procedure details: wound care instructions given   Additional details:  Inoculated with parenteral 5% fluorouracil  Infiltrative lidocaine-epinephrine anesthetic, curet x3, wide>deep, base inoculated with parenteral 5FU, 2.0cm.

## 2019-10-25 ENCOUNTER — Other Ambulatory Visit: Payer: Self-pay | Admitting: Psychiatry

## 2019-10-25 NOTE — Telephone Encounter (Signed)
Next apt 05/13, not sure how often he should be getting refills?

## 2019-10-27 ENCOUNTER — Encounter: Payer: Self-pay | Admitting: Psychiatry

## 2019-10-27 ENCOUNTER — Ambulatory Visit (INDEPENDENT_AMBULATORY_CARE_PROVIDER_SITE_OTHER): Payer: BC Managed Care – PPO | Admitting: Psychiatry

## 2019-10-27 ENCOUNTER — Other Ambulatory Visit: Payer: Self-pay

## 2019-10-27 DIAGNOSIS — F4323 Adjustment disorder with mixed anxiety and depressed mood: Secondary | ICD-10-CM | POA: Diagnosis not present

## 2019-10-27 NOTE — Progress Notes (Signed)
      [facility] Counselor/Therapist Progress Note  Patient ID: Johnny [name], MRN: [id number],    Date: 10/27/2019  Time Spent: 50 minutes   Treatment Type: Individual Therapy  Reported Symptoms: anxious, sad  Mental Status Exam:  Appearance:   Casual     Behavior:  Appropriate  Motor:  Normal  Speech/Language:   Clear and Coherent  Affect:  Appropriate  Mood:  anxious and sad  Thought process:  normal  Thought content:    WNL  Sensory/Perceptual disturbances:    WNL  Orientation:  oriented to person, place, time/date and situation  Attention:  Good  Concentration:  Good  Memory:  WNL  Fund of knowledge:   Good  Insight:    Good  Judgment:   Good  Impulse Control:  Good   Risk Assessment: Danger to Self:  No Self-injurious Behavior: No Danger to Others: No Duty to Warn:no Physical Aggression / Violence:No  Access to Firearms a concern: No  Gang Involvement:No   Subjective: The client has been able to get a house under contract that meets all of his needs.  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  This is been a very positive move. The client continues to try to work things out with his girlfriend.  He did review the DSM-V on borderline personality disorder.  He states that she meets 5 out of the 7 criteria.  There is also crossover with narcissistic personality disorder.  He states, "she needs to see that on getting past my marriage."  I question the client about this.  He states because they had decided to go to a hotel for Valentines day to celebrate which was also at the same time as his birthday.  This was the first time the client had celebrated Valentines in 30 years with someone else.  He became overwhelmed with the realization of what the previous 30 years had been like.  His girlfriend took it as a rejection of her.  She believes he is not over his ex-wife.  The client and his ex-wife have an amicable relationship but there  is no romantic intent on either of their parts.  I discussed for the client at length to be able to couch it correctly with his girlfriend.  They would both have to stay out of mind-reading and interpretation as well as not using emotional reasoning and making decisions.  They would need to extend forgiveness and grace to each other.  The client is on board with this.  As the client has shared his interactions with his girlfriend I am skeptical that she has the resources to be able to make the shifts that he would like.  Interventions: Assertiveness/Communication, Mindfulness Meditation, Motivational Interviewing, Solution-Oriented/Positive Psychology and Insight-Oriented  Diagnosis:   ICD-10-CM   1. Adjustment disorder with mixed anxiety and depressed mood  F43.23     Plan: Assertiveness, boundaries, radical acceptance, mood independent behavior, avoid thinking errors  Johnny [name], [facility]

## 2019-10-30 ENCOUNTER — Other Ambulatory Visit: Payer: Self-pay | Admitting: Psychiatry

## 2019-11-10 ENCOUNTER — Other Ambulatory Visit: Payer: Self-pay

## 2019-11-10 ENCOUNTER — Encounter: Payer: Self-pay | Admitting: Psychiatry

## 2019-11-10 ENCOUNTER — Ambulatory Visit (INDEPENDENT_AMBULATORY_CARE_PROVIDER_SITE_OTHER): Payer: BC Managed Care – PPO | Admitting: Psychiatry

## 2019-11-10 DIAGNOSIS — F4323 Adjustment disorder with mixed anxiety and depressed mood: Secondary | ICD-10-CM | POA: Diagnosis not present

## 2019-11-10 NOTE — Progress Notes (Signed)
      [facility] Counselor/Therapist Progress Note  Patient ID: Johnny [name], MRN: [id number],    Date: 11/10/2019  Time Spent: 50 minutes   Treatment Type: Individual Therapy  Reported Symptoms: anxiety, sadness  Mental Status Exam:  Appearance:   Casual     Behavior:  Appropriate  Motor:  Normal  Speech/Language:   Clear and Coherent  Affect:  Appropriate  Mood:  anxious and sad  Thought process:  normal  Thought content:    WNL  Sensory/Perceptual disturbances:    WNL  Orientation:  oriented to person, place, time/date and situation  Attention:  Good  Concentration:  Good  Memory:  WNL  Fund of knowledge:   Good  Insight:    Good  Judgment:   Good  Impulse Control:  Good   Risk Assessment: Danger to Self:  No Self-injurious Behavior: No Danger to Others: No Duty to Warn:no Physical Aggression / Violence:No  Access to Firearms a concern: No  Gang Involvement:No   Subjective: The client is stressed because he is closing on his new house tomorrow and selling his old house.  He also is feeling burnt out with the care of his mom.  He states she had called him 8 times yesterday confused and disoriented.  "I am going to have to figure something out after I get moved." Today we focused on the client's ex-girlfriend and the end of their relationship.  He had brought in a piece of paper with the concerns that she had for him.  She is convinced that he is still enamored with his ex-wife.  She demands more communication but refuses to hear what he says.  She insists that there are issues that he has to deal with but is vague about what they are specifically.  I spoke with the client at length that with the borderline personality disorder one of the main things that they are sensitized to is any hint of rejection or abandonment.  She has interpreted his behavior as interest in his ex-wife.  The client discussed and realized that she would never accept what he had to say.  The client  agrees that he needs to end it.  I pointed out that she would contact him again and this would continue to draw out.  I suggested that he might want to consider blocking her on his phone and his email account.  The client will consider that.  Interventions: Assertiveness/Communication, Mindfulness Meditation, Motivational Interviewing, Solution-Oriented/Positive Psychology and Insight-Oriented  Diagnosis:   ICD-10-CM   1. Adjustment disorder with mixed anxiety and depressed mood  F43.23     Plan: Mood independent behavior, block communication, positive self talk, assertiveness, boundaries, self-care.  Avnoor Koury, [facility]

## 2019-11-17 ENCOUNTER — Ambulatory Visit (INDEPENDENT_AMBULATORY_CARE_PROVIDER_SITE_OTHER): Payer: BC Managed Care – PPO | Admitting: Psychiatry

## 2019-11-17 ENCOUNTER — Encounter: Payer: Self-pay | Admitting: Psychiatry

## 2019-11-17 ENCOUNTER — Other Ambulatory Visit: Payer: Self-pay

## 2019-11-17 DIAGNOSIS — G4710 Hypersomnia, unspecified: Secondary | ICD-10-CM

## 2019-11-17 DIAGNOSIS — N521 Erectile dysfunction due to diseases classified elsewhere: Secondary | ICD-10-CM

## 2019-11-17 DIAGNOSIS — F5105 Insomnia due to other mental disorder: Secondary | ICD-10-CM

## 2019-11-17 DIAGNOSIS — G4730 Sleep apnea, unspecified: Secondary | ICD-10-CM

## 2019-11-17 DIAGNOSIS — F411 Generalized anxiety disorder: Secondary | ICD-10-CM | POA: Diagnosis not present

## 2019-11-17 DIAGNOSIS — F3162 Bipolar disorder, current episode mixed, moderate: Secondary | ICD-10-CM | POA: Diagnosis not present

## 2019-11-17 MED ORDER — LORAZEPAM 1 MG PO TABS: 1.0000 mg | ORAL_TABLET | Freq: Three times a day (TID) | ORAL | 2 refills | Status: DC | PRN
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
## 2019-11-24 ENCOUNTER — Encounter: Payer: Self-pay | Admitting: Psychiatry

## 2019-11-24 ENCOUNTER — Ambulatory Visit (INDEPENDENT_AMBULATORY_CARE_PROVIDER_SITE_OTHER): Payer: BC Managed Care – PPO | Admitting: Psychiatry

## 2019-11-24 ENCOUNTER — Other Ambulatory Visit: Payer: Self-pay

## 2019-11-24 DIAGNOSIS — F4323 Adjustment disorder with mixed anxiety and depressed mood: Secondary | ICD-10-CM

## 2019-11-24 NOTE — Progress Notes (Signed)
      [facility] Counselor/Therapist Progress Note  Patient ID: Johnny [name], MRN: [id number],    Date: 11/24/2019  Time Spent: 50 minutes   Treatment Type: Individual Therapy  Reported Symptoms: anxious, sad  Mental Status Exam:  Appearance:   Casual     Behavior:  Appropriate  Motor:  Normal  Speech/Language:   Clear and Coherent  Affect:  Tearful  Mood:  anxious and sad  Thought process:  normal  Thought content:    WNL  Sensory/Perceptual disturbances:    WNL  Orientation:  oriented to person, place, time/date and situation  Attention:  Good  Concentration:  Good  Memory:  WNL  Fund of knowledge:   Good  Insight:    Good  Judgment:   Good  Impulse Control:  Good   Risk Assessment: Danger to Self:  No Self-injurious Behavior: No Danger to Others: No Duty to Warn:no Physical Aggression / Violence:No  Access to Firearms a concern: No  Gang Involvement:No   Subjective: The client has successfully moved into his house.  "It is lovely and quiet."  He is slowly moving the rest of his belongings out of his office into his house as well. Today the client wanted to focus on his recent break-up.  He is sad and mad that it did not work.  "I felt so hard in love.  I did not know she was mentally ill."  Today I used eye-movement focusing on the client's sadness and anxiety related to the break-up of this relationship.  His negative cognition is, "I do not understand."  He feels sadness and anxiety in his chest.  His subjective units of distress was a 6.  As the client processed he described how they would be together and she would suddenly take "a hard left turn".  He described her weaving together disperate threads of things that she knew to make whole cloth that would prove the client was going to abandon her.  The client had a hard time wrapping his head around this.  As he continued to process, I reminded him of the description of borderline personality disorder that he read in  the DSM-V.  We went back over some of the criteria which the client remembered.  "There was no way it was going to work."  He realized he was less mad at her and more mad at the personality disorder. The client has followed up with some of his friends from the divorce care group.  They have been supportive and helpful in navigating this break-up.  He Johnny [name] consider online dating but has decided to hold off for the moment.  As the client calmed, his subjective units of distress was less than 3.  His positive cognition was, "I am capable of moving forward."  Interventions: Assertiveness/Communication, Motivational Interviewing, Solution-Oriented/Positive Psychology, [employer] Group Desensitization and Reprocessing (EMDR) and Insight-Oriented  Diagnosis:   ICD-10-CM   1. Adjustment disorder with mixed anxiety and depressed mood  F43.23     Plan: Mood independent behavior, self-care, positive self talk, assertiveness, boundaries, social network.  Neal Trulson, [facility]

## 2019-12-03 ENCOUNTER — Encounter: Payer: Self-pay | Admitting: Psychiatry

## 2019-12-08 ENCOUNTER — Encounter: Payer: Self-pay | Admitting: Psychiatry

## 2019-12-08 ENCOUNTER — Ambulatory Visit (INDEPENDENT_AMBULATORY_CARE_PROVIDER_SITE_OTHER): Payer: BC Managed Care – PPO | Admitting: Psychiatry

## 2019-12-08 ENCOUNTER — Other Ambulatory Visit: Payer: Self-pay

## 2019-12-08 DIAGNOSIS — F4323 Adjustment disorder with mixed anxiety and depressed mood: Secondary | ICD-10-CM | POA: Diagnosis not present

## 2019-12-08 NOTE — Progress Notes (Signed)
°      [facility] Counselor/Therapist Progress Note  Patient ID: Johnny [name], MRN: [id number],    Date: 12/08/2019  Time Spent: 50 minutes   Treatment Type: Individual Therapy  Reported Symptoms: sad, anxious  Mental Status Exam:  Appearance:   Casual     Behavior:  Appropriate  Motor:  Normal  Speech/Language:   Clear and Coherent  Affect:  Appropriate  Mood:  anxious and sad  Thought process:  normal  Thought content:    WNL  Sensory/Perceptual disturbances:    WNL  Orientation:  oriented to person, place, time/date and situation  Attention:  Good  Concentration:  Good  Memory:  WNL  Fund of knowledge:   Good  Insight:    Good  Judgment:   Good  Impulse Control:  Good   Risk Assessment: Danger to Self:  No Self-injurious Behavior: No Danger to Others: No Duty to Warn:no Physical Aggression / Violence:No  Access to Firearms a concern: No  Gang Involvement:No   Subjective: Client states that his mother had another stroke at her apartment and then fell and hit her head.  She is in placement at a rehab currently.  This has stressed the client out significantly. Today the client is also sad and anxious about his ex-girlfriend.  There was a card game that she had brought that had questions for couples in relationship.  The client went through some of them during the session.  Interestingly, the question seemed to be designed to generate insecurity and uncertainty in the relationship.  When the client could not answer the questions to his girlfriends satisfaction it was because he did not love her.  This was a huge struggle for the client.  I pointed out that none of the questions that he reviewed did anything to help or improve a relationship. I use the bilateral stimulation hand paddles with the client to help him visualize his girlfriend stepping onto a raft in a river while he stayed behind.  She floated on down the river.  He realized that she had her own journey that did  not include him.  "She never even looked back."  The client gained some measure of peace from this.  I encouraged the client to think about the future.  I asked him to focus on what he has learned and how this will benefit his next relationship.  He agreed.  Interventions: Assertiveness/Communication, Mindfulness Meditation, Motivational Interviewing, Solution-Oriented/Positive Psychology, [employer] Group Desensitization and Reprocessing (EMDR) and Insight-Oriented  Diagnosis:   ICD-10-CM   1. Adjustment disorder with mixed anxiety and depressed mood  F43.23     Plan: Mood independent behavior, radical acceptance, self-care, positive self talk, boundaries, assertiveness.  Alzada Brazee, [facility]

## 2019-12-12 ENCOUNTER — Other Ambulatory Visit: Payer: Self-pay | Admitting: Psychiatry

## 2020-01-03 ENCOUNTER — Other Ambulatory Visit: Payer: Self-pay

## 2020-01-03 ENCOUNTER — Ambulatory Visit (INDEPENDENT_AMBULATORY_CARE_PROVIDER_SITE_OTHER): Payer: BC Managed Care – PPO | Admitting: Psychiatry

## 2020-01-03 ENCOUNTER — Encounter: Payer: Self-pay | Admitting: Psychiatry

## 2020-01-03 DIAGNOSIS — F4321 Adjustment disorder with depressed mood: Secondary | ICD-10-CM

## 2020-01-03 NOTE — Progress Notes (Signed)
      [facility] Counselor/Therapist Progress Note  Patient ID: Johnny [name], MRN: [id number],    Date: 01/03/2020  Time Spent: 50 minutes   Treatment Type: Individual Therapy  Reported Symptoms: sad, tearful  Mental Status Exam:  Appearance:   Casual     Behavior:  Appropriate  Motor:  Normal  Speech/Language:   Clear and Coherent  Affect:  Tearful  Mood:  depressed and sad  Thought process:  normal  Thought content:    WNL  Sensory/Perceptual disturbances:    WNL  Orientation:  oriented to person, place, time/date and situation  Attention:  Good  Concentration:  Good  Memory:  WNL  Fund of knowledge:   Good  Insight:    Good  Judgment:   Good  Impulse Control:  Good   Risk Assessment: Danger to Self:  No Self-injurious Behavior: No Danger to Others: No Duty to Warn:no Physical Aggression / Violence:No  Access to Firearms a concern: No  Gang Involvement:No   Subjective: Client states that he has been depressed a lot feeling flat and exhausted.  He continues to deal with his mother who calls him all hours of the day and night.  He also has had difficulty getting his Internet service working correctly at his house.  Because of that he has been unable to do his work, [employer].  This is been overwhelming for the client as well.  He has also focused a lot on the end of his relationship.  This seems to weigh the heaviest on him.  Today we used eye-movement focusing on his relationship with his ex-girlfriend.  His negative cognition is, "I am bereft."  He feels sad and depressed in his chest.  His subjective units of distress is a 9.  As the client processed he became very, very tearful.  He remembered all the great times that they had together.  When he goes places there are things that remind him of her.  I reminded the client that at the end of their relationship his girlfriend had created a narrative about him that was not consistent with reality.  We discussed this  in detail.  As the client continued to process he realized, "I cannot go back to the past.  I have to guard my heart."  I pointed out to the client that there were parts of the relationship that were especially good.  I stated that this could be possible in other relationships in the future without the toxic elements.  The client brightened at this.  As we continued to process his subjective units of distress was less than 3.  He felt relieved at the end of the session.  His positive cognition was, "I can manage this."  Interventions: Assertiveness/Communication, Motivational Interviewing, Solution-Oriented/Positive Psychology, Eye Movement Desensitization and Reprocessing (EMDR) and Insight-Oriented  Diagnosis:   ICD-10-CM   1. Adjustment disorder with depressed mood  F43.21     Plan: Radical acceptance, mood independent behavior, positive self talk, boundaries, assertiveness, self-care.  Gearold Wainer, [facility]

## 2020-01-18 ENCOUNTER — Other Ambulatory Visit: Payer: Self-pay

## 2020-01-18 ENCOUNTER — Other Ambulatory Visit: Payer: Self-pay | Admitting: Psychiatry

## 2020-01-18 ENCOUNTER — Encounter: Payer: Self-pay | Admitting: Psychiatry

## 2020-01-18 ENCOUNTER — Ambulatory Visit (INDEPENDENT_AMBULATORY_CARE_PROVIDER_SITE_OTHER): Payer: BC Managed Care – PPO | Admitting: Psychiatry

## 2020-01-18 DIAGNOSIS — F4321 Adjustment disorder with depressed mood: Secondary | ICD-10-CM | POA: Diagnosis not present

## 2020-01-18 NOTE — Progress Notes (Signed)
[facility] Counselor/Therapist Progress Note  Patient ID: Johnny [name], MRN: [id number],    Date: 01/18/2020  Time Spent: 50 minutes   Treatment Type: Individual Therapy  Reported Symptoms: sad  Mental Status Exam:  Appearance:   Casual     Behavior:  Appropriate  Motor:  Normal  Speech/Language:   Clear and Coherent  Affect:  Appropriate  Mood:  anxious  Thought process:  normal  Thought content:    WNL  Sensory/Perceptual disturbances:    WNL  Orientation:  oriented to person, place, time/date and situation  Attention:  Good  Concentration:  Good  Memory:  WNL  Fund of knowledge:   Good  Insight:    Good  Judgment:   Good  Impulse Control:  Good   Risk Assessment: Danger to Self:  No Self-injurious Behavior: No Danger to Others: No Duty to Warn:no Physical Aggression / Violence:No  Access to Firearms a concern: No  Gang Involvement:No   Subjective: The client states that he is still having bouts of sadness as he thinks about his break-up with his ex-girlfriend.  He keeps going over in his mind everything that happened.  He states that he was so in love with her that it has really wounded his heart.  We talked at length about borderline personality disorder.  The client states he was unaware of that before dating her.  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  I discussed with the client that as long as the girlfriend felt that she was the priority in all things, it went well.  As soon as she perceived something negative such as the client being depressed about his anniversary she assumed he was still in love with his ex-wife.  No conversation with the girlfriend could seem to change that.  From that point she created a narrative about him stating he was withholding love, did not love her, and was using her.  The client agrees all that was untrue.  I asked the client to consider trying to step back mentally from this relationship and looking at the  facts.  I also suggested that he write a goodbye letter to his girlfriend that he does not send.  He could shred it or burn it or tear it up, whatever works for him.  [name] consider this. The client has decided to get some nursing care for his mom.  They will come in at least once a week to help her get situated.  He states his mom calls him 10-12 times a day and sometimes in the middle of the night.  She seems to be disoriented and out of touch with what time it Johnny [name] be or what day it Johnny [name] be.  The client and his ex-wife who both care for her  states they are worn out.  I encouraged the client to consider other care that might be able to come in daily for his mom for 4 hours at a less expensive rate than the nursing service.  He will consider that.  I asked him to make sure that he was taking care of himself with exercise, good sleep hygiene and nutrition.  He states that he is working on that.  I also asked him to review his relationship and determine what the red flags were, what went wrong and what he needs in future relationships.  He agreed.  Used eye-movement with the client focusing on his sadness which started at a  subjective units of distress of 6 and ended at a subjective units of distress of less than 2.  Interventions: Assertiveness/Communication, Motivational Interviewing, Solution-Oriented/Positive Psychology, [employer] Group Desensitization and Reprocessing (EMDR) and Insight-Oriented  Diagnosis:   ICD-10-CM   1. Adjustment disorder with depressed mood  F43.21     Plan: Goodbye letter to ex-girlfriend, other care for his mother, self-care, exercise, positive self talk, assertiveness, boundaries.  Johnny [name], [facility]

## 2020-01-24 ENCOUNTER — Encounter: Payer: Self-pay | Admitting: Psychiatry

## 2020-01-24 ENCOUNTER — Other Ambulatory Visit: Payer: Self-pay

## 2020-01-24 ENCOUNTER — Ambulatory Visit (INDEPENDENT_AMBULATORY_CARE_PROVIDER_SITE_OTHER): Payer: BC Managed Care – PPO | Admitting: Psychiatry

## 2020-01-24 DIAGNOSIS — F5105 Insomnia due to other mental disorder: Secondary | ICD-10-CM | POA: Diagnosis not present

## 2020-01-24 DIAGNOSIS — F3162 Bipolar disorder, current episode mixed, moderate: Secondary | ICD-10-CM | POA: Diagnosis not present

## 2020-01-24 DIAGNOSIS — F411 Generalized anxiety disorder: Secondary | ICD-10-CM | POA: Diagnosis not present

## 2020-01-24 DIAGNOSIS — G4710 Hypersomnia, unspecified: Secondary | ICD-10-CM

## 2020-01-24 DIAGNOSIS — G4730 Sleep apnea, unspecified: Secondary | ICD-10-CM

## 2020-01-24 MED ORDER — LORAZEPAM 1 MG PO TABS: 1.0000 mg | ORAL_TABLET | Freq: Three times a day (TID) | ORAL | 2 refills | Status: DC | PRN

## 2020-01-24 MED ORDER — TRAZODONE HCL 100 MG PO TABS: 100.0000 mg | ORAL_TABLET | Freq: Every day | ORAL | 0 refills | Status: DC

## 2020-01-24 MED ORDER — LAMOTRIGINE 100 MG PO TABS: ORAL_TABLET | ORAL | 5 refills | Status: DC

## 2020-01-24 MED ORDER — QUETIAPINE FUMARATE 400 MG PO TABS: 800.0000 mg | ORAL_TABLET | Freq: Every day | ORAL | 5 refills | Status: DC

## 2020-01-24 MED ORDER — OXCARBAZEPINE 300 MG PO TABS: ORAL_TABLET | ORAL | 5 refills | Status: DC

## 2020-01-24 MED ORDER — CLONIDINE HCL 0.1 MG PO TABS: ORAL_TABLET | ORAL | 5 refills | Status: DC
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
## 2020-02-01 ENCOUNTER — Other Ambulatory Visit: Payer: Self-pay

## 2020-02-01 ENCOUNTER — Ambulatory Visit (INDEPENDENT_AMBULATORY_CARE_PROVIDER_SITE_OTHER): Payer: BC Managed Care – PPO | Admitting: Psychiatry

## 2020-02-01 DIAGNOSIS — F411 Generalized anxiety disorder: Secondary | ICD-10-CM | POA: Diagnosis not present

## 2020-02-01 NOTE — Progress Notes (Signed)
      [facility] Counselor/Therapist Progress Note  Patient ID: BREXTON SOFIA, MRN: [id number],    Date: 02/02/2020  Time Spent: 50 minutes   Treatment Type: Individual Therapy  Reported Symptoms: anxiety  Mental Status Exam:  Appearance:   Casual     Behavior:  Appropriate  Motor:  Normal  Speech/Language:   Clear and Coherent  Affect:  Appropriate  Mood:  anxious  Thought process:  normal  Thought content:    WNL  Sensory/Perceptual disturbances:    WNL  Orientation:  oriented to person, place, time/date and situation  Attention:  Good  Concentration:  Good  Memory:  WNL  Fund of knowledge:   Good  Insight:    Good  Judgment:   Good  Impulse Control:  Good   Risk Assessment: Danger to Self:  No Self-injurious Behavior: No Danger to Others: No Duty to Warn:no Physical Aggression / Violence:No  Access to Firearms a concern: No  Gang Involvement:No   Subjective: The client states that today he has anxiety mostly connected to his mother.  I used eye-movement with the client focusing on this circumstance.  His negative cognition is, "I am overwhelmed."  He feels anxiety in his chest.  His subjective units of distress is an 8.  He states his mother's condition continues to deteriorate.  He has a nursing service coming in once a week.  His mother is calling him 6 or 7 times a day.  He feels her behavior is manipulative and controlling.  His ex-wife is also involved in his mother's care.  She is also complaining that his mother is calling her all hours of the night.  I discussed with the client if he had any kind of schedule with his mom?  He stated that he did not.  I suggested that he set a regular scheduled time that he could carve out so that she would know when he was coming.  It would also help him in organizing his own work time.  The client agreed and thought this was a great idea.  I also discussed with the client if his mother needed a higher level of care?  He stated  that she probably did but there was not enough money to take care of that.  I suggested to the client that he check with [facility] to see what kind of services might be available for his mother.  He Quince Santana also be able to secure the services of a sitter to help with his mom.  He stated he would look into that.  As we continued to process his subjective units of distress was less than 2.  His positive cognition at the end of the session was, "I can do this."  Interventions: Assertiveness/Communication, Motivational Interviewing, Solution-Oriented/Positive Psychology, [facility] [employer] Desensitization and Reprocessing (EMDR) and Insight-Oriented  Diagnosis:   ICD-10-CM   1. Generalized anxiety disorder  F41.1     Plan: Schedule time with his mom, positive self talk, self-care, exercise, check with [facility], possibly secure the services of a sitter.  Constantino Starace, [facility]

## 2020-02-02 ENCOUNTER — Encounter: Payer: Self-pay | Admitting: Psychiatry

## 2020-02-15 ENCOUNTER — Encounter: Payer: Self-pay | Admitting: Psychiatry

## 2020-02-15 ENCOUNTER — Other Ambulatory Visit: Payer: Self-pay

## 2020-02-15 ENCOUNTER — Ambulatory Visit (INDEPENDENT_AMBULATORY_CARE_PROVIDER_SITE_OTHER): Payer: BC Managed Care – PPO | Admitting: Psychiatry

## 2020-02-15 DIAGNOSIS — F411 Generalized anxiety disorder: Secondary | ICD-10-CM

## 2020-02-15 NOTE — Progress Notes (Signed)
      [facility] Counselor/Therapist Progress Note  Patient ID: LAIRD RUNNION, MRN: [id number],    Date: 02/15/2020  Time Spent: 50 minutes   Treatment Type: Individual Therapy  Reported Symptoms: anxiety,stress  Mental Status Exam:  Appearance:   Casual     Behavior:  Appropriate  Motor:  Normal  Speech/Language:   Clear and Coherent  Affect:  Appropriate  Mood:  anxious  Thought process:  normal  Thought content:    WNL  Sensory/Perceptual disturbances:    WNL  Orientation:  oriented to person, place, time/date and situation  Attention:  Good  Concentration:  Good  Memory:  WNL  Fund of knowledge:   Good  Insight:    Good  Judgment:   Good  Impulse Control:  Good   Risk Assessment: Danger to Self:  No Self-injurious Behavior: No Danger to Others: No Duty to Warn:no Physical Aggression / Violence:No  Access to Firearms a concern: No  Gang Involvement:No   Subjective: The client states that he has stayed off of Facebook for the past 3 months.  This has helped him get past his ex-girlfriend.  He feels much relief with that.  The client states that his mom is sliding into dementia.  She continues to call all time of the day and night.  He has a nurse come in 1 day a week for 4 hours.  The client and his ex-wife try to cover the other times themselves.  The client is very concerned that she will lose her placement at the [facility].  The client states his mom has no other monies for a nursing care facility.  I suggested to the client that he get his mother into see her primary care physician.  If she is doing as poorly as he says her physician might refer her to hospice care.  The client will follow-up on this.  Ultimately I told the client he Deigo Alonso have to take his mother into his own home if she loses her placement.  The client balked at this idea stating it would be too overwhelming.  I encouraged the client to follow-up with his mother's primary care physician.  Interventions:  Assertiveness/Communication, Motivational Interviewing, Solution-Oriented/Positive Psychology, [employer] Desensitization and Reprocessing (EMDR) and Insight-Oriented  Diagnosis:   ICD-10-CM   1. Generalized anxiety disorder  F41.1     Plan: Follow-up with his mother's primary care physician, positive self talk, self-care, exercise, assertiveness, boundaries.  Dalisha Shively, [facility]

## 2020-02-29 ENCOUNTER — Encounter: Payer: Self-pay | Admitting: Psychiatry

## 2020-02-29 ENCOUNTER — Ambulatory Visit (INDEPENDENT_AMBULATORY_CARE_PROVIDER_SITE_OTHER): Payer: BC Managed Care – PPO | Admitting: Psychiatry

## 2020-02-29 ENCOUNTER — Other Ambulatory Visit: Payer: Self-pay

## 2020-02-29 DIAGNOSIS — F411 Generalized anxiety disorder: Secondary | ICD-10-CM

## 2020-02-29 NOTE — Progress Notes (Signed)
      [facility] Counselor/Therapist Progress Note  Patient ID: Johnny [name], MRN: [id number],    Date: 02/29/2020  Time Spent: 50 minutes   Treatment Type: Individual Therapy  Reported Symptoms: anxiety  Mental Status Exam:  Appearance:   Casual     Behavior:  Appropriate  Motor:  Normal  Speech/Language:   Clear and Coherent  Affect:  Appropriate  Mood:  anxious  Thought process:  normal  Thought content:    WNL  Sensory/Perceptual disturbances:    WNL  Orientation:  oriented to person, place, time/date and situation  Attention:  Good  Concentration:  Good  Memory:  WNL  Fund of knowledge:   Good  Insight:    Good  Judgment:   Good  Impulse Control:  Good   Risk Assessment: Danger to Self:  No Self-injurious Behavior: No Danger to Others: No Duty to Warn:no Physical Aggression / Violence:No  Access to Firearms a concern: No  Gang Involvement:No   Subjective: [name] has recently moved in to his house. The care for his disabled son had fallen apart so the client has taken it upon himself for his son to live with him. He states that things have been going well. He has set up a structure for his son with chores and is scheduled for the day. His son comes over to his mother's house with the client and they both spent time with her. The client has been pleased with how things have gone with his son. The client has high anxiety with his mom. He has been able to find a person to come in 3 days a week for a few hours to cook and do some basic care for his mother. He also has a nurse coming in for 4 hours 1 day a week and then the client and his ex-wife pick up the other days. He is hopeful that this will alleviate some of the issues that his mom is having. She seems to go in and out of lucidity according to the client. He gets frustrated with her care and her lack of understanding. According to the doctor she is continuing to decline. The client feels that he is prepared  for his mother's passing. He is trying to take 1 day at a time. Today the client use the bilateral stimulation hand paddles as he discussed this. His subjective units of distress went from a 6 to less than 1. His positive cognition at the end of the session was, "I'm doing the best I can."  Interventions: Assertiveness/Communication, Motivational Interviewing, Solution-Oriented/Positive Psychology and Insight-Oriented  Diagnosis:   ICD-10-CM   1. Generalized anxiety disorder  F41.1     Plan: Mood independent behavior, positive self talk, self-care, exercise, schedule for son, care for his mother, assertiveness, boundaries.  Johnny [name], [facility]

## 2020-03-09 ENCOUNTER — Other Ambulatory Visit: Payer: Self-pay | Admitting: Psychiatry

## 2020-03-09 DIAGNOSIS — G4710 Hypersomnia, unspecified: Secondary | ICD-10-CM

## 2020-03-09 DIAGNOSIS — F5105 Insomnia due to other mental disorder: Secondary | ICD-10-CM

## 2020-03-09 DIAGNOSIS — F411 Generalized anxiety disorder: Secondary | ICD-10-CM

## 2020-03-09 DIAGNOSIS — F3162 Bipolar disorder, current episode mixed, moderate: Secondary | ICD-10-CM

## 2020-03-09 DIAGNOSIS — G4730 Sleep apnea, unspecified: Secondary | ICD-10-CM

## 2020-03-09 DIAGNOSIS — N521 Erectile dysfunction due to diseases classified elsewhere: Secondary | ICD-10-CM

## 2020-03-12 ENCOUNTER — Other Ambulatory Visit: Payer: Self-pay | Admitting: Psychiatry

## 2020-03-12 DIAGNOSIS — F5105 Insomnia due to other mental disorder: Secondary | ICD-10-CM

## 2020-03-14 ENCOUNTER — Encounter: Payer: Self-pay | Admitting: Psychiatry

## 2020-03-14 ENCOUNTER — Other Ambulatory Visit: Payer: Self-pay

## 2020-03-14 ENCOUNTER — Ambulatory Visit (INDEPENDENT_AMBULATORY_CARE_PROVIDER_SITE_OTHER): Payer: BC Managed Care – PPO | Admitting: Psychiatry

## 2020-03-14 DIAGNOSIS — F411 Generalized anxiety disorder: Secondary | ICD-10-CM

## 2020-03-14 NOTE — Progress Notes (Signed)
[facility] Counselor/Therapist Progress Note  Patient ID: Johnny [name], MRN: [id number],    Date: 03/14/2020  Time Spent: 50 minutes   Treatment Type: Individual Therapy  Reported Symptoms: anxious, overwhelmed, irritation  Mental Status Exam:  Appearance:   Casual     Behavior:  Appropriate  Motor:  Normal  Speech/Language:   Clear and Coherent  Affect:  Appropriate  Mood:  anxious and irritable  Thought process:  normal  Thought content:    WNL  Sensory/Perceptual disturbances:    WNL  Orientation:  oriented to person, place, time/date and situation  Attention:  Good  Concentration:  Good  Memory:  WNL  Fund of knowledge:   Good  Insight:    Good  Judgment:   Good  Impulse Control:  Good   Risk Assessment: Danger to Self:  No Self-injurious Behavior: No Danger to Others: No Duty to Warn:no Physical Aggression / Violence:No  Access to Firearms a concern: No  Gang Involvement:No   Subjective: Client states that he is irritated, anxious and overwhelmed today.  Last night he and his son watched a movie which had a war theme.  [name] is special needs.  He apparently was influenced by the movie and ended up trying to shave his head in the bathroom.  The client had to take his clippers to clean up his sons attempted haircut.  He was very irritated that this occurred.  He is working hard at trying to keep him stable.  Today his son waits for the client in the lobby with a pair of camo pants on.  I suggested to the client that tonight they watch a movie that is  family oriented with positive moral lessons.  The client agreed that he would do this. Since the client's son is living with him he will have to go through training to be his alternative family living aid.  The client is a little annoyed that he has to do this since he is raised his son his whole life.  But it is the requirement of the department of social services.  The client is still willing to do  this. The client's mother continues to be problematic.  He does have more help for her each day.  There is at least someone coming each day for a few hours to help her.  She still calls 16-17 times a week in the middle of the night.  This causes the client's anxiety to be very high.  Today I used eye-movement with the client focusing on his mom.  His negative cognition is, "I am overwhelmed."  His subjective units of distress is a 6.  He feels it in his chest.  As the client processed, he noted that his mom tells him that she "wants to go home".  The client understands and feels compassion for her.  As he continued to process he realized he was doing the best that he could.  He has been amazed at how his ex-wife has stepped in to caring for his mother and scheduling all the help through the end of the month.  "Who is this woman?"  I pointed out to the client that this was a positive turn of events to which he agreed.  As he continued to process he came to a place where he just felt blank.  His subjective units of distress was a 0.  He stated, "I wish I could feel this all the time."  I used the bilateral stimulation hand paddles with the client to install a relaxation place for the client.  He visualized himself on a [place] and allowed himself to melt into the peace and relaxation.  His cue word was "tranquil".  The client was able to access it when we stopped.  I instructed him to use it as he needed through the week to help him take a break.  He agreed.  Interventions: Assertiveness/Communication, Mindfulness Meditation, Motivational Interviewing, Solution-Oriented/Positive Psychology, [employer] [facility] Desensitization and Reprocessing (EMDR) and Insight-Oriented  Diagnosis:   ICD-10-CM   1. Generalized anxiety disorder  F41.1     Plan: Using mindfulness for his relaxation place, positive self talk, self-care, exercise, assertiveness, boundaries.  Raynee Mccasland,  [facility]

## 2020-03-21 DIAGNOSIS — Z471 Aftercare following joint replacement surgery: Secondary | ICD-10-CM | POA: Diagnosis not present

## 2020-03-21 DIAGNOSIS — M1712 Unilateral primary osteoarthritis, left knee: Secondary | ICD-10-CM | POA: Diagnosis not present

## 2020-03-21 DIAGNOSIS — Z96651 Presence of right artificial knee joint: Secondary | ICD-10-CM | POA: Diagnosis not present

## 2020-03-27 ENCOUNTER — Encounter: Payer: Self-pay | Admitting: Psychiatry

## 2020-03-27 ENCOUNTER — Other Ambulatory Visit: Payer: Self-pay

## 2020-03-27 ENCOUNTER — Ambulatory Visit (INDEPENDENT_AMBULATORY_CARE_PROVIDER_SITE_OTHER): Payer: BC Managed Care – PPO | Admitting: Psychiatry

## 2020-03-27 DIAGNOSIS — F411 Generalized anxiety disorder: Secondary | ICD-10-CM

## 2020-03-27 NOTE — Progress Notes (Signed)
      [facility] Counselor/Therapist Progress Note  Patient ID: RODERT HINCH, MRN: [id number],    Date: 03/27/2020  Time Spent: 50 minutes   Treatment Type: Individual Therapy  Reported Symptoms: anxiety  Mental Status Exam:  Appearance:   Casual     Behavior:  Appropriate  Motor:  Normal  Speech/Language:   Clear and Coherent  Affect:  Appropriate  Mood:  anxious  Thought process:  normal  Thought content:    WNL  Sensory/Perceptual disturbances:    WNL  Orientation:  oriented to person, place, time/date and situation  Attention:  Good  Concentration:  Good  Memory:  WNL  Fund of knowledge:   Good  Insight:    Good  Judgment:   Good  Impulse Control:  Good   Risk Assessment: Danger to Self:  No Self-injurious Behavior: No Danger to Others: No Duty to Warn:no Physical Aggression / Violence:No  Access to Firearms a concern: No  Gang Involvement:No   Subjective: The client states that his son living with him has been positive.  He sees that his son's overall anxiety has decreased and he is much more independent.  The client Domini Vandehei be required to go through training to be his sons [name] (alternative family living).  This would also require that he give up his parental rights.  The client is upset and anxious over that.  I suggested that he talk to his lawyer about becoming his sons power of attorney and also getting his medical power of attorney as well.  That way even if he gave up his parental rights he would still have some say in the care of his son's finances and health.  The client thought this was a great idea and will talk to his lawyer. The client is also managing his mom's care better.  He has been able to use the safe place, the [place], to relax and reduce his overall stress and anxiety.  His mother has someone each day coming over to help her which has been very positive.  The client notes that he feels more anxious when he goes by to see her because there is so much  to do.  I encouraged the client to practice mood independent behavior as he does these things.  Also to use his safe/relaxation place to calm himself before he goes in.  He agreed.  Interventions: Assertiveness/Communication, Mindfulness Meditation, Motivational Interviewing, Solution-Oriented/Positive Psychology and Insight-Oriented  Diagnosis:   ICD-10-CM   1. Generalized anxiety disorder  F41.1     Plan: Use safe/relaxation place, mood independent behavior, positive self talk, self-care, assertiveness, boundaries.  Kriss Perleberg, [facility]

## 2020-04-02 ENCOUNTER — Ambulatory Visit: Payer: BC Managed Care – PPO | Admitting: Dermatology

## 2020-04-02 ENCOUNTER — Other Ambulatory Visit: Payer: Self-pay

## 2020-04-02 ENCOUNTER — Encounter: Payer: Self-pay | Admitting: Psychiatry

## 2020-04-02 ENCOUNTER — Ambulatory Visit (INDEPENDENT_AMBULATORY_CARE_PROVIDER_SITE_OTHER): Payer: BC Managed Care – PPO | Admitting: Psychiatry

## 2020-04-02 DIAGNOSIS — F411 Generalized anxiety disorder: Secondary | ICD-10-CM

## 2020-04-02 DIAGNOSIS — G4730 Sleep apnea, unspecified: Secondary | ICD-10-CM

## 2020-04-02 DIAGNOSIS — F5105 Insomnia due to other mental disorder: Secondary | ICD-10-CM | POA: Diagnosis not present

## 2020-04-02 DIAGNOSIS — F3162 Bipolar disorder, current episode mixed, moderate: Secondary | ICD-10-CM | POA: Diagnosis not present

## 2020-04-02 DIAGNOSIS — G4710 Hypersomnia, unspecified: Secondary | ICD-10-CM | POA: Diagnosis not present

## 2020-04-02 MED ORDER — LORAZEPAM 1 MG PO TABS: 1.0000 mg | ORAL_TABLET | Freq: Three times a day (TID) | ORAL | 2 refills | Status: DC | PRN

## 2020-04-02 MED ORDER — CLONIDINE HCL 0.1 MG PO TABS: ORAL_TABLET | ORAL | 1 refills | Status: DC

## 2020-04-02 MED ORDER — LAMOTRIGINE 100 MG PO TABS: ORAL_TABLET | ORAL | 1 refills | Status: DC

## 2020-04-02 MED ORDER — TRAZODONE HCL 100 MG PO TABS: 100.0000 mg | ORAL_TABLET | Freq: Every day | ORAL | 1 refills | Status: DC

## 2020-04-02 MED ORDER — QUETIAPINE FUMARATE 400 MG PO TABS: 800.0000 mg | ORAL_TABLET | Freq: Every day | ORAL | 1 refills | Status: DC
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
## 2020-04-07 DIAGNOSIS — M25562 Pain in left knee: Secondary | ICD-10-CM | POA: Diagnosis not present

## 2020-04-10 ENCOUNTER — Ambulatory Visit (INDEPENDENT_AMBULATORY_CARE_PROVIDER_SITE_OTHER): Payer: BC Managed Care – PPO | Admitting: Psychiatry

## 2020-04-10 ENCOUNTER — Encounter: Payer: Self-pay | Admitting: Psychiatry

## 2020-04-10 ENCOUNTER — Other Ambulatory Visit: Payer: Self-pay

## 2020-04-10 DIAGNOSIS — F411 Generalized anxiety disorder: Secondary | ICD-10-CM | POA: Diagnosis not present

## 2020-04-10 NOTE — Progress Notes (Signed)
      [facility] Counselor/Therapist Progress Note  Patient ID: Johnny [name], MRN: [id number],    Date: 04/10/2020  Time Spent: 50 minutes   Treatment Type: Individual Therapy  Reported Symptoms: anxiety  Mental Status Exam:  Appearance:   Casual     Behavior:  Appropriate  Motor:  Normal  Speech/Language:   Clear and Coherent  Affect:  Appropriate  Mood:  anxious  Thought process:  normal  Thought content:    WNL  Sensory/Perceptual disturbances:    WNL  Orientation:  oriented to person, place, time/date and situation  Attention:  Good  Concentration:  Good  Memory:  WNL  Fund of knowledge:   Good  Insight:    Good  Judgment:   Good  Impulse Control:  Good   Risk Assessment: Danger to Self:  No Self-injurious Behavior: No Danger to Others: No Duty to Warn:no Physical Aggression / Violence:No  Access to Firearms a concern: No  Gang Involvement:No   Subjective: The client states that his son has been doing very well at his house.  He states that his difficult day today because there is not a day program for his son to go to.  The client finds it very distracting to have his son around always watching TV or listening to music.  "His Alexa drives me crazy." The client has discussed with his primary care physician about his mother's dementia.  Dr. Nancy Fetter has agreed to make a referral to palliative care for his mother.  Today I used the bilateral stimulation hand paddles with the client focusing on the issue of his mom's dementia.  His negative cognition is, "it does not stop."  He feels anxiety in his chest is subjective units of distress is an 8.  As the client processed we discussed the fact that once Dr. Nancy Fetter makes the referral hopefully things will go fairly quickly.  I also pointed out that he has been doing the best that he could for his mother.  He agrees.  He just feels it is so stressful.  I agreed with the client that he was not over playing it.  The client did mention  that he is talking to a woman in [place], [place].  It is been a very positive interaction for him with all the things going on with his mom.  I suggested to the client that he focus on mindfully turning this over to the care of God.  He agreed.  Interventions: Assertiveness/Communication, Mindfulness Meditation, Motivational Interviewing, Solution-Oriented/Positive Psychology, [employer] Group Desensitization and Reprocessing (EMDR) and Insight-Oriented  Diagnosis:   ICD-10-CM   1. Generalized anxiety disorder  F41.1     Plan: Mindfully turn things over to the care of God, positive self talk, self-care, social network, radical acceptance, assertiveness, boundaries.  Johnny [name], [facility]

## 2020-04-17 ENCOUNTER — Ambulatory Visit: Payer: BC Managed Care – PPO | Admitting: Psychiatry

## 2020-04-27 DIAGNOSIS — M1712 Unilateral primary osteoarthritis, left knee: Secondary | ICD-10-CM | POA: Diagnosis not present

## 2020-05-01 ENCOUNTER — Ambulatory Visit: Payer: BC Managed Care – PPO | Admitting: Psychiatry

## 2020-06-07 ENCOUNTER — Other Ambulatory Visit: Payer: Self-pay

## 2020-06-07 ENCOUNTER — Ambulatory Visit (INDEPENDENT_AMBULATORY_CARE_PROVIDER_SITE_OTHER): Payer: BC Managed Care – PPO | Admitting: Psychiatry

## 2020-06-07 ENCOUNTER — Encounter: Payer: Self-pay | Admitting: Psychiatry

## 2020-06-07 DIAGNOSIS — F411 Generalized anxiety disorder: Secondary | ICD-10-CM | POA: Diagnosis not present

## 2020-06-07 NOTE — Progress Notes (Signed)
[facility] Counselor/Therapist Progress Note  Patient ID: CASS VANDERMEULEN, MRN: [id number],    Date: 06/07/2020  Time Spent: 50 minutes   Treatment Type: Individual Therapy  Reported Symptoms: anxiety  Mental Status Exam:  Appearance:   Casual     Behavior:  Appropriate  Motor:  Normal  Speech/Language:   Clear and Coherent  Affect:  Appropriate  Mood:  anxious  Thought process:  normal  Thought content:    WNL  Sensory/Perceptual disturbances:    WNL  Orientation:  oriented to person, place, time/date and situation  Attention:  Good  Concentration:  Good  Memory:  WNL  Fund of knowledge:   Good  Insight:    Good  Judgment:   Good  Impulse Control:  Good   Risk Assessment: Danger to Self:  No Self-injurious Behavior: No Danger to Others: No Duty to Warn:no Physical Aggression / Violence:No  Access to Firearms a concern: No  Gang Involvement:No   Subjective: "There is some days I get so overwhelmed I want to cry and hide."  The client states that he has no time for himself.  He has had his disabled son full-time since June.  They have been unable to find a daytime one-on-one for him.  The client's mother also has been very difficult to manage day by day.  He does have help coming in for her but is unsure if she can continue to live in an independent living situation.  This coming Tuesday the client is scheduled to have surgery on his left knee for ongoing problems with that.  He has no one to take him to the hospital and stay in the waiting room until the procedure is completed.  Today I used eye-movement with the client focusing on his son and mother.  His negative cognition is, "I cannot take it."  He feels overwhelmed in his chest.  His subjective units of distress is a 9+.  As the client processed he admitted that he was out of gas and had no more margin.  I asked him what he was doing to take care of himself?  "Not much really".  I suggested to the client that he get  back to his Epsom salts bath which he found very relaxing.  He has not done those in months.  Also some mild walking in his neighborhood.  His very good friend that he usually asks for help Threasa Kinch be unavailable because his wife just passed away.  The client states this person has always been there for him.  I encouraged the client to let his friend make the decision if he can help him or not and not to make it for him.  The client agreed that he would do this.  He also states that his ex-wife is sick with vertigo and has been out of work for a month.  The client has been buying groceries for her and washing her clothes.  I encouraged the client at the good work he was doing.  I asked him to increase his self-care.  He agreed.  His subjective units of distress at the end of the session was less than 4.  Interventions: Assertiveness/Communication, Motivational Interviewing, Solution-Oriented/Positive Psychology, [employer] Group Desensitization and Reprocessing (EMDR) and Insight-Oriented  Diagnosis:   ICD-10-CM   1. Generalized anxiety disorder  F41.1     Plan: Mood independent behavior, positive self talk, self-care, assertiveness, boundaries, radical acceptance.  Euriah Matlack, [facility]

## 2020-06-12 DIAGNOSIS — M23322 Other meniscus derangements, posterior horn of medial meniscus, left knee: Secondary | ICD-10-CM | POA: Diagnosis not present

## 2020-06-12 DIAGNOSIS — M23222 Derangement of posterior horn of medial meniscus due to old tear or injury, left knee: Secondary | ICD-10-CM | POA: Diagnosis not present

## 2020-06-12 DIAGNOSIS — M948X6 Other specified disorders of cartilage, lower leg: Secondary | ICD-10-CM | POA: Diagnosis not present

## 2020-06-12 DIAGNOSIS — G8918 Other acute postprocedural pain: Secondary | ICD-10-CM | POA: Diagnosis not present

## 2020-06-21 ENCOUNTER — Ambulatory Visit: Payer: BC Managed Care – PPO | Admitting: Psychiatry

## 2020-07-05 ENCOUNTER — Ambulatory Visit: Payer: BC Managed Care – PPO | Admitting: Psychiatry

## 2020-07-19 ENCOUNTER — Ambulatory Visit: Payer: BC Managed Care – PPO | Admitting: Psychiatry

## 2020-08-02 ENCOUNTER — Ambulatory Visit (INDEPENDENT_AMBULATORY_CARE_PROVIDER_SITE_OTHER): Payer: BC Managed Care – PPO | Admitting: Psychiatry

## 2020-08-02 ENCOUNTER — Encounter: Payer: Self-pay | Admitting: Psychiatry

## 2020-08-02 ENCOUNTER — Other Ambulatory Visit: Payer: Self-pay

## 2020-08-02 DIAGNOSIS — F411 Generalized anxiety disorder: Secondary | ICD-10-CM | POA: Diagnosis not present

## 2020-08-02 NOTE — Progress Notes (Signed)
      [facility] Counselor/Therapist Progress Note  Patient ID: MAUDE GLOOR, MRN: [id number],    Date: 08/02/2020  Time Spent: 50 minutes   Treatment Type: Individual Therapy  Reported Symptoms: anxiety  Mental Status Exam:  Appearance:   Casual     Behavior:  Appropriate  Motor:  Normal  Speech/Language:   Clear and Coherent  Affect:  Appropriate  Mood:  anxious  Thought process:  normal  Thought content:    WNL  Sensory/Perceptual disturbances:    WNL  Orientation:  oriented to person, place, time/date and situation  Attention:  Good  Concentration:  Good  Memory:  WNL  Fund of knowledge:   Good  Insight:    Good  Judgment:   Good  Impulse Control:  Good   Risk Assessment: Danger to Self:  No Self-injurious Behavior: No Danger to Others: No Duty to Warn:no Physical Aggression / Violence:No  Access to Firearms a concern: No  Gang Involvement:No   Subjective: Client states that things have been tough with his son at the house.  He finds that his son gets bored which stresses the client out.  He notes that his anxiety has been at a higher level.  He has been managing his mother's care and has finally set her up for palliative care.  His ex-wife which helps take care of his mother and periodically watches their son has had vertigo.  She is returning back to work part-time.  This worries the client because she has been helpful in taking some of the load off of him with their son.  Today I used the bilateral stimulation hand paddles with the client as he processed through his stress related to these issues.  He feels he has no time to himself.  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  He feels that he is starting to make some headway with his finances.  He finds that his time is divided between caring for his son and trying to do his work.  He is worried that when his ex-wife returns to work that he will run out of gas and doing the things that need to be  done.  I encouraged the client to try to stay in the present tense and take 1 day at a time.  No one can predict how the future [place] turn out.  He agreed. The client has also made a connection with a woman in the [place], [place] area.  They have had pleasant interactions and he hopes to meet her soon.  As he continued to process his subjective units of distress was less than 1 from a 4+.  Interventions: Assertiveness/Communication, Motivational Interviewing, Solution-Oriented/Positive Psychology, [employer] Group Desensitization and Reprocessing (EMDR) and Insight-Oriented  Diagnosis:   ICD-10-CM   1. Generalized anxiety disorder  F41.1     Plan: Self-care, positive self talk, assertiveness, boundaries, engaged activities, social network, assertiveness, boundaries.  Shivaan Tierno, [facility]

## 2020-08-05 ENCOUNTER — Other Ambulatory Visit: Payer: Self-pay | Admitting: Psychiatry

## 2020-08-05 DIAGNOSIS — F3162 Bipolar disorder, current episode mixed, moderate: Secondary | ICD-10-CM

## 2020-08-23 ENCOUNTER — Other Ambulatory Visit: Payer: Self-pay

## 2020-08-23 ENCOUNTER — Encounter: Payer: Self-pay | Admitting: Psychiatry

## 2020-08-23 ENCOUNTER — Ambulatory Visit (INDEPENDENT_AMBULATORY_CARE_PROVIDER_SITE_OTHER): Payer: BC Managed Care – PPO | Admitting: Psychiatry

## 2020-08-23 DIAGNOSIS — F411 Generalized anxiety disorder: Secondary | ICD-10-CM

## 2020-08-23 NOTE — Progress Notes (Signed)
[facility] Counselor/Therapist Progress Note  Patient ID: Johnny [name], MRN: [id number],    Date: 08/23/2020  Time Spent: 50 minutes   Treatment Type: Individual Therapy  Reported Symptoms: anxiety, stress  Mental Status Exam:  Appearance:   Casual     Behavior:  Appropriate  Motor:  Normal  Speech/Language:   Clear and Coherent  Affect:  Appropriate  Mood:  anxious  Thought process:  normal  Thought content:    WNL  Sensory/Perceptual disturbances:    WNL  Orientation:  oriented to person, place, time/date and situation  Attention:  Good  Concentration:  Good  Memory:  WNL  Fund of knowledge:   Good  Insight:    Good  Judgment:   Good  Impulse Control:  Good   Risk Assessment: Danger to Self:  No Self-injurious Behavior: No Danger to Others: No Duty to Warn:no Physical Aggression / Violence:No  Access to Firearms a concern: No  Gang Involvement:No   Subjective: "I am not getting a break."  The client states that he is very stressed and anxious.  He is trying to manage his son which, "wears me out".  For example the client finds that his son has not brushed his teeth in 3 days or has not washed his hair even after taking a shower.  The client has to step up to the plate and engage with his son which he finds very stressful. The client is also very overwhelmed in dealing with his mother.  "It is become a nightmare."  The client feels worn out and everything has become difficult.  It has been more difficult to schedule home care for his mother.  She is more and more out of it but can still call him all hours of the day and night on the phone.  The client also states he has been unable to procure a one-on-one for his son.  Today I used eye-movement focusing on these issues with the client.  His negative cognition is, "I am overwhelmed."  He feels anxiety and stress in his chest.  His subjective units of distress is a 7+.  As the client processed he described how  difficult things have become.  He finds himself easily irritated when he goes to the grocery store and people are not wearing their masks.  Finances have been more difficult for the client.  I asked the client how he has been doing spiritually?  He states that he has not been able to focus on his spiritual life with everything going on.  I pointed out to the client that in the past he would get up early to have his coffee and a quiet time with the [place].  I reminded him of the different things he would do such as going to the [facility] and sitting on a bench and meditating.  The client remembers this and states that now he has an outdoor patio he could do that there.  I also suggested that he use his garden tub with magnesium salts.  The client states that he just needs to get some grab bars but does not want to go to the hardware store.  I suggested to the client that he ordered them online.  He agreed to.  As we continued to process I reminded the client of the importance of his faith.  He calm down significantly in his subjective units of distress was 1.  His positive cognition at the end of the  session was, "I can trust God."  Interventions: Mindfulness Meditation, Motivational Interviewing, Solution-Oriented/Positive Psychology, [employer] Group Desensitization and Reprocessing (EMDR) and Insight-Oriented  Diagnosis:   ICD-10-CM   1. Generalized anxiety disorder  F41.1     Plan: Mindful prayer, mood independent behavior, positive self talk, self-care, boundaries, morning meditation.  Evolet Salminen, [facility]

## 2020-09-06 ENCOUNTER — Ambulatory Visit (INDEPENDENT_AMBULATORY_CARE_PROVIDER_SITE_OTHER): Payer: BC Managed Care – PPO | Admitting: Psychiatry

## 2020-09-06 ENCOUNTER — Encounter: Payer: Self-pay | Admitting: Psychiatry

## 2020-09-06 ENCOUNTER — Other Ambulatory Visit: Payer: Self-pay

## 2020-09-06 DIAGNOSIS — F411 Generalized anxiety disorder: Secondary | ICD-10-CM

## 2020-09-06 NOTE — Progress Notes (Signed)
      [facility] Counselor/Therapist Progress Note  Patient ID: Johnny [name], MRN: [id number],    Date: 09/06/2020  Time Spent: 50 minutes   Treatment Type: Individual Therapy  Reported Symptoms: anxiety  Mental Status Exam:  Appearance:   Casual     Behavior:  Appropriate  Motor:  Normal  Speech/Language:   Clear and Coherent  Affect:  Appropriate  Mood:  anxious  Thought process:  normal  Thought content:    WNL  Sensory/Perceptual disturbances:    WNL  Orientation:  oriented to person, place, time/date and situation  Attention:  Good  Concentration:  Good  Memory:  WNL  Fund of knowledge:   Good  Insight:    Good  Judgment:   Good  Impulse Control:  Good   Risk Assessment: Danger to Self:  No Self-injurious Behavior: No Danger to Others: No Duty to Warn:no Physical Aggression / Violence:No  Access to Firearms a concern: No  Gang Involvement:No   Subjective: The client states that he has had a hard week. He has very good friend of 66 years who died from a blastoma unexpectedly on Wednesday. A close friend of the family died of breast cancer this week as well. The client feels that this has all been very heavy for him and increased his anxiety. He continues to have issues caring for his mom. One of the caregivers that came in regularly has recently quit. 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 He describes himself as burnt out. Today I used the bilateral stimulation hand paddles as the client discussed some of these issues. His subjective units of distress was a 9. He feels the anxiety mostly in his chest. His negative cognition is, "I'm burnt out." As the client processed we switched back and forth from eye-movement to the bilateral stimulation hand paddles. I pointed out to the client that is much as he dislikes having to go to his mom's apartment and clean it he still does it. I suggested that it reflects well on his character. The  client states he still doesn't like it. I agreed but he does do it. The client grudgingly agreed. I then have the client visualized himself on the floor the beach. He used the bilateral stimulation hand paddles and was able to reduce his level of distress to a subjective units of distress of 2.  Interventions: Assertiveness/Communication, Motivational Interviewing, Solution-Oriented/Positive Psychology, [employer] Group Desensitization and Reprocessing (EMDR) and Insight-Oriented  Diagnosis:   ICD-10-CM   1. Generalized anxiety disorder  F41.1     Plan: Relaxation skills, mood independent behavior, exercise, positive self talk, self-care, radical acceptance.  Elbie Statzer, [facility]

## 2020-09-20 ENCOUNTER — Ambulatory Visit (INDEPENDENT_AMBULATORY_CARE_PROVIDER_SITE_OTHER): Payer: BC Managed Care – PPO | Admitting: Psychiatry

## 2020-09-20 ENCOUNTER — Other Ambulatory Visit: Payer: Self-pay

## 2020-09-20 ENCOUNTER — Encounter: Payer: Self-pay | Admitting: Psychiatry

## 2020-09-20 DIAGNOSIS — F411 Generalized anxiety disorder: Secondary | ICD-10-CM

## 2020-09-20 NOTE — Progress Notes (Signed)
      [facility] Counselor/Therapist Progress Note  Patient ID: Johnny [name], MRN: [id number],    Date: 09/20/2020  Time Spent: 50 minutes   Treatment Type: Individual Therapy  Reported Symptoms: anxiety  Mental Status Exam:  Appearance:   Casual     Behavior:  Appropriate  Motor:  Normal  Speech/Language:   Clear and Coherent  Affect:  Appropriate  Mood:  anxious  Thought process:  normal  Thought content:    WNL  Sensory/Perceptual disturbances:    WNL  Orientation:  oriented to person, place, time/date and situation  Attention:  Good  Concentration:  Good  Memory:  WNL  Fund of knowledge:   Good  Insight:    Good  Judgment:   Good  Impulse Control:  Good   Risk Assessment: Danger to Self:  No Self-injurious Behavior: No Danger to Others: No Duty to Warn:no Physical Aggression / Violence:No  Access to Firearms a concern: No  Gang Involvement:No   Subjective: The client is frustrated with the major obligations that include special needs son and his mother.  The client has wanted to just go out on a simple datet but can never leave his son alone.  His mother continues to decline living independently.  She has daily care but the client still has a lot of responsibility on a weekly basis with her.  Today we used the bilateral stimulation hand paddles as the client processed and discussed the difficulties that he has had.  Most recently he had an interview with a day worker that canceled at the last moment.  His son was very sad and upset because this is the fifth worker that has either canceled or not showin up for an interview.  The client is desperate to have somebody to be with his son during the day and keep him occupied so he can do his job.  The client described his son getting angry the other night which is a difficulty that the client finds difficult to control.  Luckily he was able to de-escalate his son and today his son is doing well. The client discussed the  desire to be able to live some of his own life.  He feels guilty about having to care for his mom and he feels resentful of having to care for his son.  As we discussed this the client was able to reduce his subjective units of distress from 6+ to less than 3.  He realizes that this is where he is in life and seeks to take it day by day.  We discussed mood independent behavior and the necessity for more radical acceptance with where he is.  Interventions: Motivational Interviewing, Solution-Oriented/Positive Psychology, CIT Group Desensitization and Reprocessing (EMDR) and Insight-Oriented  Diagnosis:   ICD-10-CM   1. Generalized anxiety disorder  F41.1     Plan: Radical acceptance, mood independent behavior, positive self talk, self-care, social network, continue to search for day workers for son.  Amiee Wiley, [facility]

## 2020-09-30 ENCOUNTER — Other Ambulatory Visit: Payer: Self-pay | Admitting: Psychiatry

## 2020-09-30 DIAGNOSIS — F411 Generalized anxiety disorder: Secondary | ICD-10-CM

## 2020-10-01 NOTE — Telephone Encounter (Signed)
Last apt 04/02/20 due back 2 months

## 2020-10-04 ENCOUNTER — Encounter: Payer: Self-pay | Admitting: Psychiatry

## 2020-10-04 ENCOUNTER — Ambulatory Visit (INDEPENDENT_AMBULATORY_CARE_PROVIDER_SITE_OTHER): Payer: BC Managed Care – PPO | Admitting: Psychiatry

## 2020-10-04 ENCOUNTER — Other Ambulatory Visit: Payer: Self-pay

## 2020-10-04 DIAGNOSIS — F411 Generalized anxiety disorder: Secondary | ICD-10-CM | POA: Diagnosis not present

## 2020-10-04 NOTE — Progress Notes (Signed)
      [facility] Counselor/Therapist Progress Note  Patient ID: Johnny [name], MRN: [id number],    Date: 10/04/2020  Time Spent: 50 minutes   Treatment Type: Individual Therapy  Reported Symptoms: anxiety  Mental Status Exam:  Appearance:   Casual     Behavior:  Appropriate  Motor:  Normal  Speech/Language:   Clear and Coherent  Affect:  Appropriate  Mood:  anxious  Thought process:  normal  Thought content:    WNL  Sensory/Perceptual disturbances:    WNL  Orientation:  oriented to person, place, time/date and situation  Attention:  Good  Concentration:  Good  Memory:  WNL  Fund of knowledge:   Good  Insight:    Good  Judgment:   Good  Impulse Control:  Good   Risk Assessment: Danger to Self:  No Self-injurious Behavior: No Danger to Others: No Duty to Warn:no Physical Aggression / Violence:No  Access to Firearms a concern: No  Gang Involvement:No   Subjective: Today the client's anxiety is at a subjective units of distress of 7+.  "I am not exercising I am just taking care of my son."  The client is overwhelmed with the amount of effort that it takes to manage his special needs son who is 32.  He states they interviewed a new day worker this morning.  He was impressed with the man and the client hopes that he can be his son's new day worker.  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  He is overwhelmed with care for his mother who calls him every day.  She has some level of dementia and has a hard time taking care of herself.  He has regular interaction with his ex-wife.  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 Today I used eye-movement with the client to help reduce his subjective units of distress to less than 4.  The client sees that there are places in his life where he can have some respite.  He  is planning a vacation to the beach in October that he can look forward to now.  He currently is not sleeping very well.  I encouraged the client to contact Dr. Clovis Pu and alerted him to that fact.  He stated he would.  The client will try to get back into a regular exercise routine.  He will also begin walking in his neighborhood.  I discussed with the client that I will be retiring at the end of April.  With the client's permission I am referring him to Dr. Barron Schmid.  Interventions: Assertiveness/Communication, Motivational Interviewing, Solution-Oriented/Positive Psychology, [employer] Group Desensitization and Reprocessing (EMDR) and Insight-Oriented  Diagnosis:   ICD-10-CM   1. Generalized anxiety disorder  F41.1     Plan: Mood independent behavior, positive self talk, self-care, walking, going to the gym, social network, mindful prayer.  Cobey Raineri, [facility]

## 2020-10-09 ENCOUNTER — Other Ambulatory Visit: Payer: Self-pay | Admitting: Psychiatry

## 2020-10-09 DIAGNOSIS — F5105 Insomnia due to other mental disorder: Secondary | ICD-10-CM

## 2020-10-10 NOTE — Telephone Encounter (Signed)
Last apt 03/2020 due 2 months

## 2020-10-18 ENCOUNTER — Ambulatory Visit: Payer: BC Managed Care – PPO | Admitting: Psychiatry

## 2020-10-25 DIAGNOSIS — Z20822 Contact with and (suspected) exposure to covid-19: Secondary | ICD-10-CM | POA: Diagnosis not present

## 2020-10-30 ENCOUNTER — Other Ambulatory Visit: Payer: Self-pay

## 2020-10-30 ENCOUNTER — Ambulatory Visit (INDEPENDENT_AMBULATORY_CARE_PROVIDER_SITE_OTHER): Payer: BC Managed Care – PPO | Admitting: Psychiatry

## 2020-10-30 ENCOUNTER — Encounter: Payer: Self-pay | Admitting: Psychiatry

## 2020-10-30 DIAGNOSIS — F5105 Insomnia due to other mental disorder: Secondary | ICD-10-CM

## 2020-10-30 DIAGNOSIS — G4730 Sleep apnea, unspecified: Secondary | ICD-10-CM

## 2020-10-30 DIAGNOSIS — G4710 Hypersomnia, unspecified: Secondary | ICD-10-CM | POA: Diagnosis not present

## 2020-10-30 DIAGNOSIS — F3162 Bipolar disorder, current episode mixed, moderate: Secondary | ICD-10-CM

## 2020-10-30 DIAGNOSIS — F411 Generalized anxiety disorder: Secondary | ICD-10-CM | POA: Diagnosis not present
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
## 2020-11-01 ENCOUNTER — Ambulatory Visit (INDEPENDENT_AMBULATORY_CARE_PROVIDER_SITE_OTHER): Payer: BC Managed Care – PPO | Admitting: Psychiatry

## 2020-11-01 ENCOUNTER — Encounter: Payer: Self-pay | Admitting: Psychiatry

## 2020-11-01 ENCOUNTER — Other Ambulatory Visit: Payer: Self-pay

## 2020-11-01 DIAGNOSIS — F411 Generalized anxiety disorder: Secondary | ICD-10-CM | POA: Diagnosis not present
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
## 2020-11-15 ENCOUNTER — Ambulatory Visit: Payer: BC Managed Care – PPO | Admitting: Psychiatry

## 2020-11-16 ENCOUNTER — Other Ambulatory Visit: Payer: Self-pay

## 2020-11-16 ENCOUNTER — Ambulatory Visit (INDEPENDENT_AMBULATORY_CARE_PROVIDER_SITE_OTHER): Payer: BC Managed Care – PPO | Admitting: Psychiatry

## 2020-11-16 DIAGNOSIS — F411 Generalized anxiety disorder: Secondary | ICD-10-CM | POA: Diagnosis not present

## 2020-11-16 DIAGNOSIS — Z638 Other specified problems related to primary support group: Secondary | ICD-10-CM | POA: Diagnosis not present

## 2020-11-16 DIAGNOSIS — F1911 Other psychoactive substance abuse, in remission: Secondary | ICD-10-CM

## 2020-11-16 DIAGNOSIS — F3162 Bipolar disorder, current episode mixed, moderate: Secondary | ICD-10-CM

## 2020-11-16 DIAGNOSIS — F4323 Adjustment disorder with mixed anxiety and depressed mood: Secondary | ICD-10-CM
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
## 2020-11-27 ENCOUNTER — Ambulatory Visit (INDEPENDENT_AMBULATORY_CARE_PROVIDER_SITE_OTHER): Payer: BC Managed Care – PPO | Admitting: Psychiatry

## 2020-11-27 ENCOUNTER — Other Ambulatory Visit: Payer: Self-pay

## 2020-11-27 DIAGNOSIS — F4323 Adjustment disorder with mixed anxiety and depressed mood: Secondary | ICD-10-CM | POA: Diagnosis not present

## 2020-11-27 DIAGNOSIS — Z638 Other specified problems related to primary support group: Secondary | ICD-10-CM

## 2020-11-27 DIAGNOSIS — F411 Generalized anxiety disorder: Secondary | ICD-10-CM

## 2020-11-27 DIAGNOSIS — F3162 Bipolar disorder, current episode mixed, moderate: Secondary | ICD-10-CM

## 2020-11-27 DIAGNOSIS — Z87898 Personal history of other specified conditions: Secondary | ICD-10-CM

## 2020-11-27 DIAGNOSIS — F3132 Bipolar disorder, current episode depressed, moderate: Secondary | ICD-10-CM | POA: Diagnosis not present

## 2020-11-27 NOTE — Progress Notes (Signed)
Psychotherapy Progress Note [name] Johnny Moore, PhD LP  Patient ID: Johnny [name]     MRN: [id number] Therapy format: Individual psychotherapy Date: 11/27/2020      Start: 4:00p     Stop: 4:50p     Time Spent: 50 min Location: In-person   Session narrative (presenting needs, interim history, self-report of stressors and symptoms, applications of prior therapy, status changes, and interventions made in session) Johnny [name] was in the ED about 4 days, then discharged.  [facility] (ECU) was recommended but unavailable.  Brought home, then following day Johnny [name] came over to the house for an intervention talk with him to get off the fence about violent threats.  Clear that he can remain if he can refrain from violent threats, still welcome as opposed to being in an [name].  He and Johnny [name] have tried to set up [employer] with a day program but the worker called off the interview, and the woman who was trying to operate the day service sounded relatively incompetent and flaky to Johnny [name]  No identified options right now.  Johnny [name] is managed by Johnny [name], known case manager, but overloaded.  Meanwhile, mother still repeat-calls.  Has caregiver hired in her home, and both Johnny [name] and Johnny [name] make scheduled daily calls, but her dementia really is progressing, and Johnny [name] cannot spread far enough to care for both, certainly not in his own home.  Support/empathy provided, discussed possible help of Alzheimer's Association for support and resources for decision-making.  Psychoed about how/when public funding is eligible to help with nursing home costs.  Therapeutic modalities: Cognitive Behavioral Therapy, Solution-Oriented/Positive Psychology, Customer service manager, and Faith-sensitive  Mental Status/Observations:  Appearance:   Casual     Behavior:  Appropriate  Motor:  Normal  Speech/Language:   Clear and Coherent  Affect:  Appropriate  Mood:  depressed  Thought process:  normal  Thought content:    WNL and worry   Sensory/Perceptual disturbances:    WNL  Orientation:  Fully oriented  Attention:  Good    Concentration:  Good  Memory:  WNL  Insight:    Good  Judgment:   Good  Impulse Control:  Good   Risk Assessment: Danger to Self: No Self-injurious Behavior: No Danger to Others: No Physical Aggression / Violence: No Duty to Warn: No Access to Firearms a concern: No  Assessment of progress:  situational setback(s)  Diagnosis:   ICD-10-CM   1. Generalized anxiety disorder  F41.1   2. Moderate mixed bipolar I disorder (HCC)  F31.62   3. Adjustment disorder with mixed anxiety and depressed mood  F43.23   4. Relationship problem with family member  Z63.8   5. History of substance use disorder  Z87.898    Plan:  Approach [place] case manager about options for [employer].  Be forward about the situation and what he needs. While working with Johnny [name] at home, prioritize looking for what the [name] behavior is that Johnny [name] could try for getting more of what he wants and to offer positive contingencies Alzheimer's Association for further support and resourcing re. mother Other recommendations/advice as may be noted above Continue to utilize previously learned skills ad lib Maintain medication as prescribed and work faithfully with relevant prescriber(s) if any changes are desired or seem indicated Call the clinic on-call service, present to ER, or call 911 if any life-threatening psychiatric crisis Return in about 2 weeks (around 12/11/2020). Already scheduled visit in this office 12/24/2020.  Johnny Serve, PhD Johnny Moore, PhD LP Clinical Psychologist,  Johnny [name] [name] [address] [place], [place] [address] [phone]

## 2020-11-30 ENCOUNTER — Ambulatory Visit: Payer: BC Managed Care – PPO | Admitting: Psychiatry

## 2020-12-13 ENCOUNTER — Ambulatory Visit (INDEPENDENT_AMBULATORY_CARE_PROVIDER_SITE_OTHER): Payer: BC Managed Care – PPO | Admitting: Psychiatry

## 2020-12-13 ENCOUNTER — Other Ambulatory Visit: Payer: Self-pay

## 2020-12-13 DIAGNOSIS — Z638 Other specified problems related to primary support group: Secondary | ICD-10-CM | POA: Diagnosis not present

## 2020-12-13 DIAGNOSIS — Z87898 Personal history of other specified conditions: Secondary | ICD-10-CM

## 2020-12-13 DIAGNOSIS — F3132 Bipolar disorder, current episode depressed, moderate: Secondary | ICD-10-CM

## 2020-12-13 DIAGNOSIS — F4323 Adjustment disorder with mixed anxiety and depressed mood: Secondary | ICD-10-CM

## 2020-12-13 DIAGNOSIS — F411 Generalized anxiety disorder: Secondary | ICD-10-CM

## 2020-12-13 DIAGNOSIS — F1911 Other psychoactive substance abuse, in remission: Secondary | ICD-10-CM

## 2020-12-13 NOTE — Progress Notes (Signed)
Psychotherapy Progress Note [facility] Johnny Moore, PhD LP  Patient ID: Johnny [name]     MRN: [id number] Therapy format: Individual psychotherapy Date: 12/13/2020      Start: 2:12p     Stop: 3:00p     Time Spent: 48 min Location: In-person   Session narrative (presenting needs, interim history, self-report of stressors and symptoms, applications of prior therapy, status changes, and interventions made in session) Urgent scheduling, on invitation.  Things are just awful.  Johnny [name] got back to [place] at [employer] after a couple bad incidents, e.g., conflict sparked over prompting him to take a shower and lay down a video game after midnight.  Johnny [name] took urging badly, went silent, locked himself in his room and called his mother at her 911 job to chew her out.  Had to commit him, after having just been to hospital last week.  Figures that relatives, and Johnny [name]'s mother's needs visiting, commanded attention Johnny [name] wanted so he acted up to force attention, creating a victim drama that would get him exited to the hospital.  For his outpatient treatment, can't get an active CST going or supervised placement b/c he is a Level 4 and most everyone is intimidated.  Mother fell, now in rehab in a "hellhole" -- [facility] home, noted B grade sanitation, smelled strongly of urine, gray food, the lesser of two evils insurance would pay for.  Fair likelihood she will transition into dementia care from here.  Hard to watch her so limited and cope with her losing function and awareness.  Would really like to break some loneliness by getting out with a friend or someone.   Has met a single woman, widow, would like to make time and energy to meet and try some time hanging out together.  Church has felt hollowed out with the prolonged search for a new Scientist, water quality Financial trader) following a legendary retirement.  Has a few friends, including a woman, who can be available some time.  Encouraged be in touch with any  available supports.  Therapeutic modalities: Cognitive Behavioral Therapy, Solution-Oriented/Positive Psychology, Customer service manager, and Faith-sensitive  Mental Status/Observations:  Appearance:   Casual     Behavior:  Appropriate  Motor:  Normal  Speech/Language:   Clear and Coherent  Affect:  Appropriate  Mood:  depressed  Thought process:  normal  Thought content:    WNL  Sensory/Perceptual disturbances:    WNL  Orientation:  Fully oriented  Attention:  Good    Concentration:  Good  Memory:  WNL  Insight:    Good  Judgment:   Good  Impulse Control:  Good   Risk Assessment: Danger to Self: No Self-injurious Behavior: No Danger to Others: No Physical Aggression / Violence: No Duty to Warn: No Access to Firearms a concern: No  Assessment of progress:  situational setback(s)  Diagnosis:   ICD-10-CM   1. Bipolar affective disorder, currently depressed, moderate (Johnny [name])  F31.32     2. Generalized anxiety disorder  F41.1     3. Adjustment disorder with mixed anxiety and depressed mood  F43.23     4. Relationship problem with family member  Z63.8     5. History of substance use disorder  Z87.898     6. History of substance abuse (Johnny [name])  F19.11      Plan:  Renew recommendation to Alzheimer's Association for camaraderie and awareness of resources with mother Can check into Medicaid availability for mother, even though her house was sold within the  5-year lookback. Reach out as able to support system just to break loneliness Other recommendations/advice as may be noted above Continue to utilize previously learned skills ad lib Maintain medication as prescribed and work faithfully with relevant prescriber(s) if any changes are desired or seem indicated Call the clinic on-call service, present to ER, or call 911 if any life-threatening psychiatric crisis Return in about 2 weeks (around 12/27/2020) for put on cancellation list, recommend scheduling ahead. Already scheduled visit  in this office 12/24/2020.  Johnny Serve, PhD Johnny Moore, PhD LP Clinical Psychologist, [facility] [address] [place], [place] [address] [phone]

## 2020-12-14 ENCOUNTER — Ambulatory Visit: Payer: BC Managed Care – PPO | Admitting: Psychiatry

## 2020-12-24 ENCOUNTER — Ambulatory Visit (INDEPENDENT_AMBULATORY_CARE_PROVIDER_SITE_OTHER): Payer: BC Managed Care – PPO | Admitting: Psychiatry

## 2020-12-24 ENCOUNTER — Other Ambulatory Visit: Payer: Self-pay

## 2020-12-24 ENCOUNTER — Encounter: Payer: Self-pay | Admitting: Psychiatry

## 2020-12-24 DIAGNOSIS — G4710 Hypersomnia, unspecified: Secondary | ICD-10-CM | POA: Diagnosis not present

## 2020-12-24 DIAGNOSIS — G4730 Sleep apnea, unspecified: Secondary | ICD-10-CM

## 2020-12-24 DIAGNOSIS — F3132 Bipolar disorder, current episode depressed, moderate: Secondary | ICD-10-CM | POA: Diagnosis not present

## 2020-12-24 DIAGNOSIS — F411 Generalized anxiety disorder: Secondary | ICD-10-CM

## 2020-12-24 DIAGNOSIS — F5105 Insomnia due to other mental disorder: Secondary | ICD-10-CM

## 2020-12-24 DIAGNOSIS — F4323 Adjustment disorder with mixed anxiety and depressed mood: Secondary | ICD-10-CM

## 2020-12-24 MED ORDER — OLANZAPINE 10 MG PO TABS: 10.0000 mg | ORAL_TABLET | Freq: Every day | ORAL | 1 refills | Status: DC

## 2020-12-24 NOTE — Patient Instructions (Addendum)
Start olanzapine 10 mg at night and reduce Seroquel to 1 of the 400 mg tablets for 5-7 days  then reduce Seroquel to 1/2 of Seroquel=200 mg for 1 week  then reduce Seroquel to 100 mg at night for a week  then reduce to 1/2 of 100 mg tablet at night for 1 week, then stop it.
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
## 2020-12-26 ENCOUNTER — Ambulatory Visit (INDEPENDENT_AMBULATORY_CARE_PROVIDER_SITE_OTHER): Payer: BC Managed Care – PPO | Admitting: Psychiatry

## 2020-12-26 ENCOUNTER — Other Ambulatory Visit: Payer: Self-pay

## 2020-12-26 DIAGNOSIS — Z636 Dependent relative needing care at home: Secondary | ICD-10-CM

## 2020-12-26 DIAGNOSIS — F411 Generalized anxiety disorder: Secondary | ICD-10-CM | POA: Diagnosis not present

## 2020-12-26 DIAGNOSIS — F3132 Bipolar disorder, current episode depressed, moderate: Secondary | ICD-10-CM | POA: Diagnosis not present

## 2020-12-26 DIAGNOSIS — Z638 Other specified problems related to primary support group: Secondary | ICD-10-CM | POA: Diagnosis not present
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
## 2021-01-08 ENCOUNTER — Other Ambulatory Visit: Payer: Self-pay

## 2021-01-08 ENCOUNTER — Ambulatory Visit (INDEPENDENT_AMBULATORY_CARE_PROVIDER_SITE_OTHER): Payer: BC Managed Care – PPO | Admitting: Psychiatry

## 2021-01-08 DIAGNOSIS — F3132 Bipolar disorder, current episode depressed, moderate: Secondary | ICD-10-CM | POA: Diagnosis not present

## 2021-01-08 DIAGNOSIS — F1911 Other psychoactive substance abuse, in remission: Secondary | ICD-10-CM

## 2021-01-08 DIAGNOSIS — Z638 Other specified problems related to primary support group: Secondary | ICD-10-CM

## 2021-01-08 DIAGNOSIS — Z636 Dependent relative needing care at home: Secondary | ICD-10-CM | POA: Diagnosis not present

## 2021-01-08 DIAGNOSIS — F411 Generalized anxiety disorder: Secondary | ICD-10-CM

## 2021-01-08 DIAGNOSIS — G43909 Migraine, unspecified, not intractable, without status migrainosus: Secondary | ICD-10-CM

## 2021-01-08 NOTE — Progress Notes (Signed)
Psychotherapy Progress Note [facility] Luan Moore, PhD LP  Patient ID: Johnny [name]     MRN: [id number] Therapy format: Individual psychotherapy Date: 01/08/2021      Start: 4:20p     Stop: 5:10p     Time Spent: 50 min Location: In-person   Session narrative (presenting needs, interim history, self-report of stressors and symptoms, applications of prior therapy, status changes, and interventions made in session) Johnny [name] remains in hospital in [place], awaiting a placement, calls daily.  Johnny [name] continue to work on finding a therapeutic roommate relationship.    Got mother out of the dismal rehab center, got her an adjustable hospital bed.  Has hired friend Johnny [name] looking in on her every day, and ex-wife Johnny [name] is Software engineer.  Johnny [name] is taking daily shifts at her house 4 or 5pm into the evening, but intimate care for bladder/bowel accidents is very provocative for smell and feeling he is violating her privacy.  Feeling badly in need of a respite, between prolonged needs, multiple situations, and some horrendously messy diaper incidents.    Resurgence of migraine headaches, awaiting acupuncture appt.  Migraine today, in fact.  Lowered lights in the room to accommodate.  Stress of mother's dementia sxs, certainly, finds himself getting increasingly irritated over things like her losing competency to work the microwave.  Normalized as part of dementing illness as well as any stress-related declines or auxiliary delirium she may be dealing with.  Discussed migraine control.  Benefits from acupuncture but too expensive to do regularly.  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  Does not recall having had any formal relaxation training.  Oriented conceptually to PMR, diaphragmatic breathing, and imagery-based tactics for release of tension suspected to activate  migraine.  Consolidated to an easy-to-remember technique ("smell the roses, blow on the candle") and encouraged practice engaging and stretching the out breath and imagining it like he is riding a deflating air mattress.  Psychoeducation about caffeine as well, which seems to be in favored headache remedy, possibly involved in rebound headache.  Has question how he got Bipolar diagnosis.  Referred to psychiatrist or former therapist, who would have memory beyond the reach of EHR to know.  Therapeutic modalities: Cognitive Behavioral Therapy, Solution-Oriented/Positive Psychology, Customer service manager, and Faith-sensitive  Mental Status/Observations:  Appearance:   Casual     Behavior:  Appropriate  Motor:  Normal  Speech/Language:   Clear and Coherent  Affect:  Appropriate  Mood:  depressed  Thought process:  normal  Thought content:    WNL  Sensory/Perceptual disturbances:    WNL and Headache   Orientation:  Fully oriented  Attention:  Good    Concentration:  Fair  Memory:  WNL  Insight:    Good  Judgment:   Fair  Impulse Control:  Good   Risk Assessment: Danger to Self: No Self-injurious Behavior: No Danger to Others: No Physical Aggression / Violence: No Duty to Warn: No Access to Firearms a concern: No  Assessment of progress:  progressing  Diagnosis:   ICD-10-CM   1. Bipolar affective disorder, currently depressed, moderate (Johnny [name])  F31.32     2. Generalized anxiety disorder  F41.1     3. Caregiver stress  Z63.6     4. Relationship problem with family member  Z63.8     5. Migraine without status migrainosus, not intractable, unspecified migraine type  G43.909  r/o caffeine rebound    6. History of substance abuse (Johnny [name])  F19.11      Plan:  Sent home short guide on PMR.  Use brief breathing technique Continue researching resources for mother and son's care Return to question of Bipolar diagnosis, evidence for it -- may inquire with Dr. Clovis Pu, who is more familiar  with his history, or could review diagnostic criteria to educate and gauge his own experiences Other recommendations/advice as may be noted above Continue to utilize previously learned skills ad lib Maintain medication as prescribed and work faithfully with relevant prescriber(s) if any changes are desired or seem indicated Call the clinic on-call service, present to ER, or call 911 if any life-threatening psychiatric crisis Return in about 2 weeks (around 01/22/2021). Already scheduled visit in this office 01/21/2021.  [name], PhD Luan Moore, PhD LP Clinical Psychologist, [facility] [address], [name], [place] [address] [phone]

## 2021-01-14 ENCOUNTER — Telehealth: Payer: Self-pay | Admitting: Psychiatry

## 2021-01-14 NOTE — Telephone Encounter (Signed)
Any of the usual migraine medicines are compatible with his psychiatric medications.  I am sure he is tried NSAIDs like ibuprofen and Tylenol.  If he is having classic migraine headaches that are not responding to that sort of treatment he should contact his primary care doctor.

## 2021-01-14 NOTE — Telephone Encounter (Signed)
Please review

## 2021-01-14 NOTE — Telephone Encounter (Signed)
Pt called and left a message asking about a recommendation of a migraine medicine that he could take that would not interfer with his current medications. Please give him a call at [phone]

## 2021-01-14 NOTE — Telephone Encounter (Signed)
LVM for pt to return call

## 2021-01-15 NOTE — Telephone Encounter (Signed)
Pt informed

## 2021-01-21 ENCOUNTER — Ambulatory Visit (INDEPENDENT_AMBULATORY_CARE_PROVIDER_SITE_OTHER): Payer: BC Managed Care – PPO | Admitting: Psychiatry

## 2021-01-21 ENCOUNTER — Other Ambulatory Visit: Payer: Self-pay

## 2021-01-21 DIAGNOSIS — F3132 Bipolar disorder, current episode depressed, moderate: Secondary | ICD-10-CM

## 2021-01-21 DIAGNOSIS — Z636 Dependent relative needing care at home: Secondary | ICD-10-CM | POA: Diagnosis not present

## 2021-01-21 DIAGNOSIS — G43909 Migraine, unspecified, not intractable, without status migrainosus: Secondary | ICD-10-CM

## 2021-01-21 DIAGNOSIS — F411 Generalized anxiety disorder: Secondary | ICD-10-CM | POA: Diagnosis not present

## 2021-01-21 DIAGNOSIS — Z87820 Personal history of traumatic brain injury: Secondary | ICD-10-CM
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
## 2021-01-23 DIAGNOSIS — F430 Acute stress reaction: Secondary | ICD-10-CM | POA: Diagnosis not present

## 2021-01-23 DIAGNOSIS — F319 Bipolar disorder, unspecified: Secondary | ICD-10-CM | POA: Diagnosis not present

## 2021-01-23 DIAGNOSIS — G43009 Migraine without aura, not intractable, without status migrainosus: Secondary | ICD-10-CM | POA: Diagnosis not present

## 2021-02-04 ENCOUNTER — Other Ambulatory Visit: Payer: Self-pay | Admitting: Psychiatry

## 2021-02-04 DIAGNOSIS — F411 Generalized anxiety disorder: Secondary | ICD-10-CM

## 2021-02-04 DIAGNOSIS — F3162 Bipolar disorder, current episode mixed, moderate: Secondary | ICD-10-CM

## 2021-02-05 ENCOUNTER — Other Ambulatory Visit: Payer: Self-pay

## 2021-02-05 ENCOUNTER — Ambulatory Visit (INDEPENDENT_AMBULATORY_CARE_PROVIDER_SITE_OTHER): Payer: BC Managed Care – PPO | Admitting: Psychiatry

## 2021-02-05 DIAGNOSIS — F3132 Bipolar disorder, current episode depressed, moderate: Secondary | ICD-10-CM

## 2021-02-05 DIAGNOSIS — F5105 Insomnia due to other mental disorder: Secondary | ICD-10-CM

## 2021-02-05 DIAGNOSIS — F411 Generalized anxiety disorder: Secondary | ICD-10-CM

## 2021-02-05 DIAGNOSIS — Z638 Other specified problems related to primary support group: Secondary | ICD-10-CM | POA: Diagnosis not present

## 2021-02-05 DIAGNOSIS — F1911 Other psychoactive substance abuse, in remission: Secondary | ICD-10-CM

## 2021-02-05 NOTE — Progress Notes (Signed)
Psychotherapy Progress Note [facility] Luan Moore, PhD LP  Patient ID: Johnny [name]     MRN: [id number] Therapy format: Individual psychotherapy Date: 02/05/2021      Start: 4:28p     Stop: 5:18p     Time Spent: 50 min Location: In-person   Session narrative (presenting needs, interim history, self-report of stressors and symptoms, applications of prior therapy, status changes, and interventions made in session) Still battling headache, but got Imitrex.  Took a couple days to make difference, seems to be stress/tension involved.    [name] is back home now from [place].  Have finally figured out that hospitals jettisoned his medicines and started fresh, and that's why he got so disruptive, enough to have to be sent away weeks ago.  Had a bad feeling when [name] called him to ask permission to go for a walk, knew it meant he wanted to get out of pocket and do something he shouldn't.  [name] did in fact walk out, catch a bus to a strip club but was not let in for lack of money and dress code.  [name] sees a mistake in watching an R rated movie together the night before, that lit up the idea.  [name] called police to take him to [facility], feeling he would be safer in care, and it was during evaluation there that they discovered the medication problem.  Released back to [name] custody and ran away again, calling a cab with only $4.  Cabbie reasoned with him, helped him with local bus fare to get downtown, but he ran out of money, stranded himself.  Again called police, and accepted ride home to [place].  Events are inducing more stress on [name] part about whether he can be managed outside of a group home or single supervised living facility, but it is clear enough on reflection that [name] has just learned real-word lessons about what he can and can't make happen, [name] was not overbearing about discipline, and medication has been restored to the experienced wisdom of what he had been on through  outpatient treatment with [facility].  Able to see with feedback how he may be able to trust more stability right now than he fears.  Successes noted using roses/candle breathing technique and teaching it to [place] to help with stress management for both of them.  Drawing closer as [name] has felt guilty and sad, asked why God made him this way, and [name] could comfort him, allow for normal, natural tears, and reframe it as God giving [employer], not [name].  [name] will see his psychiatrist tomorrow.  Challenges in management remain.  [facility] case manager notified him that the [facility], which looked like some hope for crisis stabilization, would work, but now she says it would not be appropriate, without saying why.  On suspicion that purposes were confused, recommend [name] check directly and then determine whether it would be an appropriate venue for crisis stabilization short of long-term housing.  For her part, [name] has been crabby of late, declaring she won't be visiting his mother soon and telling him he should have consulted before booking a vacation in October, apparently reading in that he was dumping [name] on her.  Reviewed his thinking on the topic, and in fact he was counting on her, but he had not planned to catch her short.  Encouraged to make another try at soliciting an agreement with [name] to work out supervision for her son for that time.  Backup plans may include hiring extra time from caregiver for his mother, and recommended the possibility of checking with [facility] or possibly [name] about respite care services.  He has already interviewed a couple of doubtful possibilities, who seem to turn tail when he mentions the possibility of having to restrain [name].  Advised that there may be less intensive ways to manage the situation if [name] does try to elope, namely reporting him runaway and asking police to fetch him.  Meanwhile, mother continues to decline cognitively, and [name] is engaged with  hospice trying to assess whether she would be suitable for palliative care.  Therapeutic modalities: Cognitive Behavioral Therapy, Solution-Oriented/Positive Psychology, and Ego-Supportive  Mental Status/Observations:  Appearance:   Casual     Behavior:  Appropriate  Motor:  Normal  Speech/Language:   Clear and Coherent  Affect:  Depressed  Mood:  dysthymic  Thought process:  normal  Thought content:    WNL and worry  Sensory/Perceptual disturbances:    WNL  Orientation:  Fully oriented  Attention:  Good    Concentration:  Fair  Memory:  WNL  Insight:    Good  Judgment:   Fair  Impulse Control:  Good   Risk Assessment: Danger to Self: No Self-injurious Behavior: No Danger to Others: No Physical Aggression / Violence: No Duty to Warn: No Access to Firearms a concern: No  Assessment of progress:  progressing  Diagnosis:   ICD-10-CM   1. Generalized anxiety disorder  F41.1     2. Bipolar affective disorder, currently depressed, moderate ([place])  F31.32     3. Insomnia due to mental condition  F51.05     4. Relationship problem with family member  Z63.8     5. History of substance abuse ([place])  F19.11      Plan:  Fact-check suitability of [place] for [name] and crisis service Look into respite care options for [name] while away, starting with dealmaking with his mother to share time or expenses of paid help, and including public assistance options Assertiveness tips Continue breathing technique for anger and stress management Other recommendations/advice as may be noted above Continue to utilize previously learned skills ad lib Maintain medication as prescribed and work faithfully with relevant prescriber(s) if any changes are desired or seem indicated Call the clinic on-call service, present to ER, or call 911 if any life-threatening psychiatric crisis Return in about 2 weeks (around 02/19/2021). Already scheduled visit in this office 02/19/2021.  [name], PhD Luan Moore, PhD LP Clinical Psychologist, [facility] [address] [place], [place] [address] [phone]

## 2021-02-19 ENCOUNTER — Ambulatory Visit (INDEPENDENT_AMBULATORY_CARE_PROVIDER_SITE_OTHER): Payer: BC Managed Care – PPO | Admitting: Psychiatry

## 2021-02-19 ENCOUNTER — Other Ambulatory Visit: Payer: Self-pay

## 2021-02-19 DIAGNOSIS — Z87820 Personal history of traumatic brain injury: Secondary | ICD-10-CM

## 2021-02-19 DIAGNOSIS — Z636 Dependent relative needing care at home: Secondary | ICD-10-CM

## 2021-02-19 DIAGNOSIS — F411 Generalized anxiety disorder: Secondary | ICD-10-CM | POA: Diagnosis not present

## 2021-02-19 DIAGNOSIS — F3132 Bipolar disorder, current episode depressed, moderate: Secondary | ICD-10-CM | POA: Diagnosis not present

## 2021-02-19 DIAGNOSIS — G43909 Migraine, unspecified, not intractable, without status migrainosus: Secondary | ICD-10-CM

## 2021-02-19 DIAGNOSIS — Z638 Other specified problems related to primary support group: Secondary | ICD-10-CM

## 2021-02-19 DIAGNOSIS — F1911 Other psychoactive substance abuse, in remission: Secondary | ICD-10-CM
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
## 2021-02-24 ENCOUNTER — Telehealth: Payer: Self-pay | Admitting: Psychiatry

## 2021-02-24 DIAGNOSIS — F3162 Bipolar disorder, current episode mixed, moderate: Secondary | ICD-10-CM

## 2021-02-25 NOTE — Telephone Encounter (Signed)
I was going to have him wean off of Seroquel and go onto olanzapine at his last appointment.  Could you ask if he did that?  He should not be taking a full dose of Seroquel with olanzapine.  If he needed a low-dose for sleep that is possible but he should not need Seroquel for sleep when he is on olanzapine.

## 2021-02-26 NOTE — Telephone Encounter (Signed)
Please call him about this.  See my previous note.

## 2021-02-27 NOTE — Telephone Encounter (Signed)
I'm going to go ahead and refuse it. If he actually is needing it I'm sure he will call.

## 2021-02-27 NOTE — Telephone Encounter (Signed)
I have been trying to reach him he has not called back

## 2021-02-28 ENCOUNTER — Other Ambulatory Visit: Payer: Self-pay | Admitting: Psychiatry

## 2021-02-28 DIAGNOSIS — F3162 Bipolar disorder, current episode mixed, moderate: Secondary | ICD-10-CM

## 2021-02-28 MED ORDER — QUETIAPINE FUMARATE 400 MG PO TABS: 400.0000 mg | ORAL_TABLET | Freq: Every day | ORAL | 0 refills | Status: DC

## 2021-02-28 NOTE — Telephone Encounter (Signed)
Pt called back and said that he has been taking one seroquel at night to sleep,he said he had been doing it for a while now but will be running out soon. Please call him back to discuss why he was denied [phone]

## 2021-02-28 NOTE — Telephone Encounter (Signed)
Pt stated he was taking the olanzapine for a month and never got it refilled.He is also taking 1 Seroquel 400 mg tab and its working well for him.So as of right now he has been off olanzapine for a month and only taking 1 Seroquel.He did not reduce the Seroquel to 1/2 tab or stopped as instructed.

## 2021-02-28 NOTE — Telephone Encounter (Signed)
Please review sarah's message

## 2021-02-28 NOTE — Telephone Encounter (Signed)
Sent quetiapine 400 mg 1 tab hs.  He stopped olanzapine.

## 2021-03-01 ENCOUNTER — Other Ambulatory Visit: Payer: Self-pay | Admitting: Psychiatry

## 2021-03-01 DIAGNOSIS — F411 Generalized anxiety disorder: Secondary | ICD-10-CM

## 2021-03-01 NOTE — Telephone Encounter (Signed)
Appt 9/7 filled 7/25

## 2021-03-03 ENCOUNTER — Other Ambulatory Visit: Payer: Self-pay | Admitting: Psychiatry

## 2021-03-03 DIAGNOSIS — F3162 Bipolar disorder, current episode mixed, moderate: Secondary | ICD-10-CM

## 2021-03-05 ENCOUNTER — Other Ambulatory Visit: Payer: Self-pay

## 2021-03-05 ENCOUNTER — Ambulatory Visit (INDEPENDENT_AMBULATORY_CARE_PROVIDER_SITE_OTHER): Payer: BC Managed Care – PPO | Admitting: Psychiatry

## 2021-03-05 DIAGNOSIS — Z636 Dependent relative needing care at home: Secondary | ICD-10-CM | POA: Diagnosis not present

## 2021-03-05 DIAGNOSIS — Z638 Other specified problems related to primary support group: Secondary | ICD-10-CM | POA: Diagnosis not present

## 2021-03-05 DIAGNOSIS — Z87820 Personal history of traumatic brain injury: Secondary | ICD-10-CM

## 2021-03-05 DIAGNOSIS — F411 Generalized anxiety disorder: Secondary | ICD-10-CM

## 2021-03-05 DIAGNOSIS — F3132 Bipolar disorder, current episode depressed, moderate: Secondary | ICD-10-CM | POA: Diagnosis not present

## 2021-03-05 NOTE — Progress Notes (Signed)
Psychotherapy Progress Note [facility] Luan Moore, PhD LP  Patient ID: Johnny [name]     MRN: [id number] Therapy format: Individual psychotherapy Date: 03/05/2021      Start: 4:22p     Stop: 5:12p     Time Spent: 50 min Location: In-person   Session narrative (presenting needs, interim history, self-report of stressors and symptoms, applications of prior therapy, status changes, and interventions made in session) 8/18 [name] took off again, at night, downtown, seeking a strip club.  Went to [employer], got dehydrated, EMS called for decision-making, went to [facility] ER for hydration and Ativan, still had to wait.  He got [name] and her husband to buy him tobacco and used his $10 allowance.  In his worst elopement ever, [name] tagged along with another guy who led him in stealing from stores and trying a botched stickup.  Now suspects [name]'s psychiatrist will be retiring, needs a referral for backup.  Discussed a few options.    Feels he is being crushed under the weight of managing care for mother and [name] simultaneously.  Credited with idea to ask [name] for respite care tomorrow while he meets with psychiatrist, and for working a deal with [name] to earn privileges.  Briefed on best tactics, to include asking [name] his ideas.  Encouraged in looking for small deals and small, renewable rewards for compliance, e.g., helping out with his grandmother's care.    Recent dental work.    Therapeutic modalities: Cognitive Behavioral Therapy and Solution-Oriented/Positive Psychology  Mental Status/Observations:  Appearance:   Casual     Behavior:  Appropriate  Motor:  Normal  Speech/Language:   Clear and Coherent  Affect:  Appropriate  Mood:  depressed  Thought process:  normal  Thought content:    WNL and worry  Sensory/Perceptual disturbances:    WNL  Orientation:  Fully oriented  Attention:  Good    Concentration:  Fair  Memory:  WNL  Insight:    Good  Judgment:   Good   Impulse Control:  Good   Risk Assessment: Danger to Self: No Self-injurious Behavior: No Danger to Others: No Physical Aggression / Violence: No Duty to Warn: No Access to Firearms a concern: No  Assessment of progress:  stabilized  Diagnosis:   ICD-10-CM   1. Generalized anxiety disorder  F41.1     2. Caregiver stress  Z63.6     3. Bipolar affective disorder, currently depressed, moderate ([place])  F31.32     4. Relationship problem with family member  Z63.8     5. History of concussion  Z87.820      Plan:  Continue to look into resources for [name] to be in a residential placement of some kind, but meanwhile focus on lessons learned and opportunities to reinforce increments in better behavior Notice and thwart tendencies to agonize over [name]'s behavior and mother's decline, focus on working the problem Check with [name]/Medicaid and area authority on aging about options as mother needs higher level of care; may qualify for memory/skilled already, in which case more resources may be available Other recommendations/advice as may be noted above Continue to utilize previously learned skills ad lib Maintain medication as prescribed and work faithfully with relevant prescriber(s) if any changes are desired or seem indicated Call the clinic on-call service, present to ER, or call 911 if any life-threatening psychiatric crisis Return for time as available. Already scheduled visit in this office 03/12/2021.  [name], PhD Luan Moore, PhD LP Clinical  Psychologist, [place] [facility] [address] [place], [place] [address] [phone]

## 2021-03-12 ENCOUNTER — Other Ambulatory Visit: Payer: Self-pay

## 2021-03-12 ENCOUNTER — Ambulatory Visit (INDEPENDENT_AMBULATORY_CARE_PROVIDER_SITE_OTHER): Payer: BC Managed Care – PPO | Admitting: Psychiatry

## 2021-03-12 DIAGNOSIS — F1911 Other psychoactive substance abuse, in remission: Secondary | ICD-10-CM

## 2021-03-12 DIAGNOSIS — Z638 Other specified problems related to primary support group: Secondary | ICD-10-CM | POA: Diagnosis not present

## 2021-03-12 DIAGNOSIS — F411 Generalized anxiety disorder: Secondary | ICD-10-CM

## 2021-03-12 DIAGNOSIS — F3132 Bipolar disorder, current episode depressed, moderate: Secondary | ICD-10-CM | POA: Diagnosis not present

## 2021-03-12 DIAGNOSIS — Z636 Dependent relative needing care at home: Secondary | ICD-10-CM

## 2021-03-12 DIAGNOSIS — Z87820 Personal history of traumatic brain injury: Secondary | ICD-10-CM

## 2021-03-12 NOTE — Progress Notes (Signed)
Psychotherapy Progress Note [facility] Luan Moore, PhD LP  Patient ID: [name]     MRN: [id number] Therapy format: Individual psychotherapy Date: 03/12/2021      Start: 11:01a     Stop: 11:51a     Time Spent: 50 min Location: In-person   Session narrative (presenting needs, interim history, self-report of stressors and symptoms, applications of prior therapy, status changes, and interventions made in session) Has retained a service for some in-home care for mother ([facility]), who is more fully incontinent now, and does not know.  Has to take [facility] with him.  Last week, while Johnny [name] was occupied and he was trying out letting [place] stay home, Johnny [name] wound up getting anxious and called 911 to get himself picked up, saying he was feeling like harming himself (apparently untrue) and he stayed over at [facility] ED.  Apparently he worked the system he knows to deal with feeling too alone.  [facility] and [facility] will not take [place] for respite care or obs beds due to some MR designation.  His history with group day settings has meant getting triggered by others' noises.  Johnny [name] has taken him to mother's, but he can't handle that, either, and doesn't fel like it is manageable to supervise Johnny [name] while caring for mother, plus Johnny [name] may become agitated enough that he creates another crisis just when [place] needs to be free to concentrate.  Interviewing now for [employer] for [employer], who would occupy him daytime and be covered by [facility] case management.  Needs further personal care hours or a facility, really, for mother, who now believes she's in a facility and doesn't get fed regularly, when she is actually in her own home and Johnny [name], and a hired worker come in for daily coverage.  Trying to see if [facility] or [facility] can pull a 2-hour evening shift to do a meal, a change, and bedtime and shorten his overly long day.  Has another lead on a private care worker.  Still stymied  affording a facility because of the Medicaid lookback on the sale of her house, the proceeds of which were used to help buy the home he is in.  Discussed whether there e is room for nuance or doubt in how Medicaid covers.  He says [name] is short staffed, and possible he is unaware of some provisions.  Knows an [employer] account planner he can consult.  Meanwhile, discussed possibility of asking [name]/Medicaid for a more exact reading of when and how much mother would be eligible for assistance.  She is both incontinent and incompetent at this point, and he is spending down her modest savings now.  Aware she would have to spend that down below $2000 anyway to qualify, informed that there are differing subsidies for different levels of care, and the amount of delay to coverage depends on how much the house sale brought.  Worst case, he may have to use equity to pay for a time but then be eligible.  Therapeutic modalities: Cognitive Behavioral Therapy, Solution-Oriented/Positive Psychology, and Psycho-education/Bibliotherapy  Mental Status/Observations:  Appearance:   Casual     Behavior:  Appropriate  Motor:  Normal  Speech/Language:   Clear and Coherent  Affect:  Appropriate  Mood:  depressed, weary  Thought process:  normal and a bit dulled  Thought content:    worry  Sensory/Perceptual disturbances:    WNL  Orientation:  Fully oriented  Attention:  Good    Concentration:  Fair  Memory:  WNL  Insight:    Good  Judgment:   Good  Impulse Control:  Good   Risk Assessment: Danger to Self: No Self-injurious Behavior: No Danger to Others: No Physical Aggression / Violence: No Duty to Warn: No Access to Firearms a concern: No  Assessment of progress:  stabilized  Diagnosis:   ICD-10-CM   1. Generalized anxiety disorder  F41.1     2. Caregiver stress  Z63.6     3. Bipolar affective disorder, currently depressed, moderate ([place])  F31.32     4. Relationship problem with family member  Z63.8     5.  History of concussion  Z87.820     6. History of substance abuse ([place])  F19.11      Plan:  Recheck with [name]/Medicaid or atty Brynda Rim about exact terms of Medicaid coverage Follow through on plans to interview supplemental workers with Johnny [name] and mother Ask Johnny [name]'s case worker if she can work out any willingness for [employer] to take Johnny [name] in crisis for observation beds, since that is the service he seems to go for when he makes emergency calls Stay ready to ask questions, not assume the worse about what is available Other recommendations/advice as may be noted above Continue to utilize previously learned skills ad lib Maintain medication as prescribed and work faithfully with relevant prescriber(s) if any changes are desired or seem indicated Call the clinic on-call service, 988/hotline, present to ER, or call 911 if any life-threatening psychiatric crisis No follow-ups on file. Already scheduled visit in this office 03/13/2021.  Johnny Serve, PhD Luan Moore, PhD LP Clinical Psychologist, [facility] [address], [place], [place] [address] [phone]

## 2021-03-13 ENCOUNTER — Ambulatory Visit (INDEPENDENT_AMBULATORY_CARE_PROVIDER_SITE_OTHER): Payer: BC Managed Care – PPO | Admitting: Psychiatry

## 2021-03-13 ENCOUNTER — Encounter: Payer: Self-pay | Admitting: Psychiatry

## 2021-03-13 DIAGNOSIS — G4710 Hypersomnia, unspecified: Secondary | ICD-10-CM

## 2021-03-13 DIAGNOSIS — Z636 Dependent relative needing care at home: Secondary | ICD-10-CM

## 2021-03-13 DIAGNOSIS — Z638 Other specified problems related to primary support group: Secondary | ICD-10-CM | POA: Diagnosis not present

## 2021-03-13 DIAGNOSIS — G43909 Migraine, unspecified, not intractable, without status migrainosus: Secondary | ICD-10-CM

## 2021-03-13 DIAGNOSIS — F3162 Bipolar disorder, current episode mixed, moderate: Secondary | ICD-10-CM | POA: Diagnosis not present

## 2021-03-13 DIAGNOSIS — F411 Generalized anxiety disorder: Secondary | ICD-10-CM

## 2021-03-13 DIAGNOSIS — G3184 Mild cognitive impairment, so stated: Secondary | ICD-10-CM

## 2021-03-13 DIAGNOSIS — F5105 Insomnia due to other mental disorder: Secondary | ICD-10-CM

## 2021-03-13 DIAGNOSIS — G4730 Sleep apnea, unspecified: Secondary | ICD-10-CM

## 2021-03-13 MED ORDER — LORAZEPAM 1 MG PO TABS: 1.0000 mg | ORAL_TABLET | Freq: Three times a day (TID) | ORAL | 5 refills | Status: DC | PRN

## 2021-03-13 MED ORDER — CLONIDINE HCL 0.1 MG PO TABS: ORAL_TABLET | ORAL | 11 refills | Status: DC

## 2021-03-13 MED ORDER — LAMOTRIGINE 100 MG PO TABS: ORAL_TABLET | ORAL | 1 refills | Status: DC

## 2021-03-13 MED ORDER — QUETIAPINE FUMARATE 400 MG PO TABS: 400.0000 mg | ORAL_TABLET | Freq: Every day | ORAL | 1 refills | Status: DC

## 2021-03-13 MED ORDER — OXCARBAZEPINE 300 MG PO TABS: ORAL_TABLET | ORAL | 5 refills | Status: DC
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
## 2021-03-18 ENCOUNTER — Other Ambulatory Visit: Payer: Self-pay

## 2021-03-18 ENCOUNTER — Ambulatory Visit (INDEPENDENT_AMBULATORY_CARE_PROVIDER_SITE_OTHER): Payer: BC Managed Care – PPO | Admitting: Psychiatry

## 2021-03-18 DIAGNOSIS — F1911 Other psychoactive substance abuse, in remission: Secondary | ICD-10-CM

## 2021-03-18 DIAGNOSIS — F411 Generalized anxiety disorder: Secondary | ICD-10-CM

## 2021-03-18 DIAGNOSIS — Z636 Dependent relative needing care at home: Secondary | ICD-10-CM

## 2021-03-18 DIAGNOSIS — F3162 Bipolar disorder, current episode mixed, moderate: Secondary | ICD-10-CM | POA: Diagnosis not present

## 2021-03-18 DIAGNOSIS — Z638 Other specified problems related to primary support group: Secondary | ICD-10-CM | POA: Diagnosis not present

## 2021-03-18 NOTE — Progress Notes (Signed)
Psychotherapy Progress Note [facility] Luan Moore, PhD LP  Patient ID: Johnny [name]     MRN: [id number] Therapy format: Individual psychotherapy Date: 03/18/2021      Start: 4:18p     Stop: 5:08p     Time Spent: 50 min Location: In-person   Session narrative (presenting needs, interim history, self-report of stressors and symptoms, applications of prior therapy, status changes, and interventions made in session) Sad every day with the combination of mother's failing memory and [name]'s poor judgment.  Tension headaches daily, typically 5-10 min before going over to mother's.  Still bringing [name] with him to mother's.  Has tried leaving [name] alone for 20 minutes to go to the grocery, successfully, and he managed without engaging police or requiring emergency action.  Seems clear that he has learned leaving, asking for hospitalization, and sometimes threats as ways of leveraging tense situations, and he needs to develop other ways.  Interviewed a Environmental manager last week, but getting her scheduled is mired in her delayed hiring process.  [name] has even asked when he can earn mom and dad's trust.  Discussed some points of how, checking possibilities of noticing increments in self-control, diversifying his responses to tension, showing patience with tension, ambiguity, or waiting.  Meeting with Pearson Grippe, MD next week bout future of [name]'s psychiatric care.  Suspects she will be retiring.  Exploring possibilities for [facility] to take a couple of evening shifts for mother's dinner, toilet, and bed.  Encouraged using a white board for mother to put up salient facts, support memory and orientation.  Tends to despair about same old draining things every day.  Sounds to be dwelling on hardship, often enough.  Led to notice small pieces of effectiveness, lest the dwelling on them out get him closer to angry outbursts or other counterproductive actions.    Idea for [name] to  come up with a menu of choices himself for feeling angry, to include standard options like walking, breathing, maybe writing out things he would want to say.  Now looking to take [name] to the beach in October, figuring it to be unrealistic to leave him home or have him in an institution.  Warming up the possibility of cutting short if behavior is too unruly.  Therapeutic modalities: Cognitive Behavioral Therapy, Solution-Oriented/Positive Psychology, and Ego-Supportive  Mental Status/Observations:  Appearance:   Casual     Behavior:  Appropriate  Motor:  Normal  Speech/Language:   Clear and Coherent  Affect:  Appropriate  Mood:  depressed  Thought process:  normal  Thought content:    Rumination  Sensory/Perceptual disturbances:    WNL  Orientation:  Fully oriented  Attention:  Good    Concentration:  Good  Memory:  WNL  Insight:    Fair  Judgment:   Good  Impulse Control:  Good   Risk Assessment: Danger to Self: No Self-injurious Behavior: No Danger to Others: No Physical Aggression / Violence: No Duty to Warn: No Access to Firearms a concern: No  Assessment of progress:  stabilized  Diagnosis:   ICD-10-CM   1. Moderate mixed bipolar I disorder (HCC)  F31.62     2. Generalized anxiety disorder  F41.1     3. Caregiver stress  Z63.6     4. Relationship problem with family member  Z63.8     5. History of substance abuse ([place])  F19.11      Plan:  Ideas for [name] -- have him brainstorm alternative  response to anger when willing, still look for small reward-able behaviors and bits of self-control exercised and praise it Self-monitor for dwelling on hardship and missing good or mixed news, especially in [name]'s self-control and his own accomplishments Continue to endorse finding affordable extra help, clarify public assistance standards for mother, involve [name] if needed Other recommendations/advice as may be noted above Continue to utilize previously learned skills ad  lib Maintain medication as prescribed and work faithfully with relevant prescriber(s) if any changes are desired or seem indicated Call the clinic on-call service, 988/hotline, present to ER, or call 911 if any life-threatening psychiatric crisis Return for put on cancellation list. Already scheduled visit in this office 04/22/2021.  [name], PhD Luan Moore, PhD LP Clinical Psychologist, [facility] [address] [place], [place] [address] [phone]

## 2021-04-22 ENCOUNTER — Ambulatory Visit: Payer: BC Managed Care – PPO | Admitting: Psychiatry

## 2021-04-22 ENCOUNTER — Ambulatory Visit (INDEPENDENT_AMBULATORY_CARE_PROVIDER_SITE_OTHER): Payer: BC Managed Care – PPO | Admitting: Psychiatry

## 2021-04-22 ENCOUNTER — Other Ambulatory Visit: Payer: Self-pay

## 2021-04-22 DIAGNOSIS — Z636 Dependent relative needing care at home: Secondary | ICD-10-CM

## 2021-04-22 DIAGNOSIS — F411 Generalized anxiety disorder: Secondary | ICD-10-CM

## 2021-04-22 DIAGNOSIS — R519 Headache, unspecified: Secondary | ICD-10-CM | POA: Diagnosis not present

## 2021-04-22 DIAGNOSIS — F3162 Bipolar disorder, current episode mixed, moderate: Secondary | ICD-10-CM

## 2021-04-22 DIAGNOSIS — Z87820 Personal history of traumatic brain injury: Secondary | ICD-10-CM

## 2021-04-22 NOTE — Progress Notes (Signed)
Psychotherapy Progress Note [facility] Luan Moore, PhD LP  Patient ID: Johnny [name])    MRN: [id number] Therapy format: Individual psychotherapy Date: 04/22/2021      Start: 4:11p     Stop: 5:00p     Time Spent: 49 min Location: In-person   Session narrative (presenting needs, interim history, self-report of stressors and symptoms, applications of prior therapy, status changes, and interventions made in session) Wound up canceling the beach trip, d/t the work it would be taking [employer].  If can't be completely off duty, not worth it.    In the meantime, has met an interesting woman, [name], on [email or web address].  Attractive, faithful, very consonant in their faith and world view, have met and gotten out to lunch, she's met and befriended [name], and he's expressed he's happy for his dad.  Tension headaches are down markedly, smiling more.  Figures now that what he has been labelling migraines may be more about tension.  Getting in the truck to go to his mom's is reliable timing for tension and headache.  Last week of September, met a secondary caregiver, on referral from a church friend, then became a bit of a pain for making 4 short-notice calls on the schedule in 3 weeks.  Back to having to rely on himself for more time, 13 evenings the rest of this month.  Had to go over for a diaper blowout late one evening and, to his great disgust, shower her intimately.  Support/empathy provided.   Looking into sleep study for [name].  [name] has stepped in some, taking [name] to the fair today, and in December she will be taking [name] on a family trip to [place] and then [place] and back.  Still tense going sometimes with her involvement and judgment with [name].  Therapeutic modalities: Cognitive Behavioral Therapy, Solution-Oriented/Positive Psychology, and Ego-Supportive  Mental Status/Observations:  Appearance:   Casual     Behavior:  Appropriate  Motor:  Normal   Speech/Language:   Clear and Coherent  Affect:  Appropriate  Mood:  dysthymic and better  Thought process:  normal  Thought content:    WNL  Sensory/Perceptual disturbances:    WNL  Orientation:  Fully oriented  Attention:  Good    Concentration:  Fair  Memory:  WNL  Insight:    Good  Judgment:   Fair  Impulse Control:  Good   Risk Assessment: Danger to Self: No Self-injurious Behavior: No Danger to Others: No Physical Aggression / Violence: No Duty to Warn: No Access to Firearms a concern: No  Assessment of progress:  progressing  Diagnosis:   ICD-10-CM   1. Generalized anxiety disorder  F41.1     2. Caregiver stress  Z63.6     3. Moderate mixed bipolar I disorder (HCC)  F31.62     4. Nonintractable headache, unspecified chronicity pattern, unspecified headache type  R51.9     5. History of concussion  Z87.820      Plan:  Endorse relationship, seems to be helpful for morale and tension Continue looking into residential and community team approaches to [employer] service Same ideas for [name] -- have him brainstorm alternative response to anger when willing, still look for small reward-able behaviors and bits of self-control exercised and praise it Self-monitor for dwelling on hardship and missing good or mixed news, especially in [name]'s self-control and his own accomplishments Continue to endorse finding affordable extra help, clarify public assistance standards for mother, involve [name] if needed Other  recommendations/advice as may be noted above Continue to utilize previously learned skills ad lib Maintain medication as prescribed and work faithfully with relevant prescriber(s) if any changes are desired or seem indicated Call the clinic on-call service, 988/hotline, present to ER, or call 911 if any life-threatening psychiatric crisis Return for session(s) already scheduled. Already scheduled visit in this office 05/06/2021.  [name], PhD Luan Moore, PhD  LP Clinical Psychologist, [facility] [address], [place], [place] [id number] [phone]

## 2021-05-06 ENCOUNTER — Ambulatory Visit: Payer: BC Managed Care – PPO | Admitting: Psychiatry

## 2021-05-07 DIAGNOSIS — U071 COVID-19: Secondary | ICD-10-CM | POA: Diagnosis not present

## 2021-05-13 ENCOUNTER — Ambulatory Visit: Payer: BC Managed Care – PPO | Admitting: Psychiatry

## 2021-05-14 ENCOUNTER — Ambulatory Visit: Payer: BC Managed Care – PPO | Admitting: Psychiatry

## 2021-05-20 ENCOUNTER — Ambulatory Visit: Payer: BC Managed Care – PPO | Admitting: Psychiatry

## 2021-06-01 ENCOUNTER — Other Ambulatory Visit: Payer: Self-pay | Admitting: Psychiatry

## 2021-06-01 DIAGNOSIS — F3162 Bipolar disorder, current episode mixed, moderate: Secondary | ICD-10-CM

## 2021-06-03 ENCOUNTER — Ambulatory Visit (INDEPENDENT_AMBULATORY_CARE_PROVIDER_SITE_OTHER): Payer: BC Managed Care – PPO | Admitting: Psychiatry

## 2021-06-03 ENCOUNTER — Other Ambulatory Visit: Payer: Self-pay

## 2021-06-03 DIAGNOSIS — Z638 Other specified problems related to primary support group: Secondary | ICD-10-CM

## 2021-06-03 DIAGNOSIS — F3162 Bipolar disorder, current episode mixed, moderate: Secondary | ICD-10-CM

## 2021-06-03 DIAGNOSIS — F411 Generalized anxiety disorder: Secondary | ICD-10-CM | POA: Diagnosis not present

## 2021-06-03 DIAGNOSIS — Z636 Dependent relative needing care at home: Secondary | ICD-10-CM | POA: Diagnosis not present

## 2021-06-03 DIAGNOSIS — R519 Headache, unspecified: Secondary | ICD-10-CM

## 2021-06-03 DIAGNOSIS — U099 Post covid-19 condition, unspecified: Secondary | ICD-10-CM
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
## 2021-07-19 ENCOUNTER — Ambulatory Visit: Payer: BC Managed Care – PPO | Admitting: Psychiatry

## 2021-07-31 ENCOUNTER — Other Ambulatory Visit: Payer: Self-pay

## 2021-07-31 ENCOUNTER — Encounter: Payer: Self-pay | Admitting: Psychiatry

## 2021-07-31 ENCOUNTER — Ambulatory Visit (INDEPENDENT_AMBULATORY_CARE_PROVIDER_SITE_OTHER): Payer: BC Managed Care – PPO | Admitting: Psychiatry

## 2021-07-31 DIAGNOSIS — F411 Generalized anxiety disorder: Secondary | ICD-10-CM

## 2021-07-31 DIAGNOSIS — Z636 Dependent relative needing care at home: Secondary | ICD-10-CM | POA: Diagnosis not present

## 2021-07-31 DIAGNOSIS — G4730 Sleep apnea, unspecified: Secondary | ICD-10-CM

## 2021-07-31 DIAGNOSIS — F5105 Insomnia due to other mental disorder: Secondary | ICD-10-CM

## 2021-07-31 DIAGNOSIS — F3162 Bipolar disorder, current episode mixed, moderate: Secondary | ICD-10-CM | POA: Diagnosis not present

## 2021-07-31 DIAGNOSIS — G4710 Hypersomnia, unspecified: Secondary | ICD-10-CM
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
## 2021-08-02 ENCOUNTER — Ambulatory Visit: Payer: BC Managed Care – PPO | Admitting: Psychiatry

## 2021-08-16 ENCOUNTER — Other Ambulatory Visit: Payer: Self-pay

## 2021-08-16 ENCOUNTER — Ambulatory Visit (INDEPENDENT_AMBULATORY_CARE_PROVIDER_SITE_OTHER): Payer: BC Managed Care – PPO | Admitting: Psychiatry

## 2021-08-16 DIAGNOSIS — Z638 Other specified problems related to primary support group: Secondary | ICD-10-CM | POA: Diagnosis not present

## 2021-08-16 DIAGNOSIS — R519 Headache, unspecified: Secondary | ICD-10-CM

## 2021-08-16 DIAGNOSIS — Z636 Dependent relative needing care at home: Secondary | ICD-10-CM | POA: Diagnosis not present

## 2021-08-16 DIAGNOSIS — F3162 Bipolar disorder, current episode mixed, moderate: Secondary | ICD-10-CM | POA: Diagnosis not present

## 2021-08-16 DIAGNOSIS — F411 Generalized anxiety disorder: Secondary | ICD-10-CM

## 2021-08-16 DIAGNOSIS — Z87820 Personal history of traumatic brain injury: Secondary | ICD-10-CM
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
## 2021-09-06 ENCOUNTER — Other Ambulatory Visit: Payer: Self-pay

## 2021-09-06 ENCOUNTER — Ambulatory Visit (INDEPENDENT_AMBULATORY_CARE_PROVIDER_SITE_OTHER): Payer: BC Managed Care – PPO | Admitting: Psychiatry

## 2021-09-06 DIAGNOSIS — Z636 Dependent relative needing care at home: Secondary | ICD-10-CM

## 2021-09-06 DIAGNOSIS — F411 Generalized anxiety disorder: Secondary | ICD-10-CM | POA: Diagnosis not present

## 2021-09-06 DIAGNOSIS — F3162 Bipolar disorder, current episode mixed, moderate: Secondary | ICD-10-CM

## 2021-09-06 DIAGNOSIS — Z62820 Parent-biological child conflict: Secondary | ICD-10-CM

## 2021-09-06 DIAGNOSIS — G3184 Mild cognitive impairment, so stated: Secondary | ICD-10-CM
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
## 2021-09-26 ENCOUNTER — Ambulatory Visit (INDEPENDENT_AMBULATORY_CARE_PROVIDER_SITE_OTHER): Payer: BC Managed Care – PPO | Admitting: Psychiatry

## 2021-09-26 ENCOUNTER — Other Ambulatory Visit: Payer: Self-pay

## 2021-09-26 DIAGNOSIS — Z636 Dependent relative needing care at home: Secondary | ICD-10-CM

## 2021-09-26 DIAGNOSIS — Z62820 Parent-biological child conflict: Secondary | ICD-10-CM | POA: Diagnosis not present

## 2021-09-26 DIAGNOSIS — F411 Generalized anxiety disorder: Secondary | ICD-10-CM

## 2021-09-26 DIAGNOSIS — G3184 Mild cognitive impairment, so stated: Secondary | ICD-10-CM

## 2021-09-26 DIAGNOSIS — Z87820 Personal history of traumatic brain injury: Secondary | ICD-10-CM

## 2021-09-26 DIAGNOSIS — F3162 Bipolar disorder, current episode mixed, moderate: Secondary | ICD-10-CM | POA: Diagnosis not present
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
## 2021-09-29 ENCOUNTER — Other Ambulatory Visit: Payer: Self-pay | Admitting: Psychiatry

## 2021-09-29 DIAGNOSIS — F3162 Bipolar disorder, current episode mixed, moderate: Secondary | ICD-10-CM

## 2021-10-03 ENCOUNTER — Encounter: Payer: Self-pay | Admitting: Psychiatry

## 2021-10-03 ENCOUNTER — Other Ambulatory Visit: Payer: Self-pay | Admitting: Psychiatry

## 2021-10-03 ENCOUNTER — Ambulatory Visit (INDEPENDENT_AMBULATORY_CARE_PROVIDER_SITE_OTHER): Payer: BC Managed Care – PPO | Admitting: Psychiatry

## 2021-10-03 DIAGNOSIS — F411 Generalized anxiety disorder: Secondary | ICD-10-CM

## 2021-10-03 DIAGNOSIS — G4710 Hypersomnia, unspecified: Secondary | ICD-10-CM

## 2021-10-03 DIAGNOSIS — G4730 Sleep apnea, unspecified: Secondary | ICD-10-CM

## 2021-10-03 DIAGNOSIS — F3162 Bipolar disorder, current episode mixed, moderate: Secondary | ICD-10-CM | POA: Diagnosis not present

## 2021-10-03 DIAGNOSIS — Z636 Dependent relative needing care at home: Secondary | ICD-10-CM | POA: Diagnosis not present

## 2021-10-03 MED ORDER — OXCARBAZEPINE 600 MG PO TABS: ORAL_TABLET | ORAL | 1 refills | Status: DC

## 2021-10-03 MED ORDER — LAMOTRIGINE 100 MG PO TABS: ORAL_TABLET | ORAL | 1 refills | Status: DC

## 2021-10-03 MED ORDER — QUETIAPINE FUMARATE 300 MG PO TABS: 300.0000 mg | ORAL_TABLET | Freq: Every day | ORAL | 1 refills | Status: DC

## 2021-10-03 MED ORDER — LORAZEPAM 1 MG PO TABS: 1.0000 mg | ORAL_TABLET | Freq: Three times a day (TID) | ORAL | 3 refills | Status: DC | PRN
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
## 2021-10-10 ENCOUNTER — Ambulatory Visit (INDEPENDENT_AMBULATORY_CARE_PROVIDER_SITE_OTHER): Payer: BC Managed Care – PPO | Admitting: Psychiatry

## 2021-10-10 DIAGNOSIS — G4710 Hypersomnia, unspecified: Secondary | ICD-10-CM | POA: Diagnosis not present

## 2021-10-10 DIAGNOSIS — F411 Generalized anxiety disorder: Secondary | ICD-10-CM

## 2021-10-10 DIAGNOSIS — G3184 Mild cognitive impairment, so stated: Secondary | ICD-10-CM

## 2021-10-10 DIAGNOSIS — R519 Headache, unspecified: Secondary | ICD-10-CM

## 2021-10-10 DIAGNOSIS — G4730 Sleep apnea, unspecified: Secondary | ICD-10-CM

## 2021-10-10 DIAGNOSIS — Z62820 Parent-biological child conflict: Secondary | ICD-10-CM

## 2021-10-10 DIAGNOSIS — F3162 Bipolar disorder, current episode mixed, moderate: Secondary | ICD-10-CM | POA: Diagnosis not present
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
## 2021-10-23 ENCOUNTER — Other Ambulatory Visit: Payer: Self-pay | Admitting: Psychiatry

## 2021-10-23 DIAGNOSIS — F3162 Bipolar disorder, current episode mixed, moderate: Secondary | ICD-10-CM

## 2021-10-23 DIAGNOSIS — G4710 Hypersomnia, unspecified: Secondary | ICD-10-CM

## 2021-10-23 DIAGNOSIS — F5105 Insomnia due to other mental disorder: Secondary | ICD-10-CM

## 2021-10-23 DIAGNOSIS — N521 Erectile dysfunction due to diseases classified elsewhere: Secondary | ICD-10-CM

## 2021-10-23 DIAGNOSIS — F411 Generalized anxiety disorder: Secondary | ICD-10-CM

## 2021-10-24 ENCOUNTER — Ambulatory Visit (INDEPENDENT_AMBULATORY_CARE_PROVIDER_SITE_OTHER): Payer: BC Managed Care – PPO | Admitting: Psychiatry

## 2021-10-24 DIAGNOSIS — F411 Generalized anxiety disorder: Secondary | ICD-10-CM | POA: Diagnosis not present

## 2021-10-24 DIAGNOSIS — Z636 Dependent relative needing care at home: Secondary | ICD-10-CM | POA: Diagnosis not present

## 2021-10-24 DIAGNOSIS — R519 Headache, unspecified: Secondary | ICD-10-CM

## 2021-10-24 DIAGNOSIS — G3184 Mild cognitive impairment, so stated: Secondary | ICD-10-CM

## 2021-10-24 DIAGNOSIS — F3162 Bipolar disorder, current episode mixed, moderate: Secondary | ICD-10-CM | POA: Diagnosis not present

## 2021-10-24 DIAGNOSIS — G4710 Hypersomnia, unspecified: Secondary | ICD-10-CM | POA: Diagnosis not present

## 2021-10-24 DIAGNOSIS — G4730 Sleep apnea, unspecified: Secondary | ICD-10-CM

## 2021-10-24 DIAGNOSIS — Z62820 Parent-biological child conflict: Secondary | ICD-10-CM

## 2021-10-24 DIAGNOSIS — Z87820 Personal history of traumatic brain injury: Secondary | ICD-10-CM
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
## 2021-11-01 DIAGNOSIS — Z205 Contact with and (suspected) exposure to viral hepatitis: Secondary | ICD-10-CM | POA: Diagnosis not present

## 2021-11-01 DIAGNOSIS — R030 Elevated blood-pressure reading, without diagnosis of hypertension: Secondary | ICD-10-CM | POA: Diagnosis not present

## 2021-11-01 DIAGNOSIS — Z77120 Contact with and (suspected) exposure to mold (toxic): Secondary | ICD-10-CM | POA: Diagnosis not present

## 2021-11-25 ENCOUNTER — Ambulatory Visit (INDEPENDENT_AMBULATORY_CARE_PROVIDER_SITE_OTHER): Payer: BC Managed Care – PPO | Admitting: Psychiatry

## 2021-11-25 DIAGNOSIS — F3162 Bipolar disorder, current episode mixed, moderate: Secondary | ICD-10-CM | POA: Diagnosis not present

## 2021-11-25 DIAGNOSIS — F411 Generalized anxiety disorder: Secondary | ICD-10-CM

## 2021-11-25 DIAGNOSIS — G4710 Hypersomnia, unspecified: Secondary | ICD-10-CM | POA: Diagnosis not present

## 2021-11-25 DIAGNOSIS — R519 Headache, unspecified: Secondary | ICD-10-CM

## 2021-11-25 DIAGNOSIS — Z636 Dependent relative needing care at home: Secondary | ICD-10-CM | POA: Diagnosis not present

## 2021-11-25 DIAGNOSIS — Z62820 Parent-biological child conflict: Secondary | ICD-10-CM

## 2021-11-25 DIAGNOSIS — G4730 Sleep apnea, unspecified: Secondary | ICD-10-CM

## 2021-11-25 DIAGNOSIS — Z87820 Personal history of traumatic brain injury: Secondary | ICD-10-CM

## 2021-11-25 DIAGNOSIS — Z5989 Other problems related to housing and economic circumstances: Secondary | ICD-10-CM
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
## 2021-11-27 ENCOUNTER — Encounter: Payer: Self-pay | Admitting: Psychiatry

## 2021-11-27 ENCOUNTER — Ambulatory Visit (INDEPENDENT_AMBULATORY_CARE_PROVIDER_SITE_OTHER): Payer: BC Managed Care – PPO | Admitting: Psychiatry

## 2021-11-27 DIAGNOSIS — Z636 Dependent relative needing care at home: Secondary | ICD-10-CM

## 2021-11-27 DIAGNOSIS — G4710 Hypersomnia, unspecified: Secondary | ICD-10-CM

## 2021-11-27 DIAGNOSIS — F5105 Insomnia due to other mental disorder: Secondary | ICD-10-CM

## 2021-11-27 DIAGNOSIS — F3162 Bipolar disorder, current episode mixed, moderate: Secondary | ICD-10-CM | POA: Diagnosis not present

## 2021-11-27 DIAGNOSIS — F411 Generalized anxiety disorder: Secondary | ICD-10-CM

## 2021-11-27 DIAGNOSIS — G4730 Sleep apnea, unspecified: Secondary | ICD-10-CM
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
## 2021-12-10 ENCOUNTER — Ambulatory Visit (INDEPENDENT_AMBULATORY_CARE_PROVIDER_SITE_OTHER): Payer: BC Managed Care – PPO | Admitting: Psychiatry

## 2021-12-10 DIAGNOSIS — F3132 Bipolar disorder, current episode depressed, moderate: Secondary | ICD-10-CM

## 2021-12-10 DIAGNOSIS — Z636 Dependent relative needing care at home: Secondary | ICD-10-CM

## 2021-12-10 DIAGNOSIS — Z5989 Other problems related to housing and economic circumstances: Secondary | ICD-10-CM

## 2021-12-10 DIAGNOSIS — Z87820 Personal history of traumatic brain injury: Secondary | ICD-10-CM

## 2021-12-10 DIAGNOSIS — F411 Generalized anxiety disorder: Secondary | ICD-10-CM | POA: Diagnosis not present

## 2021-12-10 DIAGNOSIS — G4730 Sleep apnea, unspecified: Secondary | ICD-10-CM

## 2021-12-10 NOTE — Progress Notes (Signed)
Psychotherapy Progress Note [facility] Johnny Moore, PhD LP  Patient ID: Johnny [name])    MRN: [id number] Therapy format: Individual psychotherapy Date: 12/10/2021      Start: 3:18p     Stop: 4:06p     Time Spent: 48 min Location: In-person   Session narrative (presenting needs, interim history, self-report of stressors and symptoms, applications of prior therapy, status changes, and interventions made in session) Home environment -- Finally about to get carpet replaced in bedroom.  Has discovered smell of dead mice in the house now.    Headaches -- suspect combination of stress, disrupted sleep, chronic anxiety, and now air quality issues.   Energy -- perpetually under rested, appetite gone, not eating much at all  Johnny [name] -- remains at [facility] just about a month now, while red tape continues getting the [name] approved by the [facility].  Has started conversation with Johnny [name] (male half) about making arrangements to see OP psychiatrist here.    Mother -- still daily tending her, smells incredibly bad there, been through a 26-day duty that involved 4 weekends with 2-meal coverage, had absolutely odious laundry to deal with and can still smell it  talking about it.  June schedule also 26 days of pulling at least one shift a day, weekends 2.  Mother still expressing illusions/hallucinations about man in her apartment, other people coming through her apartment, and her parents in the lobby waiting for her.  Her appetite going down, too.  Treating her stuffed animals as children to take care of.  Urine smell is extra foul, with known stage 4 CKD.  Says he did call Johnny [name] but didn't get an answer back -- unclear how much got across.  Meanwhile, organization of care among Johnny [name], and Johnny [name] is starting to fray as well.  She has fallen, a few weeks back in what might have been another stroke.  Addressed care system failure in progress and resolved to take Johnny [name]  to Johnny [name], assess for UTI and other care needs, with likelihood she will refer for hospitalization, at which time hospital social worker may be able to [name] sort out both placement and eligibility for Medicaid, possibly going through a rehab stay with Medicare coverage.  It's that important to get her assessed right now.  Notes last week was coming to appt with psychiatrist, ruminating on maybe dying to be relieved of this agony, when he sighted a hearse, seemingly nowhere near where it normally would be, served as an Medical laboratory scientific officer.  Guarantees safety   Therapeutic modalities: Cognitive Behavioral Therapy and Solution-Oriented/Positive Psychology  Mental Status/Observations:  Appearance:   Casual     Behavior:  Appropriate  Motor:  Normal  Speech/Language:   slowed a bit  Affect:  Depressed  Mood:  depressed and very weary  Thought process:  normal  Thought content:    Rumination  Sensory/Perceptual disturbances:    grossly intact  Orientation:  Fully oriented  Attention:  Good    Concentration:  Fair  Memory:  grossly intact  Insight:    Good  Judgment:   Fair  Impulse Control:  Good   Risk Assessment: Danger to Self: No Self-injurious Behavior: No Danger to Others: No Physical Aggression / Violence: No Duty to Warn: No Access to Firearms a concern: No  Assessment of progress:   declining  Diagnosis:   ICD-10-CM   1. Bipolar affective disorder, currently depressed, moderate ([place])  F31.32     2. Generalized  anxiety disorder  F41.1     3. Caregiver stress  Z63.6     4. Sleep apnea, unspecified type -- by history  G47.30     5. History of concussion  Z87.820     6. Awaiting housing improvement  Z59.89      Plan:  Get Johnny [name] to Johnny [name], fully disclose her condition, makes sure haven't left anything out.  In the absence of that, try again with [name] -- make sure it's the APS emergency line Continuing offer to call Medicaid in session if it will straighten out needed facts about  coverage Tend air quality best possible, both at own place and mother's Continue informing and coordinating with prospective [name] for Johnny [name] Ask clearly for help organizing from [name] if needed Other recommendations/advice as may be noted above Continue to utilize previously learned skills ad lib Maintain medication as prescribed and work faithfully with relevant prescriber(s) if any changes are desired or seem indicated Call the clinic on-call service, 988/hotline, 911, or present to [facility] or ER if any life-threatening psychiatric crisis Return for session(s) already scheduled. Already scheduled visit in this office 12/31/2021.  Johnny Serve, PhD Johnny Moore, PhD LP Clinical Psychologist, [facility] [address], [place], [place] [address] [phone]

## 2021-12-20 ENCOUNTER — Other Ambulatory Visit: Payer: Self-pay | Admitting: Psychiatry

## 2021-12-20 DIAGNOSIS — F3162 Bipolar disorder, current episode mixed, moderate: Secondary | ICD-10-CM

## 2021-12-31 ENCOUNTER — Ambulatory Visit (INDEPENDENT_AMBULATORY_CARE_PROVIDER_SITE_OTHER): Payer: BC Managed Care – PPO | Admitting: Psychiatry

## 2021-12-31 DIAGNOSIS — Z87820 Personal history of traumatic brain injury: Secondary | ICD-10-CM

## 2021-12-31 DIAGNOSIS — Z636 Dependent relative needing care at home: Secondary | ICD-10-CM

## 2021-12-31 DIAGNOSIS — G4730 Sleep apnea, unspecified: Secondary | ICD-10-CM

## 2021-12-31 DIAGNOSIS — F411 Generalized anxiety disorder: Secondary | ICD-10-CM | POA: Diagnosis not present

## 2021-12-31 DIAGNOSIS — Z77118 Contact with and (suspected) exposure to other environmental pollution: Secondary | ICD-10-CM

## 2021-12-31 DIAGNOSIS — F3132 Bipolar disorder, current episode depressed, moderate: Secondary | ICD-10-CM

## 2021-12-31 NOTE — Progress Notes (Signed)
Psychotherapy Progress Note [facility] Johnny Moore, PhD LP  Patient ID: Johnny Johnny [name])    MRN: [id number] Therapy format: Individual psychotherapy Date: 12/31/2021      Start: 3:11p     Stop: 4:00p     Time Spent: 49 min Location: In-person   Session narrative (presenting needs, interim history, self-report of stressors and symptoms, applications of prior therapy, status changes, and interventions made in session) More fatigued with air quality lately.  Tomorrow Johnny Johnny [name] goes to the Johnny Johnny [name] in Johnny Johnny [name]  Finally got bathroom repairs done last week.  Neighbor won't acknowledge him, offended she won't apologize, but able to understand she may not have been raised to.  A/C died late last week, been oppressively hot in the house, in repairs today.  Tomorrow is the big road trip to pick up Johnny Johnny [name] in Johnny Johnny [name], take him to Johnny Johnny [name], and come back.  May get room rather than come all the way back, depending on energy.  Currently worn from the A/C work and overtime caring for mother.  Johnny Johnny [name] is turning out to be unreliable for schedule, will be terminating her and replacing.  Johnny Johnny [name], giving him access to discounts and referrals, at least, but he has not yet gotten mother checked out for Johnny [name], says he'[name] afraid to put her in his truck and try to take her to doctor for fear of her soiling the interior.  Just can't do it.  More ashamed to admit that he took the Lord'[name] name in vain once last week responding to the stress and the intense odors coming from his mother.  Says it is so pungent just going to her place, even with clean briefs on, that he has to open windows and run air to tolerate it, raising further questions whether she is actually in some respiratory distress herself and/or partly delirious from systemic illness, not simply demented, as tends to get assumed.  Also notes mom asked Johnny Johnny [name] if they meant to keep her in the place she'[name] in to die there.    All told, it sounds still  more alarming for the need to move along to appropriate, prompt health care assessment and disposition, and not let any looming question of costs and Johnny [name]'[name] assumption that she is Medicaid ineligible deter him.  Urged along to do it soon, even if it means calling an ambulance and going trough ER.  Secured his promise to address mother as soon as he is done with Johnny Johnny [name]'[name] issue tomorrow and rested for a night.  In light of the likely need for facility care, still claims he has spoken with two representatives of Medicaid, who claim there is such a tightening up about Medicaid eligibility that he cannot expect to have any exception to the property transfer rule, and the waiting list is on anyway -- which sounds like more got lost in translation or memory.  While I am still skeptical that the interaction with Medicaid has been as clear as portrayed, based on the discovery now many weeks ago online, I am content to let up on this point, and emphasize prompt and appropriate medical assessment and care decisions, given growing indications some of her condition is treatable and it may constitute medical neglect to wait much longer.  Relationship with Johnny Johnny [name] going smoothly, a reliable comfort, though her own health at some issue now.  Will be accompanying her to GI preop this week for an esophageal procedure.  More concerning is the fact that she needs a liver  transplant, but her insurance has declined the MRI her physician needs to validate it.  Encouraged trust in her physician to take the steps necessary, including preauth, and take heart that his companionship and spiritual support to her mean much.  Have any clarifying discussion needed to proceed.  Therapeutic modalities: Cognitive Behavioral Therapy, Solution-Oriented/Positive Psychology, Ego-Supportive, Faith-sensitive, and directive  Mental Status/Observations:  Appearance:   Casual     Behavior:  Appropriate  Motor:  Normal  Speech/Language:   Clear and  Coherent  Affect:  weary  Mood:  depressed  Thought process:  Some blocking, responsive  Thought content:    Rumination  Sensory/Perceptual disturbances:    WNL  Orientation:  Fully oriented  Attention:  Good    Concentration:  Fair  Memory:  grossly intact  Insight:    Good  Judgment:   Fair  Impulse Control:  Good   Risk Assessment: Danger to Self: No Self-injurious Behavior: No Danger to Others: No Physical Aggression / Violence: No Duty to Warn: No Access to Firearms a concern: No  Assessment of progress:   guarded  Diagnosis:   ICD-10-CM   1. Bipolar affective disorder, currently depressed, moderate (Johnny Johnny [name])  F31.32     2. Caregiver stress  Z63.6     3. Generalized anxiety disorder  F41.1     4. Sleep apnea, unspecified type -- by history  G47.30     5. Contact with and (suspected) exposure to other environmental pollution  Z77.118     6. History of concussion  Z87.820      Plan:  Promise to address mother'[name] condition after situating Johnny Johnny [name] -- fully inform her PCP and decide whether home health, or ambulance ride to the hospital.  Quite possible rehab or Hospice may be in the offing, in which case Medicaid-related affordability questions are not an issue.  Act on it ASAP, otherwise APS call will be made. Continuing offer to call Medicaid in session if it will straighten out understandings about coverage. Restore air quality best possible, both at own place and mother'[name] Continue fully informing and coordinating with prospective Johnny [name] for Johnny Johnny [name] Ask clearly for help organizing from Johnny Johnny [name] and Johnny Johnny [name] if needed May need to Johnny [name] assess for apnea as well Other recommendations/advice as may be noted above Continue to utilize previously learned skills ad lib Maintain medication as prescribed and work faithfully with relevant prescriber([name]) if any changes are desired or seem indicated Call the clinic on-call service, 988/hotline, 911, or present to [facility] or ER if any life-threatening  psychiatric crisis Return for session([name]) already scheduled. Already scheduled visit in this office 01/08/2022.  Johnny Serve, PhD Johnny Moore, PhD LP Clinical Psychologist, [facility] [address], [place], [place] [address] [phone]

## 2022-01-08 ENCOUNTER — Ambulatory Visit (INDEPENDENT_AMBULATORY_CARE_PROVIDER_SITE_OTHER): Payer: BC Managed Care – PPO | Admitting: Psychiatry

## 2022-01-08 DIAGNOSIS — F3132 Bipolar disorder, current episode depressed, moderate: Secondary | ICD-10-CM

## 2022-01-08 DIAGNOSIS — G4730 Sleep apnea, unspecified: Secondary | ICD-10-CM | POA: Diagnosis not present

## 2022-01-08 DIAGNOSIS — F411 Generalized anxiety disorder: Secondary | ICD-10-CM | POA: Diagnosis not present

## 2022-01-08 DIAGNOSIS — Z77118 Contact with and (suspected) exposure to other environmental pollution: Secondary | ICD-10-CM

## 2022-01-08 DIAGNOSIS — F5105 Insomnia due to other mental disorder: Secondary | ICD-10-CM

## 2022-01-08 DIAGNOSIS — Z636 Dependent relative needing care at home: Secondary | ICD-10-CM | POA: Diagnosis not present

## 2022-01-08 NOTE — Progress Notes (Signed)
Psychotherapy Progress Note [facility] Luan Moore, PhD LP  Patient ID: Johnny [name])    MRN: [id number] Therapy format: Individual psychotherapy Date: 01/08/2022      Start: 3:18p     Stop: 4:07p     Time Spent: 49 min Location: In-person   Session narrative (presenting needs, interim history, self-report of stressors and symptoms, applications of prior therapy, status changes, and interventions made in session) [name] made it to [place] successfully, he looks happy, knows he is going to church now with his [name] "parents", which is very encouraging.  Big, tiring day driving to [place], [place], and back, just exhausting.  Did get first call from [name] less than 48 hrs into it, but much impressed with how the collaboration went, and much impressed with their handling of behavior issues, and ability to provide benefit of his own experience managing [name]'s behavior.  Shows phone pictures of his mother's change in a year's time, grieving her deterioration.  Still has not gotten her to her physician or ER but has a new contact for private duty in-home help.  Says she has too little strength to get up by herself and spends hours by herself in her condition.  Supportively confronted the still-likelihood she has some reversible delirium on top of any dementia, and it is in fact urgent that she get medically assessed.  Also that there is no question she would qualify for facility care in the condition she's in, with good likelihood she can pass through a rehab stay covered by Medicare.  Notes made, on request, for clarity and reliability to act.  Failing this, will need to call in APS to assess at home, as delays and inability to work through the problem may constitute unintentional neglect.   [name] is looking to relocate here.  Very helpful with mom, very caring, relationship working out very well.  Affirmed and encouraged.  Therapeutic modalities: Cognitive  Behavioral Therapy, Solution-Oriented/Positive Psychology, Ego-Supportive, and directive  Mental Status/Observations:  Appearance:   Casual     Behavior:  Appropriate  Motor:  Normal  Speech/Language:   Clear, less slowed  Affect:  Appropriate  Mood:  depressed and less  Thought process:  normal  Thought content:    Rumination  Sensory/Perceptual disturbances:    WNL  Orientation:  Fully oriented  Attention:  Good    Concentration:  Fair  Memory:  grossly intact, limited working memory suggested  Insight:    Good  Judgment:   Fair  Impulse Control:  Good   Risk Assessment: Danger to Self: No Self-injurious Behavior: No Danger to Others: No Physical Aggression / Violence: No Duty to Warn: No Access to Firearms a concern: No  Assessment of progress:  stabilized  Diagnosis:   ICD-10-CM   1. Bipolar affective disorder, currently depressed, moderate ([place])  F31.32     2. Caregiver stress  Z63.6     3. Generalized anxiety disorder  F41.1     4. Sleep apnea, unspecified type -- by history  G47.30     5. Contact with and (suspected) exposure to other environmental pollution  Z77.118     6. Insomnia due to mental condition  F51.05      Plan:  Promise to address mother's condition ASAP -- fully inform her PCP, decide whether home health, ambulance ride to the hospital are appropriate.  Do no let phobias about financing or threat of soiling the car interfere.  If using own car, put down  plastic and absorbent material and ventilate to tolerate.  Remember quite possible rehab or Hospice may be appropriate, in which case Medicaid-related affordability questions are not an issue.  Act on it ASAP, otherwise APS call will be necessary. Continuing offer to call Medicaid in session if it will straighten out understandings about coverage. Restore air quality best possible, both at own place and mother's Continue fully informing and coordinating with prospective [name] for [name] Ask clearly  for help organizing from [place] and [name] if needed May need to better assess for apnea as well Other recommendations/advice as may be noted above Continue to utilize previously learned skills ad lib Maintain medication as prescribed and work faithfully with relevant prescriber(s) if any changes are desired or seem indicated Call the clinic on-call service, 988/hotline, 911, or present to [facility] or ER if any life-threatening psychiatric crisis Return for session(s) already scheduled. Already scheduled visit in this office 01/17/2022.  [name], PhD Luan Moore, PhD LP Clinical Psychologist, [facility] [address] [place], [place] [address] [phone]

## 2022-01-08 NOTE — Patient Instructions (Addendum)
[name] Very good job pulling off the transfer and seeing through the first crisis call.  It really sounds like it's set up to be good for him.  Mom Your mom is in bad enough shape that she needs a medical assessment, very soon.  It is Job 1 now. Still worth starting that with primary care office.  Just make sure they know what you tell me about incontinence, weakness, and hallucinations.  You can call an ambulance to take her to the emergency room also. Make sure you don't assume too much about what is not affordable care. As far as treatment is concerned, it sounds like she could very likey benefit from a hospital stay and assessment, and if she needs temporary rehabilitation at a nursing home, Medicare covers that.  (Medicare does not cover residential care; that's what the Medicaid question was about.) I still think there may be a path to get Medicaid, but a Education officer, museum will have to walk through it.  In the worst case, she is halfway along from not being eligible at all to being fully covered, so the "lookback" would mean she is partly covered for a nursing home, as long as the application gets done.

## 2022-01-17 ENCOUNTER — Ambulatory Visit (INDEPENDENT_AMBULATORY_CARE_PROVIDER_SITE_OTHER): Payer: BC Managed Care – PPO | Admitting: Psychiatry

## 2022-01-17 DIAGNOSIS — Z62820 Parent-biological child conflict: Secondary | ICD-10-CM

## 2022-01-17 DIAGNOSIS — F5105 Insomnia due to other mental disorder: Secondary | ICD-10-CM | POA: Diagnosis not present

## 2022-01-17 DIAGNOSIS — F3162 Bipolar disorder, current episode mixed, moderate: Secondary | ICD-10-CM

## 2022-01-17 DIAGNOSIS — Z636 Dependent relative needing care at home: Secondary | ICD-10-CM

## 2022-01-17 DIAGNOSIS — F411 Generalized anxiety disorder: Secondary | ICD-10-CM | POA: Diagnosis not present

## 2022-01-17 DIAGNOSIS — G3184 Mild cognitive impairment, so stated: Secondary | ICD-10-CM

## 2022-01-17 DIAGNOSIS — Z87820 Personal history of traumatic brain injury: Secondary | ICD-10-CM
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
## 2022-01-21 ENCOUNTER — Encounter: Payer: Self-pay | Admitting: Psychiatry

## 2022-01-21 ENCOUNTER — Ambulatory Visit (INDEPENDENT_AMBULATORY_CARE_PROVIDER_SITE_OTHER): Payer: BC Managed Care – PPO | Admitting: Psychiatry

## 2022-01-21 DIAGNOSIS — F5105 Insomnia due to other mental disorder: Secondary | ICD-10-CM | POA: Diagnosis not present

## 2022-01-21 DIAGNOSIS — F3162 Bipolar disorder, current episode mixed, moderate: Secondary | ICD-10-CM | POA: Diagnosis not present

## 2022-01-21 DIAGNOSIS — Z87820 Personal history of traumatic brain injury: Secondary | ICD-10-CM

## 2022-01-21 DIAGNOSIS — G3184 Mild cognitive impairment, so stated: Secondary | ICD-10-CM

## 2022-01-21 DIAGNOSIS — F411 Generalized anxiety disorder: Secondary | ICD-10-CM

## 2022-01-21 DIAGNOSIS — Z636 Dependent relative needing care at home: Secondary | ICD-10-CM | POA: Diagnosis not present

## 2022-01-21 DIAGNOSIS — G4710 Hypersomnia, unspecified: Secondary | ICD-10-CM

## 2022-01-21 DIAGNOSIS — G4730 Sleep apnea, unspecified: Secondary | ICD-10-CM

## 2022-01-21 MED ORDER — QUETIAPINE FUMARATE 300 MG PO TABS: 300.0000 mg | ORAL_TABLET | Freq: Every day | ORAL | 1 refills | Status: DC

## 2022-01-21 MED ORDER — LORAZEPAM 1 MG PO TABS: 1.0000 mg | ORAL_TABLET | Freq: Three times a day (TID) | ORAL | 3 refills | Status: DC | PRN
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
## 2022-01-27 ENCOUNTER — Ambulatory Visit: Payer: BC Managed Care – PPO | Admitting: Psychiatry

## 2022-01-28 NOTE — Progress Notes (Signed)
Admin note for non-service contact  Patient ID: Johnny [name]  MRN: [id number] DATE: 01/27/2022  Noshow for 3pm session.  Message received next day that he was occupied getting his mother to the hospital.  Very important, very difficult action to take, and a therapeutic goal.  No charge.  [name], PhD Luan Moore, PhD LP Clinical Psychologist, [facility] [address], [place], [place] [address] [phone]

## 2022-02-12 ENCOUNTER — Ambulatory Visit (INDEPENDENT_AMBULATORY_CARE_PROVIDER_SITE_OTHER): Payer: BC Managed Care – PPO | Admitting: Psychiatry

## 2022-02-12 DIAGNOSIS — F5105 Insomnia due to other mental disorder: Secondary | ICD-10-CM

## 2022-02-12 DIAGNOSIS — F3132 Bipolar disorder, current episode depressed, moderate: Secondary | ICD-10-CM | POA: Diagnosis not present

## 2022-02-12 DIAGNOSIS — G3184 Mild cognitive impairment, so stated: Secondary | ICD-10-CM | POA: Diagnosis not present

## 2022-02-12 DIAGNOSIS — G4710 Hypersomnia, unspecified: Secondary | ICD-10-CM

## 2022-02-12 DIAGNOSIS — Z636 Dependent relative needing care at home: Secondary | ICD-10-CM | POA: Diagnosis not present

## 2022-02-12 DIAGNOSIS — F411 Generalized anxiety disorder: Secondary | ICD-10-CM

## 2022-02-12 DIAGNOSIS — G4730 Sleep apnea, unspecified: Secondary | ICD-10-CM

## 2022-02-12 DIAGNOSIS — Z87820 Personal history of traumatic brain injury: Secondary | ICD-10-CM
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
## 2022-02-17 ENCOUNTER — Other Ambulatory Visit: Payer: Self-pay | Admitting: Psychiatry

## 2022-02-17 DIAGNOSIS — F3162 Bipolar disorder, current episode mixed, moderate: Secondary | ICD-10-CM

## 2022-02-27 ENCOUNTER — Telehealth: Payer: Self-pay | Admitting: Psychiatry

## 2022-02-27 ENCOUNTER — Ambulatory Visit (INDEPENDENT_AMBULATORY_CARE_PROVIDER_SITE_OTHER): Payer: BC Managed Care – PPO | Admitting: Psychiatry

## 2022-02-27 DIAGNOSIS — F3132 Bipolar disorder, current episode depressed, moderate: Secondary | ICD-10-CM

## 2022-02-27 DIAGNOSIS — G4710 Hypersomnia, unspecified: Secondary | ICD-10-CM

## 2022-02-27 DIAGNOSIS — G3184 Mild cognitive impairment, so stated: Secondary | ICD-10-CM | POA: Diagnosis not present

## 2022-02-27 DIAGNOSIS — Z636 Dependent relative needing care at home: Secondary | ICD-10-CM

## 2022-02-27 DIAGNOSIS — F411 Generalized anxiety disorder: Secondary | ICD-10-CM | POA: Diagnosis not present

## 2022-02-27 DIAGNOSIS — G4730 Sleep apnea, unspecified: Secondary | ICD-10-CM

## 2022-02-27 DIAGNOSIS — Z87820 Personal history of traumatic brain injury: Secondary | ICD-10-CM

## 2022-02-27 DIAGNOSIS — F5105 Insomnia due to other mental disorder: Secondary | ICD-10-CM

## 2022-02-27 NOTE — Telephone Encounter (Signed)
Called pharmacy and approved.

## 2022-02-27 NOTE — Telephone Encounter (Signed)
Yes okay to fill today

## 2022-02-27 NOTE — Telephone Encounter (Signed)
Please let me know if I can approve this early

## 2022-02-27 NOTE — Telephone Encounter (Signed)
Johnny [name] called at 11:25 because he is trying to refill his Lorazepam but the pharmacy has said it is 1 day too early.  He said it was dispensed on 01/27/22 but he didn't actually pick it up until 01/31/22.  He is going out of town later this afternoon after his appt with Dr. Rica Mote.  Please call the pharmacy and allow him to pick the medication up today so he will have it when he is out of town.  Kristopher [facility]en[facility] on [address].
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
## 2022-03-12 ENCOUNTER — Ambulatory Visit (INDEPENDENT_AMBULATORY_CARE_PROVIDER_SITE_OTHER): Payer: BC Managed Care – PPO | Admitting: Psychiatry

## 2022-03-12 DIAGNOSIS — F5105 Insomnia due to other mental disorder: Secondary | ICD-10-CM

## 2022-03-12 DIAGNOSIS — G3184 Mild cognitive impairment, so stated: Secondary | ICD-10-CM | POA: Diagnosis not present

## 2022-03-12 DIAGNOSIS — Z636 Dependent relative needing care at home: Secondary | ICD-10-CM | POA: Diagnosis not present

## 2022-03-12 DIAGNOSIS — F3132 Bipolar disorder, current episode depressed, moderate: Secondary | ICD-10-CM | POA: Diagnosis not present

## 2022-03-12 DIAGNOSIS — F411 Generalized anxiety disorder: Secondary | ICD-10-CM | POA: Diagnosis not present

## 2022-03-12 DIAGNOSIS — G4730 Sleep apnea, unspecified: Secondary | ICD-10-CM

## 2022-03-12 DIAGNOSIS — G4710 Hypersomnia, unspecified: Secondary | ICD-10-CM
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
## 2022-03-26 ENCOUNTER — Ambulatory Visit: Payer: BC Managed Care – PPO | Admitting: Psychiatry

## 2022-03-31 ENCOUNTER — Other Ambulatory Visit: Payer: Self-pay | Admitting: Psychiatry

## 2022-04-02 ENCOUNTER — Other Ambulatory Visit: Payer: Self-pay | Admitting: Psychiatry

## 2022-04-09 ENCOUNTER — Ambulatory Visit (INDEPENDENT_AMBULATORY_CARE_PROVIDER_SITE_OTHER): Payer: BC Managed Care – PPO | Admitting: Psychiatry

## 2022-04-09 ENCOUNTER — Encounter: Payer: Self-pay | Admitting: Psychiatry

## 2022-04-09 VITALS — BP 152/91 | HR 74

## 2022-04-09 DIAGNOSIS — F5105 Insomnia due to other mental disorder: Secondary | ICD-10-CM

## 2022-04-09 DIAGNOSIS — F411 Generalized anxiety disorder: Secondary | ICD-10-CM

## 2022-04-09 DIAGNOSIS — Z636 Dependent relative needing care at home: Secondary | ICD-10-CM

## 2022-04-09 DIAGNOSIS — F3132 Bipolar disorder, current episode depressed, moderate: Secondary | ICD-10-CM

## 2022-04-09 DIAGNOSIS — G3184 Mild cognitive impairment, so stated: Secondary | ICD-10-CM

## 2022-04-09 DIAGNOSIS — Z87820 Personal history of traumatic brain injury: Secondary | ICD-10-CM

## 2022-04-09 DIAGNOSIS — G4710 Hypersomnia, unspecified: Secondary | ICD-10-CM

## 2022-04-09 DIAGNOSIS — I10 Essential (primary) hypertension: Secondary | ICD-10-CM

## 2022-04-09 DIAGNOSIS — G4730 Sleep apnea, unspecified: Secondary | ICD-10-CM

## 2022-04-09 DIAGNOSIS — F1911 Other psychoactive substance abuse, in remission: Secondary | ICD-10-CM

## 2022-04-09 MED ORDER — CLONIDINE HCL 0.1 MG PO TABS: ORAL_TABLET | ORAL | 1 refills | Status: DC

## 2022-04-09 NOTE — Patient Instructions (Signed)
Increase clonidine to 2 tablets of the 0.1 mg in AM and night for anxiety, sleep, and blood pressure
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
## 2022-04-19 ENCOUNTER — Other Ambulatory Visit: Payer: Self-pay | Admitting: Psychiatry

## 2022-04-19 DIAGNOSIS — F3162 Bipolar disorder, current episode mixed, moderate: Secondary | ICD-10-CM

## 2022-04-23 ENCOUNTER — Ambulatory Visit (INDEPENDENT_AMBULATORY_CARE_PROVIDER_SITE_OTHER): Payer: BC Managed Care – PPO | Admitting: Psychiatry

## 2022-04-23 DIAGNOSIS — F3132 Bipolar disorder, current episode depressed, moderate: Secondary | ICD-10-CM

## 2022-04-23 DIAGNOSIS — Z636 Dependent relative needing care at home: Secondary | ICD-10-CM

## 2022-04-23 DIAGNOSIS — F5105 Insomnia due to other mental disorder: Secondary | ICD-10-CM

## 2022-04-23 DIAGNOSIS — F1911 Other psychoactive substance abuse, in remission: Secondary | ICD-10-CM

## 2022-04-23 DIAGNOSIS — G3184 Mild cognitive impairment, so stated: Secondary | ICD-10-CM | POA: Diagnosis not present

## 2022-04-23 DIAGNOSIS — F411 Generalized anxiety disorder: Secondary | ICD-10-CM | POA: Diagnosis not present

## 2022-04-23 DIAGNOSIS — G4710 Hypersomnia, unspecified: Secondary | ICD-10-CM

## 2022-04-23 DIAGNOSIS — G4730 Sleep apnea, unspecified: Secondary | ICD-10-CM

## 2022-04-23 DIAGNOSIS — Z87820 Personal history of traumatic brain injury: Secondary | ICD-10-CM

## 2022-04-23 NOTE — Progress Notes (Signed)
Psychotherapy Progress Note [facility] Johnny Moore, PhD LP  Patient ID: Johnny [name])    MRN: [id number] Therapy format: Individual psychotherapy Date: 04/23/2022      Start: 3:18p     Stop: 4:05p     Time Spent: 47 min Location: In-person   Session narrative (presenting needs, interim history, self-report of stressors and symptoms, applications of prior therapy, status changes, and interventions made in session) Trying to stay well vs. risks of respiratory infection.  Masking routinely, says not only escalating COVID risk but his own tendency to develop bronchitis even after allergen exposure.  Both masked voluntarily today.  Good news with Johnny [name] -- Johnny [name] texted asking for Johnny [name] to speak with Johnny [name], which triggered a knot in his stomach, but turned out to be how very well Johnny [name] is getting along there now.  Eating well, sleeping [name], [name] hygiene, [facility], and will actually be going with [name] to [place] for their family TG gathering.  All bodes well for his care and welfare, though Johnny [name] does worry about him catching COVID (again) going to a family gathering.  Assured Johnny [name]'s natural immunity will still be up for having had it recently, plus he was apparently also vaccinated recently.  Now working on the daunting task of moving Johnny [name] to his own house, which will probably happen 2-3 more weeks from now.  Makes sense to eliminate the commute to the [place], and certainly the hours and hours of unsupervised time in which she could fall, dehydrate, hallucinate, etc.  He has not yet met with the housing coordinator at [facility] to notify her, but says he will.  Delayed by repeated rescheduling due to people being out sick and family needs, and pete seems to think he can't or shouldn't say anything until he can get the meeting, but he's already miffed about the 7% rent increase coming there and the fact there have been no renovations done in 5 years.   Otherwise he's been preparing what would amount to unnecessary and probably counterproductive arguments, when all he actually needs to do is give notice, maybe thank them for the friendly help they've been over time, and ask if they can prorate the November bill.  Advised he keep it short and on target, and be willing to go ahead and let it be part of his phon message asking to speak that the meeting is about giving notice and working out finances, lest a vague message and his ominous voice make it seem more fraught with conflict, and that's why she's delaying returning the call.  Johnny [name] still wants to be on board with care. and she fits in the budget, especially since the relocation will save $900/mo.  Johnny [name]'s hallucinations are back, suggesting her UTI may be back as well.  Demanded a UA and culture before she was discharged from the ED on 10/5 but couldn't get any results (?).  (Assisted him today, looking up her results in the system -- negative for UTI.)  Best guess now that her hallucinations are more about dementia and/or delirium from sleep deprivation, circulatory problem, dehydration, hypoxia, or other causes.  She has been having delusions and hallucinations of gunshots.  Yesterday he found Johnny [name] laying on the edge of her bed, having navigated there herself, and Johnny [name] became alarmed that she was allowed (by [facility]) to be more mobile than she should.  As he is moving her soon, did not press about current care regime.  Did discuss  standards for care in his own home, including fall risk and bathing Johnny [name] has accepted).    Personal BP concern, acc to [place].  Says Johnny [name] wanted him to take BP when here.  Reports 174/94 on immediate measurement, 2nd reading 147/91 after a few minutes to calm.  Neither measurement taken with a supported arm.  Therapeutic modalities: Cognitive Behavioral Therapy, Solution-Oriented/Positive Psychology, and Ego-Supportive  Mental Status/Observations:  Appearance:   Casual      Behavior:  Appropriate  Motor:  Normal  Speech/Language:   Clear and Coherent  Affect:  Appropriate  Mood:  dysthymic and less  Thought process:  normal  Thought content:    Rumination  Sensory/Perceptual disturbances:    WNL  Orientation:  Fully oriented  Attention:  Good    Concentration:  Fair  Memory:  grossly intact  Insight:    Fair  Judgment:   Fair  Impulse Control:  Good   Risk Assessment: Danger to Self: No Self-injurious Behavior: No Danger to Others: No Physical Aggression / Violence: No Duty to Warn: No Access to Firearms a concern: No  Assessment of progress:  progressing  Diagnosis:   ICD-10-CM   1. Generalized anxiety disorder  F41.1     2. Bipolar affective disorder, currently depressed, moderate ([place])  F31.32     3. Caregiver stress  Z63.6     4. Mild cognitive impairment  G31.84     5. Sleep apnea with hypersomnolence  G47.10    G47.30     6. Insomnia due to mental condition  F51.05     7. History of concussion  Z87.820     8. History of substance abuse [facility])  F19.11      Plan:  May decide whether it's worth re-engaging for Medicaid, as mom surely qualifies, and it may turn out he cannot adequately manage her at home, either.  Continuing offer to make calls in session if it will help find paperwork or move along [name]/Medicaid/placement processes. Stay clear about mother's needs, be willing to pester Palliative Care or re-hospitalize to get adequate care moving.  Ensure hydration and medication and supervision enough to do so, call in a home health agency if necessary -- PCP can authorize.  Ask clearly for help organizing from [place], or [place] as appropriate to help get accurate, responsive contacts.  Worth identifying what nursing home to place her in if/when ready.   Continue to work with Johnny [name] and the [employer] as appropriate on Johnny [name]'s placement, and practice trust that it is working as stated. Maintain personal support with GF May  need to [name] assess for apnea treatment as well Other recommendations/advice as may be noted above Continue to utilize previously learned skills ad lib Maintain medication as prescribed and work faithfully with relevant prescriber(s) if any changes are desired or seem indicated Call the clinic on-call service, 988/hotline, 911, or present to [facility] or ER if any life-threatening psychiatric crisis Return for time as already scheduled. Already scheduled visit in this office 05/07/2022.  [name], PhD Johnny Moore, PhD LP Clinical Psychologist, [facility] [address], [place], [place] [address] [phone]

## 2022-04-29 ENCOUNTER — Other Ambulatory Visit: Payer: Self-pay | Admitting: Psychiatry

## 2022-05-07 ENCOUNTER — Ambulatory Visit (INDEPENDENT_AMBULATORY_CARE_PROVIDER_SITE_OTHER): Payer: BC Managed Care – PPO | Admitting: Psychiatry

## 2022-05-07 DIAGNOSIS — F5105 Insomnia due to other mental disorder: Secondary | ICD-10-CM

## 2022-05-07 DIAGNOSIS — F411 Generalized anxiety disorder: Secondary | ICD-10-CM

## 2022-05-07 DIAGNOSIS — Z636 Dependent relative needing care at home: Secondary | ICD-10-CM | POA: Diagnosis not present

## 2022-05-07 DIAGNOSIS — Z87820 Personal history of traumatic brain injury: Secondary | ICD-10-CM

## 2022-05-07 DIAGNOSIS — G4710 Hypersomnia, unspecified: Secondary | ICD-10-CM

## 2022-05-07 DIAGNOSIS — F3132 Bipolar disorder, current episode depressed, moderate: Secondary | ICD-10-CM

## 2022-05-07 DIAGNOSIS — G4730 Sleep apnea, unspecified: Secondary | ICD-10-CM

## 2022-05-07 DIAGNOSIS — G3184 Mild cognitive impairment, so stated: Secondary | ICD-10-CM

## 2022-05-07 DIAGNOSIS — F1911 Other psychoactive substance abuse, in remission: Secondary | ICD-10-CM

## 2022-05-07 NOTE — Progress Notes (Signed)
Psychotherapy Progress Note [facility] Johnny Moore, PhD LP  Patient ID: Johnny [name])    MRN: [id number] Therapy format: Individual psychotherapy Date: 05/07/2022      Start: 3:14p     Stop: 4:02p     Time Spent: 48 min Location: In-person   Session narrative (presenting needs, interim history, self-report of stressors and symptoms, applications of prior therapy, status changes, and interventions made in session) Got hair cut.  C/o gruff hair cutter who seemed put off by him masking.    Got arrangements made for mother's apartment, disappointed they won't prorate November.  Has a cleaner arranged to come through.  Still so demoralized about [facility] office handling his application that he can't abide even trying to apply, has not taken steps to work through.  Still inclined to just make the move to his house and be M's skilled facility until she expires.  Irked that [place] left the walker too close again, Mom used it and fell again.  Another hospital trip for hitting her head, 11-hrs at [facility], which [name] elected not to attend.  She can still have bad fecal incontinence that requires awkward and evocative shower service.  Encouraged again to work through placement and financing, as it is clear he is at risk for burning out, but set on the plan of moving her to his house and believes it will be enough.  Good friend Aaron Edelman, a former Engineer, maintenance (IT), died abruptly at [phone].  [name] has recently had bad chest pain, too, combining to make it harder for him to rest.  She needs an MRI and he says her elderly physician dropped the ball getting it preauthorized.  [name] continues to do well with the [employer], went with them to a [place] family reunion.  Have not yet seen the paperwork for the new [place].  Still has a visceral reaction to seeing a call coming in, but now a couple more times has gotten good reports, which may be helping calm that and deescalate.   Holiday plans will involve [name] coming back up for a week, 5 days at [name], which will present a logistical and emotional strain, but eventually admits he has made a considered choice.    Discussed tactics for dealing with when [name] is in the house.  Mostly advised to (1) make a prior agreement about how/where to decompress and (2) learn and import whatever evening winddown routine [name] use.  Agreed he already knows what to do with the more drastic imaginable situations, but recommend just mentally rehearsing noticing when it would be needed and what he would do.  Therapeutic modalities: Cognitive Behavioral Therapy, Solution-Oriented/Positive Psychology, and Ego-Supportive  Mental Status/Observations:  Appearance:   Casual     Behavior:  Appropriate  Motor:  Normal  Speech/Language:   Clear and Coherent  Affect:  Appropriate  Mood:  dysthymic  Thought process:  normal  Thought content:    Rumination  Sensory/Perceptual disturbances:    WNL  Orientation:  Fully oriented  Attention:  Good    Concentration:  Fair  Memory:  grossly intact  Insight:    Fair  Judgment:   Fair  Impulse Control:  Good   Risk Assessment: Danger to Self: No Self-injurious Behavior: No Danger to Others: No Physical Aggression / Violence: No Duty to Warn: No Access to Firearms a concern: No  Assessment of progress:  stabilized  Diagnosis:   ICD-10-CM   1. Generalized anxiety disorder  F41.1     2. Bipolar affective disorder, currently depressed, moderate ([place])  F31.32     3. Caregiver stress  Z63.6     4. Mild cognitive impairment  G31.84     5. Sleep apnea with hypersomnolence  G47.10    G47.30     6. Insomnia due to mental condition  F51.05     7. History of concussion  Z87.820     8. History of substance abuse [facility])  F19.11      Plan:  May decide whether it's worth re-engaging for Medicaid, as mom surely qualifies, and it may turn out he cannot adequately manage her at home,  either.  Continuing offer to make calls in session if it will help find paperwork or move along [name]/Medicaid/placement processes. Stay clear about mother's needs, be willing to pester Palliative Care or re-hospitalize to get adequate care moving.  Ensure hydration and medication and supervision enough to do so, call in a home health agency if necessary -- PCP can authorize.  Ask clearly for help organizing from [place], or [place] as appropriate to help get accurate, responsive contacts.  Worth identifying what nursing home to place her in if/when ready.   Continue to work with [name] and the [employer] as appropriate on [name]'s placement, and practice trust that it is working as stated. Plan and mentally rehearse constructive responses to [name] being in the house for holiday while mother is there, too. Maintain personal support with GF May need to better assess for apnea treatment as well Other recommendations/advice as may be noted above Continue to utilize previously learned skills ad lib Maintain medication as prescribed and work faithfully with relevant prescriber(s) if any changes are desired or seem indicated Call the clinic on-call service, 988/hotline, 911, or present to [facility] or ER if any life-threatening psychiatric crisis Return for as already scheduled, time as already scheduled. Already scheduled visit in this office 05/19/2022.  Johnny Serve, PhD Johnny Moore, PhD LP Clinical Psychologist, [facility] [address] [place], [place] [address] [phone]

## 2022-05-19 ENCOUNTER — Ambulatory Visit (INDEPENDENT_AMBULATORY_CARE_PROVIDER_SITE_OTHER): Payer: BC Managed Care – PPO | Admitting: Psychiatry

## 2022-05-19 DIAGNOSIS — G3184 Mild cognitive impairment, so stated: Secondary | ICD-10-CM

## 2022-05-19 DIAGNOSIS — G4730 Sleep apnea, unspecified: Secondary | ICD-10-CM

## 2022-05-19 DIAGNOSIS — F411 Generalized anxiety disorder: Secondary | ICD-10-CM | POA: Diagnosis not present

## 2022-05-19 DIAGNOSIS — Z638 Other specified problems related to primary support group: Secondary | ICD-10-CM

## 2022-05-19 DIAGNOSIS — F3132 Bipolar disorder, current episode depressed, moderate: Secondary | ICD-10-CM | POA: Diagnosis not present

## 2022-05-19 DIAGNOSIS — Z87820 Personal history of traumatic brain injury: Secondary | ICD-10-CM

## 2022-05-19 DIAGNOSIS — Z636 Dependent relative needing care at home: Secondary | ICD-10-CM

## 2022-05-19 DIAGNOSIS — G4710 Hypersomnia, unspecified: Secondary | ICD-10-CM

## 2022-05-19 NOTE — Progress Notes (Signed)
Psychotherapy Progress Note [facility] Luan Moore, PhD LP  Patient ID: CZESLAW MANOUKIAN)    MRN: [id number] Therapy format: Individual psychotherapy Date: 05/19/2022      Start: 4:07p     Stop: 4:57p     Time Spent: 50 min Location: In-person   Session narrative (presenting needs, interim history, self-report of stressors and symptoms, applications of prior therapy, status changes, and interventions made in session) M admitted to hospital after she got up last night.  Irritated that she got up when she's not supposed to, but from the story, she was unsupervised too long to expect otherwise.  Checks a message at arrival, from [facility] ED, wonders if he should return the call (yes).  Stood by while he made his way, albeit frustrated and prone to tell too much of the clinical story.  Allowed to complete, encouraged civility during.    She fell last week, too, and went to [facility].  Says he declined to go with her, and when she asked why, he told her it's basically because she disobeyed safe procedure.  Supportively confronted taking out his frustration and double-minded understanding of dementia.  Recruited him to either strengthen his home care arrangements, possibly with home health, paid caregivers, which look to be a short commitment anyway.  And consider again either re-applying for Medicaid, re-engaging APS, or approaching PCP about Hospice.  [name] continues with better days in his [name] in [place], came through his first colonoscopy.  Presently working through installing the last of safety equipment in his house, anticipating mother coming over.  Tomorrow is supposed to be moving day into his home.  Discussed logistics if she is not in a facility soon, e.g., take up [name] on her offer to sit with her -- don't get hung up on his resentment and aversion to letting [name] in his house, following her offer to come over to his house while he and [name] go out.  Really could not in any  way abide her being in his house unsupervised, does not trust her.  Blessed, in one sense, to learn of a new resident at [facility] with nothing by way of furniture.  Gifted her the love seat and microwave.    Therapeutic modalities: Cognitive Behavioral Therapy, Solution-Oriented/Positive Psychology, Ego-Supportive, and directive  Mental Status/Observations:  Appearance:   Casual     Behavior:  Rigid  Motor:  Normal  Speech/Language:   Clear and Coherent  Affect:  Appropriate  Mood:  depressed and irritable  Thought process:  normal  Thought content:    Rumination  Sensory/Perceptual disturbances:    WNL  Orientation:  Fully oriented  Attention:  Good    Concentration:  Fair  Memory:  grossly intact  Insight:    Fair  Judgment:   Fair  Impulse Control:  Good   Risk Assessment: Danger to Self: No Self-injurious Behavior: No Danger to Others: No Physical Aggression / Violence: No Duty to Warn: No Access to Firearms a concern: No  Assessment of progress:  stabilized  Diagnosis:   ICD-10-CM   1. Generalized anxiety disorder  F41.1     2. Bipolar affective disorder, currently depressed, moderate ([place])  F31.32     3. Caregiver stress  Z63.6     4. Mild cognitive impairment  G31.84     5. Sleep apnea with hypersomnolence  G47.10    G47.30     6. History of concussion  Z87.820     7. Relationship problem  with family member  Z63.8      Plan:  Mother's care -- Decide whether it's worth re-engaging Medicaid, APS, or Hospice, as it may soon turn out [place] cannot adequately manage her at home, either.  Continuing offer to make calls in session if it will help.  Stay clear about mother's needs, ensure hydration and medication and supervision enough to do so, or call in a home health agency, which PCP can order.  Ask clearly for help organizing from [place], or [place] as appropriate to help get accurate, responsive contacts.  Worth identifying what nursing home to place her in  if/when ready.   [name]'s care -- Continue to work with [name] and the [employer] as appropriate, and practice trust that it is working as stated.  Plan and mentally rehearse constructive responses to [name] being in the house for holiday while mother is there, too. Support -- Maintain personal support with Lucendia Herrlich and any available friends Sleep disorder -- May need to better assess for apnea treatment Other recommendations/advice as may be noted above Continue to utilize previously learned skills ad lib Maintain medication as prescribed and work faithfully with relevant prescriber(s) if any changes are desired or seem indicated Call the clinic on-call service, 988/hotline, 911, or present to [facility] or ER if any life-threatening psychiatric crisis Return for as already scheduled, time as available. Already scheduled visit in this office 06/03/2022.  [name], PhD Luan Moore, PhD LP Clinical Psychologist, [facility] [address] [place], [place] [address] [phone]

## 2022-05-25 ENCOUNTER — Other Ambulatory Visit: Payer: Self-pay | Admitting: Psychiatry

## 2022-06-03 ENCOUNTER — Ambulatory Visit: Payer: BC Managed Care – PPO | Admitting: Psychiatry

## 2022-06-09 ENCOUNTER — Other Ambulatory Visit: Payer: Self-pay | Admitting: Psychiatry

## 2022-06-09 DIAGNOSIS — F411 Generalized anxiety disorder: Secondary | ICD-10-CM

## 2022-06-12 ENCOUNTER — Other Ambulatory Visit: Payer: Self-pay | Admitting: Psychiatry

## 2022-06-12 DIAGNOSIS — F411 Generalized anxiety disorder: Secondary | ICD-10-CM

## 2022-06-12 NOTE — Telephone Encounter (Signed)
Please schedule appt

## 2022-06-16 ENCOUNTER — Ambulatory Visit (INDEPENDENT_AMBULATORY_CARE_PROVIDER_SITE_OTHER): Payer: BC Managed Care – PPO | Admitting: Psychiatry

## 2022-06-16 DIAGNOSIS — F411 Generalized anxiety disorder: Secondary | ICD-10-CM

## 2022-06-16 DIAGNOSIS — Z636 Dependent relative needing care at home: Secondary | ICD-10-CM

## 2022-06-16 DIAGNOSIS — Z638 Other specified problems related to primary support group: Secondary | ICD-10-CM

## 2022-06-16 DIAGNOSIS — G4730 Sleep apnea, unspecified: Secondary | ICD-10-CM

## 2022-06-16 DIAGNOSIS — Z87820 Personal history of traumatic brain injury: Secondary | ICD-10-CM

## 2022-06-16 DIAGNOSIS — G3184 Mild cognitive impairment, so stated: Secondary | ICD-10-CM | POA: Diagnosis not present

## 2022-06-16 DIAGNOSIS — F3132 Bipolar disorder, current episode depressed, moderate: Secondary | ICD-10-CM | POA: Diagnosis not present

## 2022-06-16 DIAGNOSIS — G4710 Hypersomnia, unspecified: Secondary | ICD-10-CM

## 2022-06-16 NOTE — Progress Notes (Signed)
Psychotherapy Progress Note [facility] Luan Moore, PhD LP  Patient ID: DENIEL BLAKEMAN)    MRN: [id number] Therapy format: Individual psychotherapy Date: 06/16/2022      Start: 4:12p     Stop: 5:00p     Time Spent: 48 min Location: In-person   Session narrative (presenting needs, interim history, self-report of stressors and symptoms, applications of prior therapy, status changes, and interventions made in session) Regretting bringing mom into the house now.  Overwhelming, worse than trying to care for [facility].  Thankfully not that many messes to clean up, but diapers become very tedious, mother is hallucinating regularly about her father coming to pick her up, and there are moments where she recognizes [name] as her (deceased) brother Jenny Reichmann.  Worn out, has to fight becoming very angry with her.  [name] is still employed to come over a couple hours, 5 days/wk.  She handles lunch, fixes bedding, gives showers.  Some nights mom waking him up, does not understand that when he goes to his room he is not leaving her.  She's scared to be alone, scared of intruders.  Can't navigate to the bathroom by herself, so he resorts to blockades and making her walker unavailable when he can't be on point.  Repetitive questions from her, getting powerfully annoyed with those.  Today made two signs reminding her (1) [name] is coming and (2) "This is my home."  Is making calls to find a facility, but finding nobody takes Medicare (obviously; [name] seems to misunderstand again that Medicare does not cover custodial care).  Palliative care ([employer]) seems to be eroding.  Only respite is when [name] is present and he can run errands or see [name], or when mom is asleep.    Meanwhile, [name] is decidedly coming home for Christmas 3 out of 6 days, which looks to make for an impossible scenario, with mom there, too, and [name]'s traditional short tolerance for her.  Will have to clear sharps, he knows.   Discussed options to get mom in a respite care (unlikely) or have [place] [facility] elsewhere (no options) or just be ready to call 911 if he tries to run.  For her part, [name] is frightened of [name], so can't call on her to help staff the house.  All he can see is a Social worker act seeing and caring for both.  Advised, if he is bound and determined to take it al himself, to rehearse responses to [name] and have a preview conversation of what he can do if he gets annoyed with his grandmother there.  Therapeutic modalities: Cognitive Behavioral Therapy, Solution-Oriented/Positive Psychology, and Ego-Supportive  Mental Status/Observations:  Appearance:   Casual     Behavior:  Appropriate  Motor:  Normal  Speech/Language:   Clear and Coherent  Affect:  Appropriate  Mood:  depressed and irritable  Thought process:  normal  Thought content:    Rumination  Sensory/Perceptual disturbances:    WNL  Orientation:  Fully oriented  Attention:  Good    Concentration:  Fair  Memory:  grossly intact  Insight:    Fair  Judgment:   Fair  Impulse Control:  Good   Risk Assessment: Danger to Self: No Self-injurious Behavior: No Danger to Others: No Physical Aggression / Violence: No Duty to Warn: No Access to Firearms a concern: No  Assessment of progress:  stabilized  Diagnosis:   ICD-10-CM   1. Bipolar affective disorder, currently depressed, moderate ([place])  F31.32  2. Caregiver stress  Z63.6     3. Generalized anxiety disorder  F41.1     4. Mild cognitive impairment  G31.84     5. Sleep apnea with hypersomnolence  G47.10    G47.30     6. History of concussion  Z87.820     7. Relationship problem with family member  Z63.8      Plan:  Christmas plan -- preplan and rehearse responses, brief [name] on the best ways he can help things work smoothly while he and his GM have to coexist Mother's care -- Decide whether it's worth re-engaging Medicaid, APS, or Hospice, as it may soon turn out  [place] cannot adequately manage her at home, either.  Continuing offer to make calls in session if it will help.  Stay clear about mother's needs, ensure hydration and medication and supervision enough to do so, or call in a home health agency, which PCP can order.  Ask clearly for help organizing from [place], or [place] as appropriate to help get accurate, responsive contacts.  Worth identifying what nursing home to place her in if/when ready.   [name]'s care -- Continue to work with [name] and the [employer] as appropriate, and practice trust that it is working as stated.  Plan and mentally rehearse constructive responses to [name] being in the house for holiday while mother is there, too. Support -- Maintain personal support with Lucendia Herrlich and any available friends Sleep disorder -- May need to better assess for apnea treatment Other recommendations/advice as may be noted above Continue to utilize previously learned skills ad lib Maintain medication as prescribed and work faithfully with relevant prescriber(s) if any changes are desired or seem indicated Call the clinic on-call service, 988/hotline, 911, or present to [facility] or ER if any life-threatening psychiatric crisis Return for as already scheduled. Already scheduled visit in this office 07/03/2022.  [name], PhD Luan Moore, PhD LP Clinical Psychologist, [facility] [address] [place], [place] [address] [phone]

## 2022-06-21 ENCOUNTER — Other Ambulatory Visit: Payer: Self-pay | Admitting: Psychiatry

## 2022-06-21 DIAGNOSIS — F3162 Bipolar disorder, current episode mixed, moderate: Secondary | ICD-10-CM

## 2022-06-23 NOTE — Telephone Encounter (Signed)
Please call to schedule appt, due this month.  

## 2022-06-28 ENCOUNTER — Other Ambulatory Visit: Payer: Self-pay | Admitting: Psychiatry

## 2022-07-03 ENCOUNTER — Ambulatory Visit (INDEPENDENT_AMBULATORY_CARE_PROVIDER_SITE_OTHER): Payer: BC Managed Care – PPO | Admitting: Psychiatry

## 2022-07-03 DIAGNOSIS — Z87820 Personal history of traumatic brain injury: Secondary | ICD-10-CM

## 2022-07-03 DIAGNOSIS — F411 Generalized anxiety disorder: Secondary | ICD-10-CM | POA: Diagnosis not present

## 2022-07-03 DIAGNOSIS — F3132 Bipolar disorder, current episode depressed, moderate: Secondary | ICD-10-CM | POA: Diagnosis not present

## 2022-07-03 DIAGNOSIS — Z636 Dependent relative needing care at home: Secondary | ICD-10-CM | POA: Diagnosis not present

## 2022-07-03 DIAGNOSIS — Z638 Other specified problems related to primary support group: Secondary | ICD-10-CM | POA: Diagnosis not present

## 2022-07-03 DIAGNOSIS — G3184 Mild cognitive impairment, so stated: Secondary | ICD-10-CM

## 2022-07-03 DIAGNOSIS — G4710 Hypersomnia, unspecified: Secondary | ICD-10-CM

## 2022-07-03 DIAGNOSIS — G4730 Sleep apnea, unspecified: Secondary | ICD-10-CM

## 2022-07-03 NOTE — Progress Notes (Signed)
Psychotherapy Progress Note [facility] Luan Moore, PhD LP  Patient ID: Johnny [name])    MRN: [id number] Therapy format: Individual psychotherapy Date: 07/03/2022      Start: 4:10p     Stop: 5:00p     Time Spent: 50 min Location: In-person   Session narrative (presenting needs, interim history, self-report of stressors and symptoms, applications of prior therapy, status changes, and interventions made in session) Johnny [name] in town, turned out Johnny [name] can take him for the afternoon so he didn't have to worry about bringing him here.  Has managed [name] than expected with Johnny [name] and mother both in the house.  No incidents going, Johnny [name] compliant, much to his relief.  Says Johnny [name] has "wigged out me".  Christmas night she snored terribly, slept in the next day, then took offense when he offered to take her home.  Not exactly clear, but it all come across like she's feeling hurt, can't tell why.  Resolved in session to text back that he'll call her later, would like to learn then about her feeling hurt, to understand, not react.  Resolved approach this evening from a "customer service " perspective.  Checked perceptions with him, validated that she does not seem to him to be repeating the impossibilities Johnny [name] showed him, nor is she suspecting Johnny [name] to be any competition.  Does know Johnny [name] has liver disease, plus recently confirmed diabetes, and now on Johnny [name] injections that tend to make her hangry.  Did have a bad moment the other morning where she told him the coffee he made was the worst she'd ever had, it was undrinkable.  Discussed perceptions and again resolved to approach her with an ear toward listening first and an eye toward haring from a hurt friend rather than a new foe.  Meanwhile, Johnny [name] is clearly more strongly medicated, and also clearly off his nerve.  Making no threats to act violently or elope.  Been a blessing that way.    Hurt by daughter Johnny [name], not getting in  touch this season, but possible that she did not reach out to her mother, either, only answered a call from her.    Mother's care remains extremely challenging, with routine diarrhea, routine hallucinations, full dependence for ADLs, and a persistent fall risk.  Some days he may feel tempted to drink again, but hasn't.  Still convinced he is stuck taking care of her all by himself basically with no eligibility for Medicaid due to her SS check being too high, but that sounds suspicious.  Looked up [place] Medicaid requirements online and determined that he is mistaken, going on the requirements for in-home assistance.  Facility care (full nursing, skilled, memory) only requires she make less than Medicaid pays for that level ($6k-9k), recouping all but $70 from her SS check.  Strongly urged to get back in touch with [facility] and make sure they are on the same subject about eligibility so he can see through to relief from odious, demoralizing, and arguably inadequate care.  Clear at this point he would not be picky where she goes, that anybody would be able to do [name] than he can muster.  Therapeutic modalities: Cognitive Behavioral Therapy and Solution-Oriented/Positive Psychology  Mental Status/Observations:  Appearance:   Casual     Behavior:  Appropriate  Motor:  Normal  Speech/Language:   Clear and Coherent  Affect:  Appropriate  Mood:  dysthymic  Thought process:  normal  Thought content:    WNL  Sensory/Perceptual disturbances:    WNL  Orientation:  Fully oriented  Attention:  Good    Concentration:  Good  Memory:  grossly intact  Insight:    Fair  Judgment:   Fair  Impulse Control:  Good   Risk Assessment: Danger to Self: No Self-injurious Behavior: No Danger to Others: No Physical Aggression / Violence: No Duty to Warn: No Access to Firearms a concern: No  Assessment of progress:  progressing  Diagnosis:   ICD-10-CM   1. Bipolar affective disorder, currently  depressed, moderate ([place])  F31.32     2. Caregiver stress  Z63.6     3. Generalized anxiety disorder  F41.1     4. Relationship problem with family member  Z63.8     5. Mild cognitive impairment  G31.84     6. Sleep apnea with hypersomnolence  G47.10    G47.30     7. History of concussion  Z87.820      Plan:  Holiday plan -- Walk out arrangements as they are, give Johnny [name] lots of strokes for being tolerant and helpful.  Follow through giving Johnny [name] an ear to hear what's wrong, suspend judgment, be ready to ask if she's suffering more with health conditions. Mother's care -- Decide whether it's worth re-engaging Medicaid, APS, or Hospice, as it may soon turn out Johnny [name] cannot adequately manage her at home, either.  Continuing offer to make calls in session if it will help.  Stay clear about mother's needs, ensure hydration and medication and supervision enough to do so, or call in a home health agency, which PCP can order.  Ask clearly for help organizing from [place], or [place] as appropriate to help get accurate, responsive contacts.  Worth identifying what nursing home to place her in if/when ready.   Johnny [name]'s care -- Continue to work with Johnny [name] and the [employer] as appropriate, and practice trust that it is working as stated.  Plan and mentally rehearse constructive responses to Johnny [name] being in the house for holiday while mother is there, too. Support -- Maintain personal support with Johnny [name] and any available friends Sleep disorder -- May need to [name] assess for apnea treatment Other recommendations/advice as may be noted above Continue to utilize previously learned skills ad lib Maintain medication as prescribed and work faithfully with relevant prescriber(s) if any changes are desired or seem indicated Call the clinic on-call service, 988/hotline, 911, or present to [facility] or ER if any life-threatening psychiatric crisis Return for time as available. Already scheduled visit in this  office 07/21/2022.  [name], PhD Luan Moore, PhD LP Clinical Psychologist, [facility] [address], [place], [place] [address] [phone]

## 2022-07-03 NOTE — Telephone Encounter (Signed)
Please call to schedule an appt  

## 2022-07-05 ENCOUNTER — Other Ambulatory Visit: Payer: Self-pay | Admitting: Psychiatry

## 2022-07-05 DIAGNOSIS — F411 Generalized anxiety disorder: Secondary | ICD-10-CM

## 2022-07-10 ENCOUNTER — Ambulatory Visit (INDEPENDENT_AMBULATORY_CARE_PROVIDER_SITE_OTHER): Payer: BC Managed Care – PPO | Admitting: Psychiatry

## 2022-07-10 ENCOUNTER — Encounter: Payer: Self-pay | Admitting: Psychiatry

## 2022-07-10 DIAGNOSIS — G4710 Hypersomnia, unspecified: Secondary | ICD-10-CM

## 2022-07-10 DIAGNOSIS — F3132 Bipolar disorder, current episode depressed, moderate: Secondary | ICD-10-CM | POA: Diagnosis not present

## 2022-07-10 DIAGNOSIS — Z636 Dependent relative needing care at home: Secondary | ICD-10-CM

## 2022-07-10 DIAGNOSIS — G4730 Sleep apnea, unspecified: Secondary | ICD-10-CM

## 2022-07-10 DIAGNOSIS — F5105 Insomnia due to other mental disorder: Secondary | ICD-10-CM | POA: Diagnosis not present

## 2022-07-10 DIAGNOSIS — F411 Generalized anxiety disorder: Secondary | ICD-10-CM

## 2022-07-10 DIAGNOSIS — F3162 Bipolar disorder, current episode mixed, moderate: Secondary | ICD-10-CM | POA: Diagnosis not present

## 2022-07-10 MED ORDER — CLONIDINE HCL 0.1 MG PO TABS: ORAL_TABLET | ORAL | 5 refills | Status: DC

## 2022-07-10 MED ORDER — LORAZEPAM 1 MG PO TABS: 1.0000 mg | ORAL_TABLET | Freq: Three times a day (TID) | ORAL | 2 refills | Status: DC | PRN

## 2022-07-10 MED ORDER — LAMOTRIGINE 100 MG PO TABS: ORAL_TABLET | ORAL | 1 refills | Status: DC

## 2022-07-10 MED ORDER — OXCARBAZEPINE 300 MG PO TABS: ORAL_TABLET | ORAL | 5 refills | Status: DC

## 2022-07-10 MED ORDER — QUETIAPINE FUMARATE 300 MG PO TABS: 300.0000 mg | ORAL_TABLET | Freq: Every day | ORAL | 1 refills | Status: DC

## 2022-07-10 NOTE — Progress Notes (Signed)
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 evaluation if experiencing any acute safety concerns, to include suicidal intent. Extensive discussion of this and how to manage death thoughts. Self care focused therapy as well and coping skills.  FU 2-3 mos for support  Lynder Parents MD, DFAPA  Please see After Visit Summary for patient specific instructions.  Future Appointments  Date Time Provider Kimberly  07/21/2022  3:00 PM [name], PhD CP-CP None  08/04/2022  3:00 PM [name], PhD CP-CP None  08/19/2022  4:00 PM Mitchum, Herbie Baltimore, PhD CP-CP None    No orders of the defined types were placed in this encounter.      -------------------------------

## 2022-07-21 ENCOUNTER — Ambulatory Visit: Payer: BC Managed Care – PPO | Admitting: Psychiatry

## 2022-07-21 NOTE — Progress Notes (Signed)
Admin note for non-service contact  Patient ID: Johnny [name]  MRN: [id number] DATE: 07/21/2022  Sched for 3pm today but called earlier, stating he couldn't be sure of attending d/t Hospice home care evaluation for mother, who remains in high need of dementia care and frequent physical assistance.  Ultiamtely unable to make scheduled time, but given how very serious the issue has been working out adequate care, the fact that he is working through today is of very significant therapeutic value.  Rule for missed appt.  Return as scheduled or as able.  [name], PhD Luan Moore, PhD LP Clinical Psychologist, [facility] [address], [place], [place] [address] [phone]

## 2022-08-04 ENCOUNTER — Ambulatory Visit (INDEPENDENT_AMBULATORY_CARE_PROVIDER_SITE_OTHER): Payer: BC Managed Care – PPO | Admitting: Psychiatry

## 2022-08-04 DIAGNOSIS — G4710 Hypersomnia, unspecified: Secondary | ICD-10-CM

## 2022-08-04 DIAGNOSIS — G3184 Mild cognitive impairment, so stated: Secondary | ICD-10-CM

## 2022-08-04 DIAGNOSIS — F3162 Bipolar disorder, current episode mixed, moderate: Secondary | ICD-10-CM

## 2022-08-04 DIAGNOSIS — Z636 Dependent relative needing care at home: Secondary | ICD-10-CM | POA: Diagnosis not present

## 2022-08-04 DIAGNOSIS — F411 Generalized anxiety disorder: Secondary | ICD-10-CM | POA: Diagnosis not present

## 2022-08-04 DIAGNOSIS — Z638 Other specified problems related to primary support group: Secondary | ICD-10-CM

## 2022-08-04 DIAGNOSIS — F3132 Bipolar disorder, current episode depressed, moderate: Secondary | ICD-10-CM | POA: Diagnosis not present

## 2022-08-04 DIAGNOSIS — G4730 Sleep apnea, unspecified: Secondary | ICD-10-CM

## 2022-08-04 DIAGNOSIS — Z87820 Personal history of traumatic brain injury: Secondary | ICD-10-CM

## 2022-08-04 NOTE — Progress Notes (Signed)
Psychotherapy Progress Note [facility] Johnny [name], Johnny [name]  Patient ID: Johnny [name])    MRN: [id number] Therapy format: Individual psychotherapy Date: 08/04/2022      Start: 3:14p     Stop: 4:03p     Time Spent: 49 min Location: In-person   Session narrative (presenting needs, interim history, self-report of stressors and symptoms, applications of prior therapy, status changes, and interventions made in session) Mother is now in [place] for a combination respite and rehab stay, coming home before long.  Has slept much [name], needed a long Saturday rest this weekend.  Aggravated by taking a few phone calls from them to reassure him she's being cared for, and then by a suspected novice social worker who has not followed through on calling 2 nursing homes as he at least believed she agreed to.  Also bothered by the erroneous promise that Hospice could help underwrite a nursing home.  Call fielded in session revealing she had not made those calls, and mom is scheduled to return home tomorrow.  Support/empathy provided and encouraged keeping cool.  Current Hospice support at home is a nurse 3/wk for nursing level care, CNA 2/wk bathing (up to 3).  He is told she has transitioned and is now 2-4 wk terminal, bedridden, have stopped meds, and are authorizing painkiller.  She has shown no appetite or thirst at [place].  Relieved to have all meds, these services, Foley catheter now, and the whole package, and to know they will take her another 5 days in February for respite care if needed.  When he thinks of the whole service, and the [place] credentials, he is moved to tears now, though still prone to throw off on the Education officer, museum.  Supportively confronted whether talking/thinking about the grievance is inflaming him instead, and be ready to let things work through, now that he knows it's all very temporary.    Got it straightened out with Johnny [name],  and no, she is not turning away.  Has gotten quality time together.    Aggrieved to have not heard from his daughter, Johnny [name], but not clear whether she knows.  Also bothered by being expected to co-pay her student loan and gearing up for an imagined fight with Johnny [name]  Knows she will claim he promised in the divorce, whereas he thinks he only agreed on a temporary, friendly basis, and now that he is more tapped out, he just can't.  Therapeutic modalities: Cognitive Behavioral Therapy, Solution-Oriented/Positive Psychology, and Ego-Supportive  Mental Status/Observations:  Appearance:   Casual     Behavior:  Appropriate  Motor:  Normal  Speech/Language:   Clear and Coherent  Affect:  Appropriate  Mood:  irritable and and moved, by turns  Thought process:  normal  Thought content:    Rumination  Sensory/Perceptual disturbances:    WNL  Orientation:  Fully oriented  Attention:  Good    Concentration:  Good  Memory:  grossly intact  Insight:    Fair  Judgment:   Fair  Impulse Control:  Good   Risk Assessment: Danger to Self: No Self-injurious Behavior: No Danger to Others: No Physical Aggression / Violence: No Duty to Warn: No Access to Firearms a concern: No  Assessment of progress:  stabilized  Diagnosis:   ICD-10-CM   1. Moderate mixed bipolar I disorder (HCC)  F31.62     2. Bipolar affective disorder, currently depressed, moderate ([place])  F31.32     3. Generalized  anxiety disorder  F41.1     4. Caregiver stress  Z63.6     5. Relationship problem with family member  Z63.8     40. Mild cognitive impairment  G31.84     7. Sleep apnea with hypersomnolence  G47.10    G47.30     8. History of concussion  Z87.820      Plan:  Mother's care -- Work through in-home help and/or nursing home placement, including Medicaid if needed.  May soon turn out Johnny [name] cannot adequately manage her at home.  Continuing offer to make calls in session if it will help.  Stay clear about mother's  needs and procedures under hospice.   Johnny [name]'s care -- Continue to work with Johnny [name] and the [employer] as appropriate, and practice trust that it is working as stated.  Plan and mentally rehearse constructive responses to Johnny [name] being in the house for holiday while mother is there, too. Family conflict -- Stay clear with Johnny [name] about wishing she would come and about regrettably being unable to foot the bill for student loan.  Be up front, and refrain from spending too much energy imagining how he might get manipulated, just what facts and documentation may back him up with Johnny [name] Support -- Maintain personal support with Johnny [name] and any available friends Sleep disorder -- May need to [name] assess for apnea treatment Other recommendations/advice as may be noted above Continue to utilize previously learned skills ad lib Maintain medication as prescribed and work faithfully with relevant prescriber(s) if any changes are desired or seem indicated Call the clinic on-call service, 988/hotline, 911, or present to [facility] or ER if any life-threatening psychiatric crisis Return for time as already scheduled, avail earlier @ PT's need. Already scheduled visit in this office 08/19/2022.  Johnny Serve, Johnny Johnny [name], Johnny [name] Clinical Psychologist, [facility] [address], [place], [place] [address] [phone]

## 2022-08-19 ENCOUNTER — Ambulatory Visit (INDEPENDENT_AMBULATORY_CARE_PROVIDER_SITE_OTHER): Payer: BC Managed Care – PPO | Admitting: Psychiatry

## 2022-08-19 DIAGNOSIS — Z87820 Personal history of traumatic brain injury: Secondary | ICD-10-CM

## 2022-08-19 DIAGNOSIS — Z634 Disappearance and death of family member: Secondary | ICD-10-CM

## 2022-08-19 DIAGNOSIS — Z636 Dependent relative needing care at home: Secondary | ICD-10-CM | POA: Diagnosis not present

## 2022-08-19 DIAGNOSIS — F3132 Bipolar disorder, current episode depressed, moderate: Secondary | ICD-10-CM

## 2022-08-19 DIAGNOSIS — F411 Generalized anxiety disorder: Secondary | ICD-10-CM | POA: Diagnosis not present

## 2022-08-19 DIAGNOSIS — G4710 Hypersomnia, unspecified: Secondary | ICD-10-CM

## 2022-08-19 DIAGNOSIS — G3184 Mild cognitive impairment, so stated: Secondary | ICD-10-CM

## 2022-08-19 DIAGNOSIS — Z638 Other specified problems related to primary support group: Secondary | ICD-10-CM

## 2022-08-19 DIAGNOSIS — G4730 Sleep apnea, unspecified: Secondary | ICD-10-CM

## 2022-08-19 NOTE — Progress Notes (Addendum)
Psychotherapy Progress Note [name] Johnny Moore, PhD LP  Patient ID: Johnny [name])    MRN: [id number] Therapy format: Family therapy w/ patient -- accompanied by Johnny [name] Date: 08/19/2022      Start: 4:12p     Stop: 5:02p     Time Spent: 50 min Location: In-person   Session narrative (presenting needs, interim history, self-report of stressors and symptoms, applications of prior therapy, status changes, and interventions made in session) Birthday today, 62yo.  Introduces GF Johnny [name], with him for moral support.  Johnny [name] died yesterday morning, having last eaten 10 days before, and very little.  As predicted by Hospice, it was a 2-3 week decline, and she had standard oxycodone and liquid Ativan in the last days.  Spooky experience now of hearing her labored breathing, again last night before he went to sleep, when she was already gone for hours.  Interpreted as "phantom hearing", having been so trained for days and weeks to listen for her need that he can't help but hear what he's looking out for.    Johnny [name] drove in from Johnny [name] last Friday, got to have her goodbye with her GM, and clearly approached it thoughtfully, contrary to Johnny [name]'s more cynical assumptions about his daughter.  Clear also that she came to see her father, and they had some heartfelt, tearful, reassuring time.  Feels he did spend enough time personally tending Johnny [name] during her decline, was able to speak to her blessings, thanks, hope in heaven, and permission to go on.  About 5am, he woke to a sense of a moment at hand, and it turned out she had just breathed her last.  Started kicking himself for not being up 5 minutes earlier, but Hospice nurse wisely counseled that you just can't guarantee being there, and she would have died plenty peacefully even without him at bedside.  Did go through enough agonizing before Johnny [name] died, often praying to ask God when and why not yet.  Now bothered by Johnny [name]'s  hospital bed and accoutrements still in the room, got irritated with the equipment provider for not being able to pick up immediately.  Encouraged be patient with them, it's bound to take a little time, and brainstormed ways to deal with the detritus.  Mainly agreed could rearrange furniture in the room, drape aversive items until pickup, and just ask them when they could make it.  Therapeutic modalities: Cognitive Behavioral Therapy, Solution-Oriented/Positive Psychology, Customer service manager, and Faith-sensitive  Mental Status/Observations:  Appearance:   Casual     Behavior:  Appropriate  Motor:  Normal  Speech/Language:   Clear and Coherent  Affect:  Appropriate  Mood:  sad and relieved  Thought process:  normal  Thought content:    WNL  Sensory/Perceptual disturbances:    WNL  Orientation:  Fully oriented  Attention:  Good    Concentration:  Good  Memory:  WNL  Insight:    Fair  Judgment:   Good  Impulse Control:  Good   Risk Assessment: Danger to Self: No Self-injurious Behavior: No Danger to Others: No Physical Aggression / Violence: No Duty to Warn: No Access to Firearms a concern: No  Assessment of progress:  progressing  Diagnosis:   ICD-10-CM   1. Bipolar affective disorder, currently depressed, moderate ([place])  F31.32     2. Generalized anxiety disorder  F41.1     3. Caregiver stress  Z63.6     4. Relationship problem with family member  Z41.8  5. Mild cognitive impairment  G31.84     6. Sleep apnea with hypersomnolence  G47.10    G47.30     7. History of concussion  Z87.820     8. Bereavement  Z63.4      Plan:  Johnny [name]'s aftercare and bereavement -- Work through tasks of burial and remembrance.  Forgive self not being able to be there for the moment, refresh the promises of faith for himself as he did for her.  Priority to get in touch with medical supply or charitable pickup to remove aversive medical items in the home, be civil, and until then rearrange  or drape. Johnny [name]'s care -- Continue to work with Johnny [name] and the [employer] as appropriate, and practice trust that it is working as stated.  Plan and mentally rehearse constructive responses to Johnny [name] being in the house for holiday while Johnny [name] is there, too. Family conflict -- Stay clear with Johnny [name] about wishing she would come and about regrettably being unable to foot the bill for student loan.  Be up front, and refrain from spending too much energy imagining how he might get manipulated, just what facts and documentation may back him up with Johnny [name] Support -- Maintain personal support with Johnny [name] and any available friends Sleep disorder -- May need to [name] assess for apnea treatment Other recommendations/advice as may be noted above Continue to utilize previously learned skills ad lib Maintain medication as prescribed and work faithfully with relevant prescriber(s) if any changes are desired or seem indicated Call the clinic on-call service, 988/hotline, 911, or present to [facility] or ER if any life-threatening psychiatric crisis Return for as already scheduled, put on [place] list for March. Already scheduled visit in this office 10/06/2022.  Johnny Serve, PhD Johnny Moore, PhD LP Clinical Psychologist, [name] [address], [place], [place] [address] [phone]

## 2022-09-09 ENCOUNTER — Ambulatory Visit (INDEPENDENT_AMBULATORY_CARE_PROVIDER_SITE_OTHER): Payer: BC Managed Care – PPO | Admitting: Psychiatry

## 2022-09-09 DIAGNOSIS — G4710 Hypersomnia, unspecified: Secondary | ICD-10-CM

## 2022-09-09 DIAGNOSIS — Z87820 Personal history of traumatic brain injury: Secondary | ICD-10-CM

## 2022-09-09 DIAGNOSIS — F411 Generalized anxiety disorder: Secondary | ICD-10-CM

## 2022-09-09 DIAGNOSIS — Z638 Other specified problems related to primary support group: Secondary | ICD-10-CM | POA: Diagnosis not present

## 2022-09-09 DIAGNOSIS — F3132 Bipolar disorder, current episode depressed, moderate: Secondary | ICD-10-CM

## 2022-09-09 DIAGNOSIS — Z634 Disappearance and death of family member: Secondary | ICD-10-CM

## 2022-09-09 DIAGNOSIS — G3184 Mild cognitive impairment, so stated: Secondary | ICD-10-CM

## 2022-09-09 DIAGNOSIS — G4730 Sleep apnea, unspecified: Secondary | ICD-10-CM

## 2022-09-09 NOTE — Progress Notes (Signed)
Psychotherapy Progress Note [facility] Luan Moore, PhD LP  Patient ID: Johnny [name])    MRN: [id number] Therapy format: Individual psychotherapy Date: 09/09/2022      Start: 2:05p     Stop: 2:55p     Time Spent: 50 min Location: In-person   Session narrative (presenting needs, interim history, self-report of stressors and symptoms, applications of prior therapy, status changes, and interventions made in session) Per last time, mother Earlie Server had passed, [name] had been present and reassuring of her love and care for him, [name] was close touch and help, and they were trying to address both [name] shocky responses to M's belongings at home and [name]'s own health needs.  Getting some rest now, having dreams of hearing her voice, catching himself compulsively trying to figure out what to shop for her while out, things like that.  Not too spooky, but it does bother him as if it's pathological.  Worked through her cremation, felt shaky receiving her remains.  Assured all reactions are normal, explained conditioned perception to him again using analogy to listening to albums and hearing the next track before it begins.  Did get the medical paraphernalia out, fetched just a day after our last encounter, illustrating that he had already been effective, and it just took what it took organizationally to get there.  Helps to have the scenery of death and morbidity removed.  Also to talk to some people, receive cards, get [place] meals from his church.  Impressed with the generosity of people he doesn't all know.  Good conversation with M's best old friend.  As predicted, [name] sent an email about [employer], which she says has her name on it but is, per divorce settlement, his responsibility.  Has taken a turn at writing her back, knowing not to send it yet, ran it by [facility] for editing, and will transmit despite   Therapeutic modalities: Cognitive Behavioral  Therapy, Solution-Oriented/Positive Psychology, and Ego-Supportive  Mental Status/Observations:  Appearance:   Casual     Behavior:  Appropriate  Motor:  Normal  Speech/Language:   Clear and Coherent  Affect:  Appropriate  Mood:  Irritated  with content, otherwise more relieved and rested  Thought process:  normal  Thought content:    WNL  Sensory/Perceptual disturbances:    WNL  Orientation:  Fully oriented  Attention:  Good    Concentration:  Good  Memory:  grossly intact  Insight:    Fair  Judgment:   Good  Impulse Control:  Good   Risk Assessment: Danger to Self: No Self-injurious Behavior: No Danger to Others: No Physical Aggression / Violence: No Duty to Warn: No Access to Firearms a concern: No  Assessment of progress:  progressing  Diagnosis:   ICD-10-CM   1. Bipolar affective disorder, currently depressed, moderate ([place])  F31.32     2. Generalized anxiety disorder  F41.1     3. Bereavement  Z63.4     4. Relationship problem with family member  Z63.8     5. Mild cognitive impairment  G31.84     6. Sleep apnea with hypersomnolence  G47.10    G47.30     7. History of concussion  Z87.820      Plan:  Mother's aftercare and bereavement -- Work through tasks of burial and remembrance.  Forgive self not being able to be there for the moment, refresh the promises of faith for himself as he did for her.  [name]'s care -- Continue to work with [name] and the [employer] as appropriate, and practice trust that it is working as stated.  Plan and mentally rehearse constructive responses to [name] being in the house for holiday while mother is there, too. Family conflict -- Self-affirm good relationship with [name], and openness about changing monetary support.  OK to check with atty about any forgotten legal requirement to underwrite the loan, but seems to be in order that [name] took the loan out in her own name only, and there is no property settlement reference to [place]  paying it even in her name.  Encourage to keep the moral high ground not erupting at [place], just letting her find out what their contract actually says instead of her wishful thinking. Support -- Maintain personal support with Lucendia Herrlich and any available friends Sleep disorder -- May need to better assess for apnea treatment Other recommendations/advice as may be noted above Continue to utilize previously learned skills ad lib Maintain medication as prescribed and work faithfully with relevant prescriber(s) if any changes are desired or seem indicated Call the clinic on-call service, 988/hotline, 911, or present to [facility] or ER if any life-threatening psychiatric crisis Return for needs reschedule with prescriber, time as already scheduled. Already scheduled visit in this office 10/06/2022.  [name], PhD Luan Moore, PhD LP Clinical Psychologist, [facility] [address], [place], [place] [address] [phone]

## 2022-09-13 NOTE — Progress Notes (Incomplete)
Psychotherapy Progress Note [facility] Luan Moore, PhD LP  Patient ID: BERNHARDT ALZAMORA)    MRN: [id number] Therapy format: Family therapy w/ patient -- accompanied by Lucendia Herrlich Date: 08/19/2022      Start: 4:12p     Stop: 5:02p     Time Spent: 50 min Location: In-person   Session narrative (presenting needs, interim history, self-report of stressors and symptoms, applications of prior therapy, status changes, and interventions made in session) Birthday today, 63yo.  Introduces GF [name], with him for moral support.  M died yesterday morning, having last eaten 10 days before, and very little.  As predicted by Hospice, it was a 2-3 week decline, and she had standard oxycodone and liquid Ativan in the last days.  Spooky experience now of hearing her labored breathing, again last night before he went to sleep, when she was already gone for hours.  Interpreted as "phantom hearing", having been so trained for days and weeks to listen for her need that he can't help but hear what he's looking out for.    [name] drove in from [place] last Friday, got to have her goodbye with her GM, and clearly approached it thoughtfully, contrary to [name] more cynical assumptions about his daughter.  Clear also that she came to see her father, and they had some heartfelt, tearful, reassuring time.  Feels he did spend enough time personally tending mother during her decline, was able to speak to her blessings, thanks, hope in heaven, and permission to go on.  About 5am, he woke to a sense of a moment at hand, and it turned out she had just breathed her last.  Started kicking himself for not being up 5 minutes earlier, but Hospice nurse wisely counseled that you just can't guarantee being there, and she would have died plenty peacefully even without him at bedside.  Did go through enough agonizing before mother died, often praying to ask God when and why not yet.  Now bothered by mother's  hospital bed and accoutrements still in the room, got irritated with the equipment provider for not being able to pick up immediately.  Brainstormed ways to deal with the detritus, mainly, rearranging furniture in the room and draping the unwanted items until pickup, which he hadn't thought of.    Therapeutic modalities: {AM:23362::"Cognitive Behavioral Therapy","Solution-Oriented/Positive Psychology"}  Mental Status/Observations:  Appearance:   {PSY:22683}     Behavior:  {PSY:21022743}  Motor:  {PSY:22302}  Speech/Language:   {PSY:22685}  Affect:  {PSY:22687}  Mood:  {PSY:31886}  Thought process:  {PSY:31888}  Thought content:    {[phone]}  Sensory/Perceptual disturbances:    {PSY:[phone]}  Orientation:  {Psych Orientation:23301::"Fully oriented"}  Attention:  {Good-Fair-Poor ratings:23770::"Good"}    Concentration:  {Good-Fair-Poor ratings:23770::"Good"}  Memory:  {[phone]}  Insight:    {Good-Fair-Poor ratings:23770::"Good"}  Judgment:   {Good-Fair-Poor ratings:23770::"Good"}  Impulse Control:  {Good-Fair-Poor ratings:23770::"Good"}   Risk Assessment: Danger to Self: {Risk:22599::"No"} Self-injurious Behavior: {Risk:22599::"No"} Danger to Others: {Risk:22599::"No"} Physical Aggression / Violence: {Risk:22599::"No"} Duty to Warn: {AMYesNo:22526::"No"} Access to Firearms a concern: {AMYesNo:22526::"No"}  Assessment of progress:  {Progress:22147::"progressing"}  Diagnosis:   ICD-10-CM   1. Bipolar affective disorder, currently depressed, moderate ([place])  F31.32     2. Generalized anxiety disorder  F41.1     3. Caregiver stress  Z63.6     4. Relationship problem with family member  Z63.8     5. Mild cognitive impairment  G31.84     6. Sleep apnea with hypersomnolence  G47.10    G47.30     7. History of concussion  Z87.820     8. Bereavement  Z63.4      Plan:  Mother's aftercare and bereavement -- Work through tasks of burial and remembrance.    [name]'s care -- Continue to work with [name] and the [employer] as appropriate, and practice trust that it is working as stated.  Plan and mentally rehearse constructive responses to [name] being in the house for holiday while mother is there, too. Family conflict -- Stay clear with [name] about wishing she would come and about regrettably being unable to foot the bill for student loan.  Be up front, and refrain from spending too much energy imagining how he might get manipulated, just what facts and documentation may back him up with [name]. Support -- Maintain personal support with Lucendia Herrlich and any available friends Sleep disorder -- May need to better assess for apnea treatment Other recommendations/advice as may be noted above Continue to utilize previously learned skills ad lib Maintain medication as prescribed and work faithfully with relevant prescriber(s) if any changes are desired or seem indicated Call the clinic on-call service, 988/hotline, 911, or present to [facility] or ER if any life-threatening psychiatric crisis Return for as already scheduled, put on [place] list for March. Already scheduled visit in this office 10/06/2022.  [name], PhD Luan Moore, PhD LP Clinical Psychologist, [facility] [address] [place], [place] [address] [phone]

## 2022-09-23 ENCOUNTER — Ambulatory Visit (INDEPENDENT_AMBULATORY_CARE_PROVIDER_SITE_OTHER): Payer: BC Managed Care – PPO | Admitting: Psychiatry

## 2022-09-23 DIAGNOSIS — F1911 Other psychoactive substance abuse, in remission: Secondary | ICD-10-CM

## 2022-09-23 DIAGNOSIS — G4730 Sleep apnea, unspecified: Secondary | ICD-10-CM

## 2022-09-23 DIAGNOSIS — Z638 Other specified problems related to primary support group: Secondary | ICD-10-CM

## 2022-09-23 DIAGNOSIS — F411 Generalized anxiety disorder: Secondary | ICD-10-CM | POA: Diagnosis not present

## 2022-09-23 DIAGNOSIS — F3132 Bipolar disorder, current episode depressed, moderate: Secondary | ICD-10-CM | POA: Diagnosis not present

## 2022-09-23 DIAGNOSIS — Z634 Disappearance and death of family member: Secondary | ICD-10-CM

## 2022-09-23 DIAGNOSIS — Z87820 Personal history of traumatic brain injury: Secondary | ICD-10-CM

## 2022-09-23 DIAGNOSIS — G3184 Mild cognitive impairment, so stated: Secondary | ICD-10-CM

## 2022-09-23 DIAGNOSIS — G4710 Hypersomnia, unspecified: Secondary | ICD-10-CM
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
## 2022-10-06 ENCOUNTER — Ambulatory Visit (INDEPENDENT_AMBULATORY_CARE_PROVIDER_SITE_OTHER): Payer: Self-pay | Admitting: Psychiatry

## 2022-10-06 DIAGNOSIS — Z91199 Patient's noncompliance with other medical treatment and regimen due to unspecified reason: Secondary | ICD-10-CM

## 2022-10-06 NOTE — Progress Notes (Signed)
Admin note for non-service contact  Patient ID: Johnny [name]  MRN: [id number] DATE: 10/06/2022  NS for 2pm session today.  Charge normally, subject to change if extenuating circumstances.  [name], PhD Luan Moore, PhD LP Clinical Psychologist, [facility] [address] [place], [place] [address] [phone]

## 2022-10-08 ENCOUNTER — Ambulatory Visit (INDEPENDENT_AMBULATORY_CARE_PROVIDER_SITE_OTHER): Payer: BC Managed Care – PPO | Admitting: Psychiatry

## 2022-10-08 DIAGNOSIS — G4710 Hypersomnia, unspecified: Secondary | ICD-10-CM

## 2022-10-08 DIAGNOSIS — G3184 Mild cognitive impairment, so stated: Secondary | ICD-10-CM | POA: Diagnosis not present

## 2022-10-08 DIAGNOSIS — F1911 Other psychoactive substance abuse, in remission: Secondary | ICD-10-CM

## 2022-10-08 DIAGNOSIS — G4730 Sleep apnea, unspecified: Secondary | ICD-10-CM

## 2022-10-08 DIAGNOSIS — F3132 Bipolar disorder, current episode depressed, moderate: Secondary | ICD-10-CM | POA: Diagnosis not present

## 2022-10-08 DIAGNOSIS — Z87820 Personal history of traumatic brain injury: Secondary | ICD-10-CM

## 2022-10-08 DIAGNOSIS — F411 Generalized anxiety disorder: Secondary | ICD-10-CM

## 2022-10-08 DIAGNOSIS — Z634 Disappearance and death of family member: Secondary | ICD-10-CM | POA: Diagnosis not present
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
## 2022-10-20 ENCOUNTER — Ambulatory Visit (INDEPENDENT_AMBULATORY_CARE_PROVIDER_SITE_OTHER): Payer: BC Managed Care – PPO | Admitting: Psychiatry

## 2022-10-20 DIAGNOSIS — F1911 Other psychoactive substance abuse, in remission: Secondary | ICD-10-CM

## 2022-10-20 DIAGNOSIS — G3184 Mild cognitive impairment, so stated: Secondary | ICD-10-CM

## 2022-10-20 DIAGNOSIS — Z634 Disappearance and death of family member: Secondary | ICD-10-CM

## 2022-10-20 DIAGNOSIS — G4710 Hypersomnia, unspecified: Secondary | ICD-10-CM

## 2022-10-20 DIAGNOSIS — G4730 Sleep apnea, unspecified: Secondary | ICD-10-CM

## 2022-10-20 DIAGNOSIS — Z62820 Parent-biological child conflict: Secondary | ICD-10-CM

## 2022-10-20 DIAGNOSIS — F3132 Bipolar disorder, current episode depressed, moderate: Secondary | ICD-10-CM | POA: Diagnosis not present

## 2022-10-20 DIAGNOSIS — F411 Generalized anxiety disorder: Secondary | ICD-10-CM

## 2022-10-20 DIAGNOSIS — Z638 Other specified problems related to primary support group: Secondary | ICD-10-CM

## 2022-10-20 DIAGNOSIS — Z87820 Personal history of traumatic brain injury: Secondary | ICD-10-CM
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
## 2022-10-22 ENCOUNTER — Ambulatory Visit (INDEPENDENT_AMBULATORY_CARE_PROVIDER_SITE_OTHER): Payer: BC Managed Care – PPO | Admitting: Psychiatry

## 2022-10-22 ENCOUNTER — Encounter: Payer: Self-pay | Admitting: Psychiatry

## 2022-10-22 DIAGNOSIS — Z87820 Personal history of traumatic brain injury: Secondary | ICD-10-CM

## 2022-10-22 DIAGNOSIS — F3132 Bipolar disorder, current episode depressed, moderate: Secondary | ICD-10-CM | POA: Diagnosis not present

## 2022-10-22 DIAGNOSIS — F411 Generalized anxiety disorder: Secondary | ICD-10-CM | POA: Diagnosis not present

## 2022-10-22 DIAGNOSIS — G3184 Mild cognitive impairment, so stated: Secondary | ICD-10-CM

## 2022-10-22 DIAGNOSIS — F5105 Insomnia due to other mental disorder: Secondary | ICD-10-CM

## 2022-10-22 DIAGNOSIS — G4710 Hypersomnia, unspecified: Secondary | ICD-10-CM

## 2022-10-22 DIAGNOSIS — G4730 Sleep apnea, unspecified: Secondary | ICD-10-CM

## 2022-10-22 MED ORDER — MIRTAZAPINE 15 MG PO TABS
15.0000 mg | ORAL_TABLET | Freq: Every day | ORAL | 0 refills | Status: DC
Start: 2022-10-22 — End: 2023-04-01

## 2022-10-22 NOTE — Progress Notes (Signed)
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 2022/09/05.  And how this will change his life.  Can't medicate grief.   FU 2 mos for support  Meredith Staggers MD, DFAPA  Please see After Visit Summary for patient specific instructions.  Future Appointments  Date Time Provider Department Center  11/03/2022  4:00 PM Robley Fries, PhD CP-CP None  11/17/2022  3:00 PM Robley Fries, PhD CP-CP None  12/02/2022  3:00 PM Robley Fries, PhD CP-CP None  12/15/2022  4:00 PM Robley Fries, PhD CP-CP None  12/29/2022  3:00 PM Robley Fries, PhD CP-CP None  01/21/2023  1:00 PM Robley Fries, PhD CP-CP None    No orders of the defined types were placed in this encounter.      -------------------------------

## 2022-11-03 ENCOUNTER — Ambulatory Visit: Payer: BC Managed Care – PPO | Admitting: Psychiatry

## 2022-11-09 ENCOUNTER — Other Ambulatory Visit: Payer: Self-pay | Admitting: Psychiatry

## 2022-11-09 DIAGNOSIS — F411 Generalized anxiety disorder: Secondary | ICD-10-CM

## 2022-11-09 DIAGNOSIS — F5105 Insomnia due to other mental disorder: Secondary | ICD-10-CM

## 2022-11-09 DIAGNOSIS — G4710 Hypersomnia, unspecified: Secondary | ICD-10-CM

## 2022-11-09 DIAGNOSIS — F3162 Bipolar disorder, current episode mixed, moderate: Secondary | ICD-10-CM

## 2022-11-09 DIAGNOSIS — N521 Erectile dysfunction due to diseases classified elsewhere: Secondary | ICD-10-CM

## 2022-11-17 ENCOUNTER — Ambulatory Visit (INDEPENDENT_AMBULATORY_CARE_PROVIDER_SITE_OTHER): Payer: BC Managed Care – PPO | Admitting: Psychiatry

## 2022-11-17 DIAGNOSIS — Z87820 Personal history of traumatic brain injury: Secondary | ICD-10-CM

## 2022-11-17 DIAGNOSIS — G4710 Hypersomnia, unspecified: Secondary | ICD-10-CM

## 2022-11-17 DIAGNOSIS — F3132 Bipolar disorder, current episode depressed, moderate: Secondary | ICD-10-CM

## 2022-11-17 DIAGNOSIS — F1911 Other psychoactive substance abuse, in remission: Secondary | ICD-10-CM

## 2022-11-17 DIAGNOSIS — G4730 Sleep apnea, unspecified: Secondary | ICD-10-CM

## 2022-11-17 DIAGNOSIS — G3184 Mild cognitive impairment, so stated: Secondary | ICD-10-CM

## 2022-11-17 DIAGNOSIS — Z634 Disappearance and death of family member: Secondary | ICD-10-CM

## 2022-11-17 DIAGNOSIS — F411 Generalized anxiety disorder: Secondary | ICD-10-CM

## 2022-11-17 NOTE — Progress Notes (Signed)
Psychotherapy Progress Note [facility] [name], PhD LP  Patient ID: Johnny [name])    MRN: [id number] Therapy format: Individual psychotherapy Date: 11/17/2022      Start: 3:12p     Stop: 4:02p     Time Spent: 50 min Location: In-person   Session narrative (presenting needs, interim history, self-report of stressors and symptoms, applications of prior therapy, status changes, and interventions made in session) 1st Mother's Day very evocative, very sad.  Couldn't stay at the house, Friday retreated to [name]'s through the weekend.  Unexpectedly this weekend went engagement ring shopping, with soft target of Sep/Oct wedding.  Spontaneous decision to buy one that stood out, at a pawn shop, turns out to have been an excellent bargain, valued at about 20x what he paid.  Seeking a single-level house for himself and [name], too, being tired of his house and its 17 steep steps, changing neighborhood, neighbor issue, and the haunting of having had mom live and die there.  With his health (sciatica) and hers (liver failure), also looking to go ahead and set up for a happy life together.  Home is mostly cleared, but still has some medical paraphernalia around, like a wheelchair, that needs to go.  Refrigerator went out, and he's been making do with store-bought ice bags.  Still motivated to relocate, when able, as this home has been inconvenient for work, a neighborhood changing against his wishes, and the place where he has been through a variety of pain with family members.  Tends to fault himself for "losing it" and "not being together" when the tears come.  Supportively confronted it as no weakness whatsoever, just love in grief, finding needed expression.  [name] is holding a celebration of life for her son Will on June 2, inviting [name].  Likely [name] will be invited, too.  Probed whether there are any lingering needs for justice or resentments with [name], as prior  disclosures have suggested -- believes not at this point, really is emotionally free to relate as exes and co-parents and otherwise move on with [name].  Therapeutic modalities: Cognitive Behavioral Therapy, Solution-Oriented/Positive Psychology, Environmental manager, and Faith-sensitive  Mental Status/Observations:  Appearance:   Casual     Behavior:  Appropriate  Motor:  Normal  Speech/Language:   Clear and Coherent  Affect:  Appropriate  Mood:  sad  Thought process:  normal  Thought content:    WNL  Sensory/Perceptual disturbances:    WNL  Orientation:  Fully oriented  Attention:  Good    Concentration:  Good  Memory:  grossly intact  Insight:    Good  Judgment:   Good  Impulse Control:  Good   Risk Assessment: Danger to Self: No Self-injurious Behavior: No Danger to Others: No Physical Aggression / Violence: No Duty to Warn: No Access to Firearms a concern: No  Assessment of progress:  progressing  Diagnosis:   ICD-10-CM   1. Bipolar affective disorder, currently depressed, moderate (HCC)  F31.32     2. Bereavement  Z63.4     3. Generalized anxiety disorder  F41.1     4. Mild cognitive impairment  G31.84     5. History of concussion  Z87.820     6. Sleep apnea with hypersomnolence  G47.10    G47.30     7. History of substance abuse (HCC)  F19.11      Plan:  Mother's aftercare and bereavement -- Work through estate tasks.  Forgive self not  being able to be there for the moment of death, and practice allowing the spot where she died as the place where he actually did love to the last breath.  Make free use of talking to her imaginally if it will get things off his head.  Refresh the promises of faith for himself, as he did for her in life.  Griefshare program available if regains interest. [name]'s care -- Continue to work with [name] and the [employer] as appropriate, and stay open to positive developments.  Challenge to keep open mind about [name]'s [name], but OK to ask  them to consider drawbacks of lavish gift-giving and other issues as they arise. Family conflict -- Self-affirm good relationship with [name].  Continue retiring past concerns with [name].  Where indicated, practice being able to show up with them without having to suspect conflict. Support -- Maintain personal support with [name] and any available friends Sleep disorder -- May need to better assess apnea, treatment Other recommendations/advice as may be noted above Continue to utilize previously learned skills ad lib Maintain medication as prescribed and work faithfully with relevant prescriber(s) if any changes are desired or seem indicated Call the clinic on-call service, 988/hotline, 911, or present to [facility] or ER if any life-threatening psychiatric crisis Return for time as already scheduled. Already scheduled visit in this office 12/02/2022.  Robley Fries, PhD [name], PhD LP Clinical Psychologist, [facility] [address] [place], [place] [address] [phone]

## 2022-11-19 ENCOUNTER — Other Ambulatory Visit: Payer: Self-pay | Admitting: Psychiatry

## 2022-11-19 DIAGNOSIS — F411 Generalized anxiety disorder: Secondary | ICD-10-CM

## 2022-12-02 ENCOUNTER — Ambulatory Visit (INDEPENDENT_AMBULATORY_CARE_PROVIDER_SITE_OTHER): Payer: BC Managed Care – PPO | Admitting: Psychiatry

## 2022-12-02 DIAGNOSIS — F1911 Other psychoactive substance abuse, in remission: Secondary | ICD-10-CM

## 2022-12-02 DIAGNOSIS — G4730 Sleep apnea, unspecified: Secondary | ICD-10-CM

## 2022-12-02 DIAGNOSIS — F3132 Bipolar disorder, current episode depressed, moderate: Secondary | ICD-10-CM

## 2022-12-02 DIAGNOSIS — Z62820 Parent-biological child conflict: Secondary | ICD-10-CM

## 2022-12-02 DIAGNOSIS — F411 Generalized anxiety disorder: Secondary | ICD-10-CM | POA: Diagnosis not present

## 2022-12-02 DIAGNOSIS — G4710 Hypersomnia, unspecified: Secondary | ICD-10-CM

## 2022-12-02 DIAGNOSIS — G3184 Mild cognitive impairment, so stated: Secondary | ICD-10-CM

## 2022-12-02 DIAGNOSIS — Z634 Disappearance and death of family member: Secondary | ICD-10-CM | POA: Diagnosis not present

## 2022-12-02 DIAGNOSIS — Z87820 Personal history of traumatic brain injury: Secondary | ICD-10-CM

## 2022-12-02 DIAGNOSIS — F5105 Insomnia due to other mental disorder: Secondary | ICD-10-CM
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
## 2022-12-08 ENCOUNTER — Telehealth: Payer: Self-pay | Admitting: Psychiatry

## 2022-12-08 NOTE — Telephone Encounter (Signed)
Johnny [name] called at 1:55 to report that he had a bad fall yesterday and sustained a head injury and is being treated by his friend but has pain and tylenol is not helping with the pain.  He wants to know if there is a pain medication he can take that won't interfere with his other medication.  Please advise.  If you are willing to prescribe the medication sent to  [facility] [phone] - [place], [place] - [address]

## 2022-12-08 NOTE — Telephone Encounter (Signed)
Patient told we don't prescribe pain meds. He said his PCP would not prescribe anything, though he just called, has not been seen. Recommended he be seen by PCP or urgent care.

## 2022-12-09 ENCOUNTER — Emergency Department (HOSPITAL_BASED_OUTPATIENT_CLINIC_OR_DEPARTMENT_OTHER)
Admission: EM | Admit: 2022-12-09 | Discharge: 2022-12-09 | Disposition: A | Discharge status: Home / Self Care | Payer: BC Managed Care – PPO | Attending: Emergency Medicine | Admitting: Emergency Medicine

## 2022-12-09 ENCOUNTER — Emergency Department (HOSPITAL_BASED_OUTPATIENT_CLINIC_OR_DEPARTMENT_OTHER): Payer: BC Managed Care – PPO | Admitting: Radiology

## 2022-12-09 ENCOUNTER — Other Ambulatory Visit: Payer: Self-pay

## 2022-12-09 ENCOUNTER — Emergency Department (HOSPITAL_BASED_OUTPATIENT_CLINIC_OR_DEPARTMENT_OTHER): Payer: BC Managed Care – PPO

## 2022-12-09 ENCOUNTER — Encounter (HOSPITAL_BASED_OUTPATIENT_CLINIC_OR_DEPARTMENT_OTHER): Payer: Self-pay

## 2022-12-09 DIAGNOSIS — S80212A Abrasion, left knee, initial encounter: Secondary | ICD-10-CM | POA: Insufficient documentation

## 2022-12-09 DIAGNOSIS — R079 Chest pain, unspecified: Secondary | ICD-10-CM | POA: Diagnosis not present

## 2022-12-09 DIAGNOSIS — S01112A Laceration without foreign body of left eyelid and periocular area, initial encounter: Secondary | ICD-10-CM | POA: Diagnosis not present

## 2022-12-09 DIAGNOSIS — E041 Nontoxic single thyroid nodule: Secondary | ICD-10-CM | POA: Diagnosis not present

## 2022-12-09 DIAGNOSIS — S50319A Abrasion of unspecified elbow, initial encounter: Secondary | ICD-10-CM | POA: Insufficient documentation

## 2022-12-09 DIAGNOSIS — S60512A Abrasion of left hand, initial encounter: Secondary | ICD-10-CM | POA: Insufficient documentation

## 2022-12-09 DIAGNOSIS — M25561 Pain in right knee: Secondary | ICD-10-CM | POA: Diagnosis not present

## 2022-12-09 DIAGNOSIS — G319 Degenerative disease of nervous system, unspecified: Secondary | ICD-10-CM | POA: Diagnosis not present

## 2022-12-09 DIAGNOSIS — M25562 Pain in left knee: Secondary | ICD-10-CM | POA: Diagnosis not present

## 2022-12-09 DIAGNOSIS — W010XXA Fall on same level from slipping, tripping and stumbling without subsequent striking against object, initial encounter: Secondary | ICD-10-CM | POA: Diagnosis not present

## 2022-12-09 DIAGNOSIS — Z043 Encounter for examination and observation following other accident: Secondary | ICD-10-CM | POA: Diagnosis not present

## 2022-12-09 DIAGNOSIS — S80211A Abrasion, right knee, initial encounter: Secondary | ICD-10-CM | POA: Diagnosis not present

## 2022-12-09 DIAGNOSIS — S60511A Abrasion of right hand, initial encounter: Secondary | ICD-10-CM | POA: Insufficient documentation

## 2022-12-09 DIAGNOSIS — W19XXXA Unspecified fall, initial encounter: Secondary | ICD-10-CM

## 2022-12-09 DIAGNOSIS — T07XXXA Unspecified multiple injuries, initial encounter: Secondary | ICD-10-CM

## 2022-12-09 DIAGNOSIS — S0993XA Unspecified injury of face, initial encounter: Secondary | ICD-10-CM | POA: Diagnosis not present

## 2022-12-09 MED ORDER — METOCLOPRAMIDE HCL 10 MG PO TABS
10.0000 mg | ORAL_TABLET | Freq: Once | ORAL | Status: AC
  Administered 2022-12-09: 10 mg via ORAL
  Filled 2022-12-09: qty 1

## 2022-12-09 MED ORDER — METOCLOPRAMIDE HCL 10 MG PO TABS: 10.0000 mg | ORAL_TABLET | Freq: Four times a day (QID) | ORAL | 0 refills | Status: DC

## 2022-12-09 MED ORDER — ACETAMINOPHEN 500 MG PO TABS
1000.0000 mg | ORAL_TABLET | Freq: Once | ORAL | Status: AC
  Administered 2022-12-09: 1000 mg via ORAL
  Filled 2022-12-09: qty 2

## 2022-12-09 NOTE — ED Provider Notes (Incomplete)
[place] EMERGENCY DEPARTMENT AT [facility] Provider Note   CSN: [id number] Arrival date & time: 12/09/22  1927     History {Add pertinent medical, surgical, social history, OB history to HPI:1} Chief Complaint  Patient presents with  . Fall    Johnny [name] is a 62 y.o. male.   Patient is a 62 year old male present emergency room today after a fall that occurred Sunday he stopped his toe and fell forward to the ground onto his left side of his face.  He has had a headache since then he did not lose consciousness or experience any vomiting but has had some nausea since.  He states he        Home Medications Prior to Admission medications   Medication Sig Start Date End Date Taking? Authorizing Provider  cloNIDine (CATAPRES) 0.1 MG tablet TAKE ONE TABLET BY MOUTH EVERY MORNING, TAKE ONE TABLET BY MOUTH AT NOON, AND TAKE TWO TABLETS BY MOUTH EVERY NIGHT AT BEDTIME 07/10/22   Cottle, Steva Ready., MD  lamoTRIgine (LAMICTAL) 100 MG tablet TAKE ONE TABLET BY MOUTH THREE TIMES A DAY AND TAKE TWO TABLETS BY MOUTH EVERY NIGHT AT BEDTIME 07/10/22   Cottle, Steva Ready., MD  LORazepam (ATIVAN) 1 MG tablet TAKE 1 TABLET BY MOUTH EVERY 8 HOURS AS NEEDED 11/19/22   Cottle, Steva Ready., MD  mirtazapine (REMERON) 15 MG tablet Take 1 tablet (15 mg total) by mouth at bedtime. 10/22/22   Cottle, Steva Ready., MD  Multiple Vitamin (MULTIVITAMIN WITH MINERALS) TABS tablet Take 1 tablet by mouth daily.    [provider]  Oxcarbazepine (TRILEPTAL) 300 MG tablet 2 tablets in the AM and 4 tablets at night 07/10/22   Cottle, Steva Ready., MD  QUEtiapine (SEROQUEL) 300 MG tablet Take 1 tablet (300 mg total) by mouth at bedtime. 07/10/22   Cottle, Steva Ready., MD  sildenafil (VIAGRA) 100 MG tablet TAKE ONE TABLET BY MOUTH DAILY AS NEEDED FOR ERECTILE DYSFUNCTION 11/10/22   Cottle, Steva Ready., MD      Allergies    Augmentin [amoxicillin-pot clavulanate], Lithium, and Topamax [topiramate]    Review  of Systems   Review of Systems  Physical Exam Updated Vital Signs BP (!) 144/104   Pulse 67   Temp 97.7 F (36.5 C) (Temporal)   Resp 18   Ht 5\' 10"  (1.778 m)   Wt 119.7 kg   SpO2 100%   BMI 37.88 kg/m  Physical Exam  ED Results / Procedures / Treatments   Labs (all labs ordered are listed, but only abnormal results are displayed) Labs Reviewed - No data to display  EKG None  Radiology CT Cervical Spine Wo Contrast  Result Date: 12/09/2022 CLINICAL DATA:  Status post fall. EXAM: CT CERVICAL SPINE WITHOUT CONTRAST TECHNIQUE: Multidetector CT imaging of the cervical spine was performed without intravenous contrast. Multiplanar CT image reconstructions were also generated. RADIATION DOSE REDUCTION: This exam was performed according to the departmental dose-optimization program which includes automated exposure control, adjustment of the mA and/or kV according to patient size and/or use of iterative reconstruction technique. COMPARISON:  None Available. FINDINGS: Alignment: Normal. Skull base and vertebrae: No acute fracture. No primary bone lesion or focal pathologic process. Soft tissues and spinal canal: No prevertebral fluid or swelling. No visible canal hematoma. Disc levels: Mild endplate sclerosis, anterior osteophyte formation and posterior bony spurring are seen at the levels of C2-C3, C3-C4, C4-C5, C5-C6 and C6-C7. Marked severity intervertebral  disc space narrowing is seen at C6-C7, with mild to moderate severity intervertebral disc space narrowing noted throughout the remainder of the cervical spine. Bilateral mild-to-moderate severity multilevel facet joint hypertrophy is noted. Upper chest: Negative. Other: The left lobe of the thyroid gland is enlarged and heterogeneous in appearance. IMPRESSION: 1. No acute fracture or subluxation in the cervical spine. 2. Moderate to marked severity multilevel degenerative changes, most prominent at the C6-C7 level. 3. Enlarged and  heterogeneous left lobe of the thyroid gland. Further evaluation with nonemergent thyroid ultrasound is recommended. This follows ACR consensus guidelines: Managing Incidental Thyroid Nodules Detected on Imaging: White Paper of the ACR Incidental Thyroid Findings Committee. J Am Coll Radiol 2015; 12:143-150. Electronically Signed   By: Aram Candela M.D.   On: 12/09/2022 21:30   CT Head Wo Contrast  Result Date: 12/09/2022 CLINICAL DATA:  Status post fall. EXAM: CT HEAD WITHOUT CONTRAST TECHNIQUE: Contiguous axial images were obtained from the base of the skull through the vertex without intravenous contrast. RADIATION DOSE REDUCTION: This exam was performed according to the departmental dose-optimization program which includes automated exposure control, adjustment of the mA and/or kV according to patient size and/or use of iterative reconstruction technique. COMPARISON:  June 26, 2016 FINDINGS: Brain: There is mild cerebral atrophy with widening of the extra-axial spaces and ventricular dilatation. There are areas of decreased attenuation within the white matter tracts of the supratentorial brain, consistent with microvascular disease changes. Vascular: No hyperdense vessel or unexpected calcification. Skull: Normal. Negative for fracture or focal lesion. Sinuses/Orbits: No acute finding. Other: None. IMPRESSION: 1. No acute intracranial abnormality. 2. Generalized cerebral atrophy and microvascular disease changes of the supratentorial brain. Electronically Signed   By: Aram Candela M.D.   On: 12/09/2022 21:27   DG Chest Portable 1 View  Result Date: 12/09/2022 CLINICAL DATA:  Recent fall with chest pain, initial encounter EXAM: PORTABLE CHEST 1 VIEW COMPARISON:  06/26/2016 FINDINGS: The heart size and mediastinal contours are within normal limits. Both lungs are clear. The visualized skeletal structures are unremarkable. IMPRESSION: No active disease. Electronically Signed   By: Alcide Clever  M.D.   On: 12/09/2022 21:19   DG Knee Complete 4 Views Left  Result Date: 12/09/2022 CLINICAL DATA:  Recent fall with knee pain, initial encounter EXAM: LEFT KNEE - COMPLETE 4+ VIEW COMPARISON:  None Available. FINDINGS: Tricompartmental degenerative changes are noted worst in the medial joint space. No joint effusion is seen. No acute fracture or dislocation is noted. IMPRESSION: Degenerative change without acute abnormality. Electronically Signed   By: Alcide Clever M.D.   On: 12/09/2022 21:18   DG Knee Complete 4 Views Right  Result Date: 12/09/2022 CLINICAL DATA:  Recent fall with knee pain, initial encounter EXAM: RIGHT KNEE - COMPLETE 4+ VIEW COMPARISON:  01/15/16 FINDINGS: Right knee prosthesis is noted in satisfactory position. No acute fracture or dislocation is noted. No joint effusion is seen. No soft tissue changes are noted. IMPRESSION: No acute abnormality noted. Electronically Signed   By: Alcide Clever M.D.   On: 12/09/2022 21:18    Procedures Procedures  {Document cardiac monitor, telemetry assessment procedure when appropriate:1}  Medications Ordered in ED Medications  metoCLOPramide (REGLAN) tablet 10 mg (10 mg Oral Given 12/09/22 2051)  acetaminophen (TYLENOL) tablet 1,000 mg (1,000 mg Oral Given 12/09/22 2051)    ED Course/ Medical Decision Making/ A&P Clinical Course as of 12/09/22 2302  Tue Dec 09, 2022  2035 Sunday evening stubbed his toe and fell forward  to the ground. Onto L face.  Headache is primary CC.   [WF]  2130 X-rays without fracture or dislocation of patella for bilateral knees, chest x-ray unremarkable [WF]    Clinical Course User Index [WF] Gailen Shelter, PA   {   Click here for ABCD2, HEART and other calculatorsREFRESH Note before signing :1}                          Medical Decision Making Amount and/or Complexity of Data Reviewed Radiology: ordered.  Risk OTC drugs. Prescription drug management.   ***  {Document critical care time when  appropriate:1} {Document review of labs and clinical decision tools ie heart score, Chads2Vasc2 etc:1}  {Document your independent review of radiology images, and any outside records:1} {Document your discussion with family members, caretakers, and with consultants:1} {Document social determinants of health affecting pt's care:1} {Document your decision making why or why not admission, treatments were needed:1} Final Clinical Impression(s) / ED Diagnoses Final diagnoses:  Fall, initial encounter  Abrasions of multiple sites  Thyroid nodule    Rx / DC Orders ED Discharge Orders     None

## 2022-12-09 NOTE — ED Triage Notes (Signed)
Patient here POV from Home.  Endorses tripping over the sidewalk and fell onto the sidewalk. This occurred Sunday. Pain to Bilateral Knee. Healing Left Eyebrow Laceration. No LOC. No Anticoagulants.   NAD noted during Triage. A&Ox4. GCS 15. BIB Wheelchair.

## 2022-12-09 NOTE — ED Notes (Signed)
DC papers reviewed. No questions or concerns. No signs of distress. Pt assisted to wheelchair and out to lobby. Appropriate measures for safety taken. 

## 2022-12-09 NOTE — Discharge Instructions (Addendum)
Follow-up with your primary care doctor regarding thyroid nodule.  Your workup today has been reassuring.

## 2022-12-09 NOTE — ED Provider Notes (Cosign Needed Addendum)
[place] EMERGENCY DEPARTMENT AT [facility] Provider Note   CSN: [id number] Arrival date & time: 12/09/22  1927     History  Chief Complaint  Patient presents with   Johnny [name] is a 62 y.o. male.   Patient is a 62 year old male present emergency room today after a fall that occurred Sunday he stopped his toe and fell forward to the ground onto his left side of his face.  He has had a headache since then he did not lose consciousness or experience any vomiting but has had some nausea since.  He states he also has some neck pain and pain in both knees.  No chest pain or difficulty breathing no fevers chills or vomiting.      Home Medications Prior to Admission medications   Medication Sig Start Date End Date Taking? Authorizing Provider  cloNIDine (CATAPRES) 0.1 MG tablet TAKE ONE TABLET BY MOUTH EVERY MORNING, TAKE ONE TABLET BY MOUTH AT NOON, AND TAKE TWO TABLETS BY MOUTH EVERY NIGHT AT BEDTIME 07/10/22   Cottle, Steva Ready., MD  lamoTRIgine (LAMICTAL) 100 MG tablet TAKE ONE TABLET BY MOUTH THREE TIMES A DAY AND TAKE TWO TABLETS BY MOUTH EVERY NIGHT AT BEDTIME 07/10/22   Cottle, Steva Ready., MD  LORazepam (ATIVAN) 1 MG tablet TAKE 1 TABLET BY MOUTH EVERY 8 HOURS AS NEEDED 11/19/22   Cottle, Steva Ready., MD  mirtazapine (REMERON) 15 MG tablet Take 1 tablet (15 mg total) by mouth at bedtime. 10/22/22   Cottle, Steva Ready., MD  Multiple Vitamin (MULTIVITAMIN WITH MINERALS) TABS tablet Take 1 tablet by mouth daily.    [provider]  Oxcarbazepine (TRILEPTAL) 300 MG tablet 2 tablets in the AM and 4 tablets at night 07/10/22   Cottle, Steva Ready., MD  QUEtiapine (SEROQUEL) 300 MG tablet Take 1 tablet (300 mg total) by mouth at bedtime. 07/10/22   Cottle, Steva Ready., MD  sildenafil (VIAGRA) 100 MG tablet TAKE ONE TABLET BY MOUTH DAILY AS NEEDED FOR ERECTILE DYSFUNCTION 11/10/22   Cottle, Steva Ready., MD      Allergies    Augmentin [amoxicillin-pot clavulanate],  Lithium, and Topamax [topiramate]    Review of Systems   Review of Systems  Physical Exam Updated Vital Signs BP (!) 144/104   Pulse 67   Temp 97.7 F (36.5 C) (Temporal)   Resp 18   Ht 5\' 10"  (1.778 m)   Wt 119.7 kg   SpO2 100%   BMI 37.88 kg/m  Physical Exam Vitals and nursing note reviewed.  Constitutional:      General: He is not in acute distress.    Appearance: He is obese.     Comments: Pleasant well-appearing 62 year old.  In no acute distress.  Sitting comfortably in bed.  Able answer questions appropriately follow commands. No increased work of breathing. Speaking in full sentences.   HENT:     Head: Normocephalic and atraumatic.     Nose: Nose normal.  Eyes:     General: No scleral icterus. Cardiovascular:     Rate and Rhythm: Normal rate and regular rhythm.     Pulses: Normal pulses.     Heart sounds: Normal heart sounds.  Pulmonary:     Effort: Pulmonary effort is normal. No respiratory distress.     Breath sounds: No wheezing.  Abdominal:     Palpations: Abdomen is soft.     Tenderness: There is no abdominal tenderness.  Musculoskeletal:     Cervical back: Normal range of motion.     Right lower leg: No edema.     Left lower leg: No edema.     Comments: No bony tenderness over joints or long bones of the upper and lower extremities.  There is some tenderness over the abrasions of the knees but no bony tenderness.  No neck or back midline tenderness, step-off, deformity, or bruising. Able to turn head left and right 45 degrees without difficulty.  Full range of motion of upper and lower extremity joints shown after palpation was conducted; with 5/5 symmetrical strength in upper and lower extremities. No chest wall tenderness, no facial or cranial tenderness.   Patient has intact sensation grossly in lower and upper extremities. Intact patellar and ankle reflexes. Patient able to ambulate without difficulty.  Radial and DP pulses palpated BL.    Skin:     General: Skin is warm and dry.     Capillary Refill: Capillary refill takes less than 2 seconds.     Comments: Abrasions over bilateral knees, there is a small nongaping healing laceration over the left eyebrow  Some scattered abrasions over elbow and hands  Neurological:     Mental Status: He is alert. Mental status is at baseline.  Psychiatric:        Mood and Affect: Mood normal.        Behavior: Behavior normal.     ED Results / Procedures / Treatments   Labs (all labs ordered are listed, but only abnormal results are displayed) Labs Reviewed - No data to display  EKG None  Radiology CT Cervical Spine Wo Contrast  Result Date: 12/09/2022 CLINICAL DATA:  Status post fall. EXAM: CT CERVICAL SPINE WITHOUT CONTRAST TECHNIQUE: Multidetector CT imaging of the cervical spine was performed without intravenous contrast. Multiplanar CT image reconstructions were also generated. RADIATION DOSE REDUCTION: This exam was performed according to the departmental dose-optimization program which includes automated exposure control, adjustment of the mA and/or kV according to patient size and/or use of iterative reconstruction technique. COMPARISON:  None Available. FINDINGS: Alignment: Normal. Skull base and vertebrae: No acute fracture. No primary bone lesion or focal pathologic process. Soft tissues and spinal canal: No prevertebral fluid or swelling. No visible canal hematoma. Disc levels: Mild endplate sclerosis, anterior osteophyte formation and posterior bony spurring are seen at the levels of C2-C3, C3-C4, C4-C5, C5-C6 and C6-C7. Marked severity intervertebral disc space narrowing is seen at C6-C7, with mild to moderate severity intervertebral disc space narrowing noted throughout the remainder of the cervical spine. Bilateral mild-to-moderate severity multilevel facet joint hypertrophy is noted. Upper chest: Negative. Other: The left lobe of the thyroid gland is enlarged and heterogeneous in  appearance. IMPRESSION: 1. No acute fracture or subluxation in the cervical spine. 2. Moderate to marked severity multilevel degenerative changes, most prominent at the C6-C7 level. 3. Enlarged and heterogeneous left lobe of the thyroid gland. Further evaluation with nonemergent thyroid ultrasound is recommended. This follows ACR consensus guidelines: Managing Incidental Thyroid Nodules Detected on Imaging: White Paper of the ACR Incidental Thyroid Findings Committee. J Am Coll Radiol 2015; 12:143-150. Electronically Signed   By: Aram Candela M.D.   On: 12/09/2022 21:30   CT Head Wo Contrast  Result Date: 12/09/2022 CLINICAL DATA:  Status post fall. EXAM: CT HEAD WITHOUT CONTRAST TECHNIQUE: Contiguous axial images were obtained from the base of the skull through the vertex without intravenous contrast. RADIATION DOSE REDUCTION: This exam was performed according  to the departmental dose-optimization program which includes automated exposure control, adjustment of the mA and/or kV according to patient size and/or use of iterative reconstruction technique. COMPARISON:  June 26, 2016 FINDINGS: Brain: There is mild cerebral atrophy with widening of the extra-axial spaces and ventricular dilatation. There are areas of decreased attenuation within the white matter tracts of the supratentorial brain, consistent with microvascular disease changes. Vascular: No hyperdense vessel or unexpected calcification. Skull: Normal. Negative for fracture or focal lesion. Sinuses/Orbits: No acute finding. Other: None. IMPRESSION: 1. No acute intracranial abnormality. 2. Generalized cerebral atrophy and microvascular disease changes of the supratentorial brain. Electronically Signed   By: Aram Candela M.D.   On: 12/09/2022 21:27   DG Chest Portable 1 View  Result Date: 12/09/2022 CLINICAL DATA:  Recent fall with chest pain, initial encounter EXAM: PORTABLE CHEST 1 VIEW COMPARISON:  06/26/2016 FINDINGS: The heart size  and mediastinal contours are within normal limits. Both lungs are clear. The visualized skeletal structures are unremarkable. IMPRESSION: No active disease. Electronically Signed   By: Alcide Clever M.D.   On: 12/09/2022 21:19   DG Knee Complete 4 Views Left  Result Date: 12/09/2022 CLINICAL DATA:  Recent fall with knee pain, initial encounter EXAM: LEFT KNEE - COMPLETE 4+ VIEW COMPARISON:  None Available. FINDINGS: Tricompartmental degenerative changes are noted worst in the medial joint space. No joint effusion is seen. No acute fracture or dislocation is noted. IMPRESSION: Degenerative change without acute abnormality. Electronically Signed   By: Alcide Clever M.D.   On: 12/09/2022 21:18   DG Knee Complete 4 Views Right  Result Date: 12/09/2022 CLINICAL DATA:  Recent fall with knee pain, initial encounter EXAM: RIGHT KNEE - COMPLETE 4+ VIEW COMPARISON:  01/15/16 FINDINGS: Right knee prosthesis is noted in satisfactory position. No acute fracture or dislocation is noted. No joint effusion is seen. No soft tissue changes are noted. IMPRESSION: No acute abnormality noted. Electronically Signed   By: Alcide Clever M.D.   On: 12/09/2022 21:18    Procedures Procedures    Medications Ordered in ED Medications  metoCLOPramide (REGLAN) tablet 10 mg (10 mg Oral Given 12/09/22 2051)  acetaminophen (TYLENOL) tablet 1,000 mg (1,000 mg Oral Given 12/09/22 2051)    ED Course/ Medical Decision Making/ A&P Clinical Course as of 12/09/22 2302  Tue Dec 09, 2022  2035 Sunday evening stubbed his toe and fell forward to the ground. Onto L face.  Headache is primary CC.   [WF]  2130 X-rays without fracture or dislocation of patella for bilateral knees, chest x-ray unremarkable [WF]    Clinical Course User Index [WF] Gailen Shelter, PA                             Medical Decision Making Amount and/or Complexity of Data Reviewed Radiology: ordered.  Risk OTC drugs. Prescription drug management.   Patient  is a 62 year old male present emergency room today after a fall that occurred Sunday he stopped his toe and fell forward to the ground onto his left side of his face.  He has had a headache since then he did not lose consciousness or experience any vomiting but has had some nausea since.  He states he also has some neck pain and pain in both knees.  No chest pain or difficulty breathing no fevers chills or vomiting.    Patient with mechanical fall and physical exam consistent with simply abrasions however given knee  pain and head injury and neck pain will obtain CT imaging of head and neck and x-rays of knees, chest x-ray also obtained at his request  I personally viewed CT head and C-spine agree radiology read no acute abnormal findings.  He does have some chronic changes on CT head which I discussed with him.  X-rays without fractures  Will discharge home return precautions discussed.  I also discussed the incidental thyroid nodule found on CT imaging of C-spine.  He will follow-up with PCP regarding this  Final Clinical Impression(s) / ED Diagnoses Final diagnoses:  Fall, initial encounter  Abrasions of multiple sites  Thyroid nodule    Rx / DC Orders ED Discharge Orders     None         Gailen Shelter, [place] 12/10/22 1704    Gailen Shelter, [place] 12/10/22 1705    Ernie Avena, MD 12/11/22 1925

## 2022-12-10 DIAGNOSIS — D225 Melanocytic nevi of trunk: Secondary | ICD-10-CM | POA: Diagnosis not present

## 2022-12-10 DIAGNOSIS — L814 Other melanin hyperpigmentation: Secondary | ICD-10-CM | POA: Diagnosis not present

## 2022-12-10 DIAGNOSIS — L821 Other seborrheic keratosis: Secondary | ICD-10-CM | POA: Diagnosis not present

## 2022-12-10 DIAGNOSIS — L7211 Pilar cyst: Secondary | ICD-10-CM | POA: Diagnosis not present

## 2022-12-15 ENCOUNTER — Ambulatory Visit (INDEPENDENT_AMBULATORY_CARE_PROVIDER_SITE_OTHER): Payer: BC Managed Care – PPO | Admitting: Psychiatry

## 2022-12-15 DIAGNOSIS — G3184 Mild cognitive impairment, so stated: Secondary | ICD-10-CM

## 2022-12-15 DIAGNOSIS — Z634 Disappearance and death of family member: Secondary | ICD-10-CM

## 2022-12-15 DIAGNOSIS — Z62820 Parent-biological child conflict: Secondary | ICD-10-CM

## 2022-12-15 DIAGNOSIS — G4730 Sleep apnea, unspecified: Secondary | ICD-10-CM

## 2022-12-15 DIAGNOSIS — F1911 Other psychoactive substance abuse, in remission: Secondary | ICD-10-CM

## 2022-12-15 DIAGNOSIS — G4710 Hypersomnia, unspecified: Secondary | ICD-10-CM | POA: Diagnosis not present

## 2022-12-15 DIAGNOSIS — F411 Generalized anxiety disorder: Secondary | ICD-10-CM | POA: Diagnosis not present

## 2022-12-15 DIAGNOSIS — Z87820 Personal history of traumatic brain injury: Secondary | ICD-10-CM

## 2022-12-15 DIAGNOSIS — F3132 Bipolar disorder, current episode depressed, moderate: Secondary | ICD-10-CM

## 2022-12-15 NOTE — Progress Notes (Signed)
Psychotherapy Progress Note [facility] [name], PhD LP  Patient ID: Johnny [name])    MRN: [id number] Therapy format: Individual psychotherapy Date: 12/15/2022      Start: 4:13p     Stop: 5:02p     Time Spent: 49 min Location: In-person   Session narrative (presenting needs, interim history, self-report of stressors and symptoms, applications of prior therapy, status changes, and interventions made in session) Was out househunting with [name] Sunday last week and took a header on a steep driveway, with impact to his temple and cut orbit.  ED visit 2 days later, with no abnormal findings, abrasions, thankfully no concussion.  Apparently did call here asking advice, directed to PCP, who only advised more Tylenol.  ED RX'd Reglan.  Aside from the pain, worse part was that he could not attend the celebration of life for [name]'s son Will.  Just so sad, too, what happened.  Knows [name] was satisfied with the event, and -- noting a bit of irony -- ex-W [name] spoke in his place (also formerly in radio).  Figures to be in touch again soon with [facility].  [name] has her specialist visit for cirrhotic liver disease, with likely need for transplant.  Also known that she has tumors on her kidneys.  She's talking about pushing up the wedding, based on suspicion of poorer prognosis.  Already tired, sleeping extra.  Support/empathy provided.   [name] is visiting this weekend, [name] picking him up for a 36-hr Father's Day visit.  Anticipates no conflict, actually, just an opportunity to hang out more.  No reason to believe the house or the company will be provocative to [name], now persuaded that his stabilized living environment and improved medication in [place] have him basically docile, and he has been reliably amicable in phon calls so far.  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  [name] still leery of [name],  based on the stories she's heard, but plans look to include some limited time together.  Been purging some things stuff at home, realizing he has clutter he really does not want or need.  Still has the place where mom slept, and died, and the other reasons to want out of this home.  Encouraged in patience while he does the things it takes.    And [name] is expecting now, working up to being a first-time grandfather.    Affirmed and encouraged on all fronts.  Therapeutic modalities: Cognitive Behavioral Therapy, Solution-Oriented/Positive Psychology, Environmental manager, and Faith-sensitive  Mental Status/Observations:  Appearance:   Casual     Behavior:  Appropriate  Motor:  Normal  Speech/Language:   Clear and Coherent  Affect:  Appropriate  Mood:  normal  Thought process:  normal  Thought content:    WNL  Sensory/Perceptual disturbances:    WNL  Orientation:  Fully oriented  Attention:  Good    Concentration:  Good  Memory:  WNL  Insight:    Fair  Judgment:   Good  Impulse Control:  Good   Risk Assessment: Danger to Self: No Self-injurious Behavior: No Danger to Others: No Physical Aggression / Violence: No Duty to Warn: No Access to Firearms a concern: No  Assessment of progress:  progressing  Diagnosis:   ICD-10-CM   1. Bipolar affective disorder, currently depressed, moderate (HCC)  F31.32     2. Bereavement  Z63.4     3.  Generalized anxiety disorder  F41.1     4. Sleep apnea with hypersomnolence  G47.10    G47.30     5. Relationship problem between parent and child  Z62.820     6. Mild cognitive impairment  G31.84     7. History of concussion  Z87.820     8. History of substance abuse (HCC)  F19.11      Plan:  Mother's aftercare and bereavement -- Continue working through estate tasks.  Notice and forgive points of regret, when needed, and practice allowing the living room area where she bedded down and died to be the place where he actually did love to the  last breath.  Make free use of talking to her imaginally, if it will get things off his head, and where needed imagine her reassurance she is OK and nothing owed her now.  Refresh promises of faith for himself, as he did for her in life.  Griefshare program available if he regains interest. Support -- Maintain personal support with [employer] and any available friends.  Endorse  [name]'s care -- Continue to work with Medicaid, [name], and [name] as appropriate.  Stay open to positive developments, ask before assuming the worse, and use calls and visits to foster more mature working relationship.   Family conflict -- Self-affirm good relationship with [name], continue retiring resentments with [name], focus on what he's building with [name] and welcoming the next generation.. Other health care -- May need to reexamine sleep apnea, treatment or other interferences with sleep.  Follow through tending to teeth.  Maintain abstinence from alcohol and drugs. Other recommendations/advice -- As may be noted above.  Continue to utilize previously learned skills ad lib. Medication compliance -- Maintain medication as prescribed and work faithfully with relevant prescriber(s) if any changes are desired or seem indicated. Crisis service -- Aware of call list and work-in appts.  Call the clinic on-call service, 988/hotline, 911, or present to [facility] or ER if any life-threatening psychiatric crisis. Followup -- Return for time as already scheduled.  Next scheduled visit with me 12/29/2022.  Next scheduled in this office 12/17/2022.  Robley Fries, PhD [name], PhD LP Clinical Psychologist, [facility] [address], [address] [place], [place] [address] [phone]

## 2022-12-17 ENCOUNTER — Ambulatory Visit: Payer: BC Managed Care – PPO | Admitting: Psychiatry

## 2022-12-19 ENCOUNTER — Encounter: Payer: Self-pay | Admitting: Psychiatry

## 2022-12-19 ENCOUNTER — Ambulatory Visit (INDEPENDENT_AMBULATORY_CARE_PROVIDER_SITE_OTHER): Payer: BC Managed Care – PPO | Admitting: Psychiatry

## 2022-12-19 DIAGNOSIS — F3162 Bipolar disorder, current episode mixed, moderate: Secondary | ICD-10-CM

## 2022-12-19 DIAGNOSIS — F3132 Bipolar disorder, current episode depressed, moderate: Secondary | ICD-10-CM

## 2022-12-19 DIAGNOSIS — F5105 Insomnia due to other mental disorder: Secondary | ICD-10-CM | POA: Diagnosis not present

## 2022-12-19 DIAGNOSIS — F411 Generalized anxiety disorder: Secondary | ICD-10-CM

## 2022-12-19 DIAGNOSIS — G4730 Sleep apnea, unspecified: Secondary | ICD-10-CM

## 2022-12-19 DIAGNOSIS — Z87820 Personal history of traumatic brain injury: Secondary | ICD-10-CM

## 2022-12-19 DIAGNOSIS — G4710 Hypersomnia, unspecified: Secondary | ICD-10-CM | POA: Diagnosis not present

## 2022-12-19 MED ORDER — QUETIAPINE FUMARATE 400 MG PO TABS: 400.0000 mg | ORAL_TABLET | Freq: Every day | ORAL | 0 refills | Status: DC

## 2022-12-19 NOTE — Progress Notes (Signed)
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 depression,  lorazepam for anxiety 1 mg TID  oxcarbazepine 300 mg tablets 2 every morning and 4 nightly for bipolar disorder,   clonidine 0.2 mg BID for  off label for anxiety and mixed bipolar sx and should also help htn noted.,  continue Seroquel 300 mg nightly for bipolar disorder and chronic treatment resistant depression, option increase back to 400 mg to see if can sleep better Options mirtazapine 15 mg hs for persistent insomnia.    Disc ED and meds for it.  Disc [facility].  Sildenafil 100 tolerated prn.  See PCP about leg weakness  30 min Counseling dealing with mother's death 09-22-2022. also discussed stressors dealing with his chronically mentally ill son who is in a group home at the moment but is coming to visit over the weekend.  Discussed how to manage the situation in hopes of no complications as have occurred in the past.  Can't medicate grief.   FU 2 mos for support  Meredith Staggers MD, DFAPA  Please see After Visit Summary for patient specific instructions.  Future Appointments  Date Time Provider Department Center  12/29/2022  3:00 PM Robley Fries, PhD CP-CP None  01/21/2023   1:00 PM Robley Fries, PhD CP-CP None  02/03/2023  3:00 PM Robley Fries, PhD CP-CP None  02/17/2023  2:00 PM Robley Fries, PhD CP-CP None  02/19/2023  4:00 PM Cottle, Steva Ready., MD CP-CP None  03/03/2023  4:00 PM Robley Fries, PhD CP-CP None    No orders of the defined types were placed in this encounter.      -------------------------------

## 2022-12-29 ENCOUNTER — Ambulatory Visit (INDEPENDENT_AMBULATORY_CARE_PROVIDER_SITE_OTHER): Payer: BC Managed Care – PPO | Admitting: Psychiatry

## 2022-12-29 DIAGNOSIS — F3132 Bipolar disorder, current episode depressed, moderate: Secondary | ICD-10-CM | POA: Diagnosis not present

## 2022-12-29 DIAGNOSIS — G4710 Hypersomnia, unspecified: Secondary | ICD-10-CM

## 2022-12-29 DIAGNOSIS — Z638 Other specified problems related to primary support group: Secondary | ICD-10-CM

## 2022-12-29 DIAGNOSIS — G4730 Sleep apnea, unspecified: Secondary | ICD-10-CM

## 2022-12-29 DIAGNOSIS — Z634 Disappearance and death of family member: Secondary | ICD-10-CM

## 2022-12-29 DIAGNOSIS — Z87820 Personal history of traumatic brain injury: Secondary | ICD-10-CM | POA: Diagnosis not present

## 2022-12-29 DIAGNOSIS — F411 Generalized anxiety disorder: Secondary | ICD-10-CM | POA: Diagnosis not present

## 2022-12-29 NOTE — Progress Notes (Signed)
Psychotherapy Progress Note [facility] [name], PhD LP  Patient ID: Johnny [name])    MRN: [id number] Therapy format: Individual psychotherapy Date: 12/29/2022      Start: 3:15p     Stop: 4:00p     Time Spent: 45 min Location: In-person   Session narrative (presenting needs, interim history, self-report of stressors and symptoms, applications of prior therapy, status changes, and interventions made in session) Suffering what sounds like myofascial pain.  Recommended see PCP or PT.  Says Dr. Jennelle Human recommended B12 (not for orthopedic issues).  Discussed general utility of B complex and D, and testing.  Recommended fold into a single request to PCP to assess common vitamin issues.    [name]'s visit ran well, just showed out some on transition back to [name], which is probably to be expected.  Had some dysphoria starting getting ready to leave [name] house, actually, fitting with attachment issue and the lack of practice transitioning in the past year or so.  Word from th [name] couple positive about how [name] adjusted and how they handled him after return.  Now trying to sell the house, had a showing with few lookers, but one for the 2nd time.  Downcast about it, then got a surprise request to view, right at his most discouraged point.  Buoyed also by picking up wedding rings.  Sept 20 is date set now.  Finds himself more over the divorce, though still reluctant to let out the news to where [name] might know.  Discussed real vs. perceived risks and the likelihood [name] has already divulged.  Therapeutic modalities: Cognitive Behavioral Therapy, Solution-Oriented/Positive Psychology, Environmental manager, and Faith-sensitive  Mental Status/Observations:  Appearance:   Casual     Behavior:  Appropriate  Motor:  Normal  Speech/Language:   Clear and Coherent  Affect:  Appropriate  Mood:  dysthymic and less  Thought process:  normal  Thought content:    WNL   Sensory/Perceptual disturbances:    WNL  Orientation:  Fully oriented  Attention:  Good    Concentration:  Good  Memory:  WNL  Insight:    Fair  Judgment:   Good  Impulse Control:  Good   Risk Assessment: Danger to Self: No Self-injurious Behavior: No Danger to Others: No Physical Aggression / Violence: No Duty to Warn: No Access to Firearms a concern: No  Assessment of progress:  progressing  Diagnosis:   ICD-10-CM   1. Bipolar affective disorder, currently depressed, moderate (HCC)  F31.32     2. Generalized anxiety disorder  F41.1     3. Sleep apnea with hypersomnolence  G47.10    G47.30     4. History of concussion  Z87.820     5. Relationship problem with family member  Z63.8     6. Bereavement  Z63.4      Plan:  Mother's aftercare and bereavement -- Continue working through estate tasks.  Notice and forgive points of regret, when needed, and practice allowing the living room area where she bedded down and died to be the place where he actually did love to the last breath.  Make free use of talking to her imaginally, if it will get things off his head, and where needed imagine her reassurance she is OK and nothing owed her now.  Refresh promises of faith for himself, as he did for her in life.  Griefshare program available if he regains interest. Support -- Maintain personal support with [name] and  any available friends.  Endorse plans to marry. [name]'s care -- Continue to work with Medicaid, [name], and [name] as appropriate.  Stay open to positive developments, ask before assuming the worse, and use calls and visits to foster more mature working relationship.  Look to future visits to practice coping with transitions and attachment to two homes. Family conflict -- Self-affirm good relationship with [name], continue retiring resentments with [name], focus on what he's building with [name] and welcoming the next generation.  S about allowing news to reach [name] and  subdue worry. Other health care -- May need to reexamine sleep apnea, treatment or other interferences with sleep.  Follow through tending to teeth.  Maintain abstinence from alcohol and drugs. Other recommendations/advice -- As may be noted above.  Continue to utilize previously learned skills ad lib. Medication compliance -- Maintain medication as prescribed and work faithfully with relevant prescriber(s) if any changes are desired or seem indicated. Crisis service -- Aware of call list and work-in appts.  Call the clinic on-call service, 988/hotline, 911, or present to [facility] or ER if any life-threatening psychiatric crisis. Followup -- No follow-ups on file.  Next scheduled visit with me 01/21/2023.  Next scheduled in this office 01/21/2023.  Robley Fries, PhD [name], PhD LP Clinical Psychologist, [facility] [address] [place], [place] [address] [phone]

## 2023-01-16 ENCOUNTER — Other Ambulatory Visit: Payer: Self-pay | Admitting: Psychiatry

## 2023-01-16 ENCOUNTER — Ambulatory Visit: Payer: BC Managed Care – PPO | Admitting: Psychiatry

## 2023-01-16 DIAGNOSIS — F3162 Bipolar disorder, current episode mixed, moderate: Secondary | ICD-10-CM

## 2023-01-21 ENCOUNTER — Ambulatory Visit (INDEPENDENT_AMBULATORY_CARE_PROVIDER_SITE_OTHER): Payer: BC Managed Care – PPO | Admitting: Psychiatry

## 2023-01-21 ENCOUNTER — Other Ambulatory Visit: Payer: Self-pay | Admitting: Psychiatry

## 2023-01-21 DIAGNOSIS — F411 Generalized anxiety disorder: Secondary | ICD-10-CM | POA: Diagnosis not present

## 2023-01-21 DIAGNOSIS — Z634 Disappearance and death of family member: Secondary | ICD-10-CM

## 2023-01-21 DIAGNOSIS — Z87820 Personal history of traumatic brain injury: Secondary | ICD-10-CM

## 2023-01-21 DIAGNOSIS — G4710 Hypersomnia, unspecified: Secondary | ICD-10-CM | POA: Diagnosis not present

## 2023-01-21 DIAGNOSIS — F3131 Bipolar disorder, current episode depressed, mild: Secondary | ICD-10-CM

## 2023-01-21 DIAGNOSIS — Z638 Other specified problems related to primary support group: Secondary | ICD-10-CM

## 2023-01-21 DIAGNOSIS — Z62820 Parent-biological child conflict: Secondary | ICD-10-CM | POA: Diagnosis not present

## 2023-01-21 DIAGNOSIS — G4730 Sleep apnea, unspecified: Secondary | ICD-10-CM

## 2023-01-21 NOTE — Progress Notes (Signed)
Psychotherapy Progress Note [facility] Johnny Czar, PhD LP  Patient ID: Johnny [name])    MRN: [id number] Therapy format: Individual psychotherapy Date: 01/21/2023      Start: 1:10p     Stop: 1:56p     Time Spent: 46 min Location: In-person   Session narrative (presenting needs, interim history, self-report of stressors and symptoms, applications of prior therapy, status changes, and interventions made in session) Busily involved lately trying to sell his townhome, has a buyer just now getting to due diligence and earnest money, under contract.  Found a place in [name] he and [name] like, feels delivered.  Has not gotten back to the doctor about his fall/injury, though he also had a later slip in the bathroom.  Reeling from the ED bill for the original fall, near $4500 OOP.  Working through the tasks of culling out possessions and packing, which is wearying, but still good reasons to move through, including retiring both his and her "graveyard" home environments.  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    Irritating development well over a year after Johnny [name] began his placement in [place], that his case needs to get transferred to local [facility] administration and direct therapy service.  Good news that it is moving, and that Johnny [name] continues to behave well.  Has not yet told him about the wedding coming, been reluctant, more or less following Johnny [name]'s fear that Johnny [name] does not want to see his family changed or his mother seemingly replaced.  Challenged that as unrealistic both for the situation they have with Johnny [name] well over a year in alternate living and for Johnny [name]'s considerably deeper knowledge of his son, not to mention autistic processing and signs he already has that Johnny [name]  has accepted Johnny [name] just fine in his father's life, even asks about her and her dog.  Assured it sounds like a full on green light to let him know of wedding plans.  Mild concern about ex-wife Johnny [name] knowing, but easily admits it's OK if she finds out -- nobody has claim on each other, and there is no harm to be done by knowing.  In fact, Johnny [name] has already indicated she'd like to invite Johnny [name] to Johnny [name] baby shower, signaling she is already accepted as new family, something like a new sister-in-law to ex-wife.  Encouraged go ahead and allow news to pass to Johnny [name] as well.  Been in good touch with Johnny [name] with her pregnancy, and finding greater happiness in expecting to be a grandfather and in being in frequent, positive contact with her about it.  Therapeutic modalities: Cognitive Behavioral Therapy, Solution-Oriented/Positive Psychology, Environmental manager, and Faith-sensitive  Mental Status/Observations:  Appearance:   Casual     Behavior:  Appropriate  Motor:  Normal  Speech/Language:   Clear and Coherent  Affect:  Appropriate  Mood:  dysthymic and more positive  Thought process:  normal  Thought content:    WNL  Sensory/Perceptual disturbances:    WNL  Orientation:  Fully oriented  Attention:  Good    Concentration:  Good  Memory:  WNL  Insight:    Good  Judgment:   Good  Impulse Control:  Good   Risk Assessment: Danger to Self: No Self-injurious Behavior: No Danger to Others: No Physical Aggression / Violence: No Duty to Warn:  No Access to Firearms a concern: No  Assessment of progress:  progressing  Diagnosis:   ICD-10-CM   1. Bipolar affective disorder, currently depressed, mild (HCC)  F31.31     2. Generalized anxiety disorder  F41.1     3. Sleep apnea with hypersomnolence  G47.10    G47.30     4. Relationship problem between parent and child  Z62.820     5. Relationship problem with family member  Z63.8     6. History of concussion  Z87.820     7. Bereavement  Z63.4       Plan:  Mother's aftercare and bereavement -- Continue working through estate tasks.  Notice and forgive points of regret, when needed, and practice allowing the living room area where she bedded down and died to be the place where he actually did love to the last breath.  Make free use of talking to her imaginally, if it will get things off his head, and where needed imagine her reassurance she is OK and nothing owed her now.  Refresh promises of faith for himself, as he did for her in life.  Griefshare program available if he regains interest. Support -- Maintain personal support with [employer] and any available friends.  Endorse plans to marry. Johnny [name]'s care -- Continue to work with Medicaid, [name], and [name] as appropriate.  Stay open to positive developments, ask before assuming the worse, and use calls and visits to foster more mature working relationship.  Look to future visits to practice coping with transitions and attachment to two homes. Family conflict -- Self-affirm good relationship with Johnny [name], continue retiring resentments with Johnny [name], focus on what he's building with Johnny [name] and welcoming the next generation.  S about allowing news to reach Johnny [name] and subdue worry. Other health care -- May need to reexamine sleep apnea, treatment or other interferences with sleep.  Follow through tending to teeth.  Maintain abstinence from alcohol and drugs. Other recommendations/advice -- As may be noted above.  Continue to utilize previously learned skills ad lib. Medication compliance -- Maintain medication as prescribed and work faithfully with relevant prescriber(s) if any changes are desired or seem indicated. Crisis service -- Aware of call list and work-in appts.  Call the clinic on-call service, 988/hotline, 911, or present to [facility] or ER if any life-threatening psychiatric crisis. Followup -- Return for time as already scheduled.  Next scheduled visit with me 02/03/2023.  Next scheduled in this  office 02/03/2023.  Robley Fries, PhD Johnny Czar, PhD LP Clinical Psychologist, [facility] [address] [place], [place] [address] [phone]

## 2023-01-27 DIAGNOSIS — R252 Cramp and spasm: Secondary | ICD-10-CM | POA: Diagnosis not present

## 2023-01-27 DIAGNOSIS — R29898 Other symptoms and signs involving the musculoskeletal system: Secondary | ICD-10-CM | POA: Diagnosis not present

## 2023-01-27 DIAGNOSIS — F319 Bipolar disorder, unspecified: Secondary | ICD-10-CM | POA: Diagnosis not present

## 2023-01-30 ENCOUNTER — Other Ambulatory Visit: Payer: Self-pay | Admitting: Psychiatry

## 2023-01-30 DIAGNOSIS — F3162 Bipolar disorder, current episode mixed, moderate: Secondary | ICD-10-CM

## 2023-02-03 ENCOUNTER — Ambulatory Visit (INDEPENDENT_AMBULATORY_CARE_PROVIDER_SITE_OTHER): Payer: BC Managed Care – PPO | Admitting: Psychiatry

## 2023-02-03 DIAGNOSIS — Z638 Other specified problems related to primary support group: Secondary | ICD-10-CM

## 2023-02-03 DIAGNOSIS — F3131 Bipolar disorder, current episode depressed, mild: Secondary | ICD-10-CM | POA: Diagnosis not present

## 2023-02-03 DIAGNOSIS — F411 Generalized anxiety disorder: Secondary | ICD-10-CM | POA: Diagnosis not present

## 2023-02-03 DIAGNOSIS — Z62820 Parent-biological child conflict: Secondary | ICD-10-CM | POA: Diagnosis not present

## 2023-02-03 DIAGNOSIS — Z634 Disappearance and death of family member: Secondary | ICD-10-CM
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
## 2023-02-16 ENCOUNTER — Other Ambulatory Visit: Payer: Self-pay

## 2023-02-16 ENCOUNTER — Telehealth: Payer: Self-pay | Admitting: Psychiatry

## 2023-02-16 DIAGNOSIS — F411 Generalized anxiety disorder: Secondary | ICD-10-CM

## 2023-02-16 MED ORDER — LORAZEPAM 1 MG PO TABS: 1.0000 mg | ORAL_TABLET | Freq: Three times a day (TID) | ORAL | 1 refills | Status: DC | PRN

## 2023-02-16 NOTE — Telephone Encounter (Signed)
Next appt is 02/19/23. [name] called and said that he has been taking Lorazepam 1 mg. He said that a lot of the pills in his bottle disintegrated and just power now. The other 12 are whole pills in the bottle. He doesn't know what could have caused this. His phone number is [phone]. Pharmacy is:  [facility], [phone] [address] [place], [place] . Phone number is [phone].

## 2023-02-16 NOTE — Telephone Encounter (Signed)
Pended.

## 2023-02-17 ENCOUNTER — Ambulatory Visit (INDEPENDENT_AMBULATORY_CARE_PROVIDER_SITE_OTHER): Payer: BC Managed Care – PPO | Admitting: Psychiatry

## 2023-02-17 DIAGNOSIS — G4710 Hypersomnia, unspecified: Secondary | ICD-10-CM

## 2023-02-17 DIAGNOSIS — Z634 Disappearance and death of family member: Secondary | ICD-10-CM | POA: Diagnosis not present

## 2023-02-17 DIAGNOSIS — G4730 Sleep apnea, unspecified: Secondary | ICD-10-CM

## 2023-02-17 DIAGNOSIS — F3131 Bipolar disorder, current episode depressed, mild: Secondary | ICD-10-CM | POA: Diagnosis not present

## 2023-02-17 DIAGNOSIS — F411 Generalized anxiety disorder: Secondary | ICD-10-CM

## 2023-02-17 DIAGNOSIS — Z638 Other specified problems related to primary support group: Secondary | ICD-10-CM | POA: Diagnosis not present

## 2023-02-17 DIAGNOSIS — Z87820 Personal history of traumatic brain injury: Secondary | ICD-10-CM
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
## 2023-02-18 DIAGNOSIS — E871 Hypo-osmolality and hyponatremia: Secondary | ICD-10-CM | POA: Diagnosis not present

## 2023-02-19 ENCOUNTER — Encounter: Payer: Self-pay | Admitting: Psychiatry

## 2023-02-19 ENCOUNTER — Ambulatory Visit (INDEPENDENT_AMBULATORY_CARE_PROVIDER_SITE_OTHER): Payer: BC Managed Care – PPO | Admitting: Psychiatry

## 2023-02-19 DIAGNOSIS — F3131 Bipolar disorder, current episode depressed, mild: Secondary | ICD-10-CM

## 2023-02-19 DIAGNOSIS — G4710 Hypersomnia, unspecified: Secondary | ICD-10-CM | POA: Diagnosis not present

## 2023-02-19 DIAGNOSIS — G4730 Sleep apnea, unspecified: Secondary | ICD-10-CM

## 2023-02-19 DIAGNOSIS — F5105 Insomnia due to other mental disorder: Secondary | ICD-10-CM | POA: Diagnosis not present

## 2023-02-19 DIAGNOSIS — F411 Generalized anxiety disorder: Secondary | ICD-10-CM | POA: Diagnosis not present

## 2023-02-19 NOTE — Progress Notes (Addendum)
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 better.   Stress with exW is better..  We discussed the short-term risks associated with benzodiazepines including sedation and increased fall risk among others.  Discussed long-term side effect risk including dependence, potential withdrawal symptoms, and the potential eventual dose-related risk of dementia.  But recent studies from 2020 dispute this association between benzodiazepines and dementia risk. Newer studies in 2020 do not support an association with dementia.  Disc in detail concerns about tolerance which may be developing. No SE  Discussed potential metabolic side effects associated with atypical antipsychotics, as well as potential risk for movement side effects. Advised pt to contact office if movement side effects occur.  Disc risk sedation with current med regimen.  Consider Auvelity for chronic depression with different mechanism.  Discussed the polypharmacy which is not ideal but necessary.  He is taking .  DT stress can't go down further with meds.  Disc SE.  Lorazepam doesn't make him sleepy  Lamotrigine 100 TID and 200 HS for bipolar depression,  lorazepam for anxiety 1 mg  TID  oxcarbazepine 300 mg tablets 2 every morning and 4 nightly for bipolar disorder,   clonidine 0.2 mg BID for  off label for anxiety and mixed bipolar sx and should also help htn noted.,  continue Seroquel 400 mg .  Increase helped.   Disc ED and meds for it.  Disc GoodRX.  Sildenafil 100 tolerated prn.  See PCP about leg weakness  30 min Counseling dealing with mother's death 2022/09/09. also discussed stressors dealing with his chronically mentally ill son who  is in a group home at the moment but is coming to visit over the weekend.  Discussed how to manage the situation in hopes of no complications as have occurred in the past.  Can't medicate grief.   FU 2 mos for support  Meredith Staggers MD, DFAPA  Please see After Visit Summary for patient specific instructions.  Future Appointments  Date Time Provider Department Center  03/31/2023  4:00 PM Robley Fries, PhD [facility] None  04/01/2023  1:30 PM Cottle, Steva Ready., MD [facility] None  04/13/2023  3:00 PM Robley Fries, PhD [facility] None  04/27/2023  4:00 PM Robley Fries, PhD [facility] None  05/11/2023  4:00 PM Robley Fries, PhD CP[facility] None  05/25/2023  3:00 PM Penumalli, Glenford Bayley, MD [facility] None  05/25/2023  4:00 PM Mitchum, Molly Maduro, PhD CP[facility] None    No orders of the defined types were placed in this encounter.      -------------------------------

## 2023-03-03 ENCOUNTER — Ambulatory Visit (INDEPENDENT_AMBULATORY_CARE_PROVIDER_SITE_OTHER): Payer: BC Managed Care – PPO | Admitting: Psychiatry

## 2023-03-03 DIAGNOSIS — F3131 Bipolar disorder, current episode depressed, mild: Secondary | ICD-10-CM | POA: Diagnosis not present

## 2023-03-03 DIAGNOSIS — Z634 Disappearance and death of family member: Secondary | ICD-10-CM

## 2023-03-03 DIAGNOSIS — G4710 Hypersomnia, unspecified: Secondary | ICD-10-CM

## 2023-03-03 DIAGNOSIS — F411 Generalized anxiety disorder: Secondary | ICD-10-CM | POA: Diagnosis not present

## 2023-03-03 DIAGNOSIS — G4730 Sleep apnea, unspecified: Secondary | ICD-10-CM

## 2023-03-03 NOTE — Progress Notes (Signed)
Psychotherapy Progress Note [facility] 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  Gad to be back on the job, but feeling stupid about forgetting to re-up a software license, which prevented him from completing work until fixed.  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  Living space is coming together.  Found a wedding venue.  Just need to find Johnny [name]'s divorce decree to be able to get marriage license.  Not worried, just trying to make progress.  Today is 45th anniv of his divorce -- with no drama attached, feels fully accommodated -- and also Johnny [name]'s would-be 89th birthday, which is evocative.  Happily, talked Johnny [name] out of taking a job that would have required an extensive commute and into taking a meeting on one she initially discounted that turned out to be super attractive.  Bodes well for job satisfaction and income.  She is still battling liver failure, however, and most likely to have to go on the transplant list.  Experiences weekly sickness the day after a self-injection, sometimes 2.  Can't help but wonder if God brought her into his life only to take her away again.  Validated no choice but to consider the possibility he'll lose her, but encouraged continued focus on what they can do and can live while they have the time, and let the chips fall.  Supportively confronted whether he believes God works that way as well.  Therapeutic modalities: Cognitive Behavioral Therapy, Solution-Oriented/Positive Psychology, Ego-Supportive, and  Faith-sensitive  Mental Status/Observations:  Appearance:   Casual     Behavior:  Appropriate  Motor:  Normal  Speech/Language:   Clear and Coherent  Affect:  Appropriate  Mood:  dysthymic and worrisome, better  Thought process:  normal  Thought content:    Worry, less  Sensory/Perceptual disturbances:    WNL  Orientation:  Fully oriented  Attention:  Good    Concentration:  Good  Memory:  grossly intact  Insight:    Variable  Judgment:   Good  Impulse Control:  Good   Risk Assessment: Danger to Self: No Self-injurious Behavior: No Danger to Others: No Physical Aggression / Violence: No Duty to Warn: No Access to Firearms a concern: No  Assessment of progress:  progressing  Diagnosis:   ICD-10-CM   1. Bipolar affective disorder, currently depressed, mild (HCC)  F31.31     2. Generalized anxiety disorder  F41.1     3. Sleep apnea with hypersomnolence  G47.10    G47.30     4. Bereavement  Z63.4      Plan:  Bereavement -- Continue working through estate tasks, if any remain.  Where needed, notice and forgive Johnny [name] passing on his watch.  Make free use of talking to her imaginally, if it will get things off his head or heart.  Where needed imagine her own reassuring that she is OK and nothing owed to her now.  Refresh promises of faith for himself, as he did for her in life.  Griefshare program available if he regains interest. Support system --  Maintain personal support with fiancee Johnny [name] and any available friends.  Endorse plans to marry and make home together. Johnny [name]'s care -- Continue to work with Medicaid, [name], and the Johnny [name] as appropriate.  Stay open to positive developments, ask before assuming the worse, and use calls and visits to foster more mature working relationship.  Look to future visits with him to practice coping with transitions and attachment to two homes. Family conflict -- Self-affirm good relationship with Johnny [name], continue retiring resentments with  Johnny [name], focus on what he's building with Johnny [name] and welcoming the next generation.  S about allowing news to reach Johnny [name] and subdue worry. Other health care -- May need to reexamine sleep apnea and treatment or other interferences with sleep.  Follow through tending to teeth.  Maintain abstinence from alcohol and drugs.  Stretching and reasonable exercise for orthopedic issues and weight. Other recommendations/advice -- As may be noted above.  Continue to utilize previously learned skills ad lib. Medication compliance -- Maintain medication as prescribed and work faithfully with relevant prescriber(s) if any changes are desired or seem indicated. Crisis service -- Aware of call list and work-in appts.  Call the clinic on-call service, 988/hotline, 911, or present to [facility] or ER if any life-threatening psychiatric crisis. Followup -- Return for time as already scheduled.  Next scheduled visit with me 03/31/2023.  Next scheduled in this office 03/31/2023.  Johnny Fries, PhD Johnny Czar, PhD LP Clinical Psychologist, [facility] [address], [address] [place], [place] [address] [phone]

## 2023-03-05 DIAGNOSIS — E871 Hypo-osmolality and hyponatremia: Secondary | ICD-10-CM | POA: Diagnosis not present

## 2023-03-16 ENCOUNTER — Other Ambulatory Visit: Payer: Self-pay | Admitting: Psychiatry

## 2023-03-16 DIAGNOSIS — F3162 Bipolar disorder, current episode mixed, moderate: Secondary | ICD-10-CM

## 2023-03-17 ENCOUNTER — Telehealth: Payer: Self-pay | Admitting: Diagnostic Neuroimaging

## 2023-03-17 NOTE — Telephone Encounter (Signed)
Noted  

## 2023-03-17 NOTE — Telephone Encounter (Signed)
Pt called with current insurance info  [employer] [place] [employer] [place] Member ID: [id number] Group # [id number] BIN# [phone]

## 2023-03-18 ENCOUNTER — Telehealth: Payer: Self-pay | Admitting: Diagnostic Neuroimaging

## 2023-03-18 ENCOUNTER — Other Ambulatory Visit: Payer: Self-pay | Admitting: Psychiatry

## 2023-03-18 ENCOUNTER — Ambulatory Visit: Payer: Self-pay | Admitting: Diagnostic Neuroimaging

## 2023-03-18 ENCOUNTER — Encounter: Payer: Self-pay | Admitting: Diagnostic Neuroimaging

## 2023-03-18 VITALS — BP 124/78 | HR 75 | Ht 70.0 in | Wt 266.2 lb

## 2023-03-18 DIAGNOSIS — R29898 Other symptoms and signs involving the musculoskeletal system: Secondary | ICD-10-CM

## 2023-03-18 DIAGNOSIS — F3162 Bipolar disorder, current episode mixed, moderate: Secondary | ICD-10-CM

## 2023-03-18 NOTE — Patient Instructions (Addendum)
  LEG WEAKNESS / PAIN / NUMBNESS  - check neuropathy labs  - check MRI brain, lumbar spine  - use walker; use shower chair / bench  - refer to home health PT evaluation

## 2023-03-18 NOTE — Telephone Encounter (Signed)
Pt was accepted by [facility], office # [phone].
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
## 2023-03-19 ENCOUNTER — Telehealth: Payer: Self-pay | Admitting: Diagnostic Neuroimaging

## 2023-03-19 NOTE — Telephone Encounter (Signed)
[facility] Numbers: [id number] exp. 03/19/23-04/17/23 sent to GI [phone]

## 2023-03-24 NOTE — Telephone Encounter (Signed)
   FYI- received email that pt is refusing HH services at this time.

## 2023-03-24 NOTE — Telephone Encounter (Signed)
noted 

## 2023-03-31 ENCOUNTER — Ambulatory Visit: Payer: BC Managed Care – PPO | Admitting: Psychiatry

## 2023-03-31 DIAGNOSIS — F3131 Bipolar disorder, current episode depressed, mild: Secondary | ICD-10-CM | POA: Diagnosis not present

## 2023-03-31 DIAGNOSIS — G4730 Sleep apnea, unspecified: Secondary | ICD-10-CM

## 2023-03-31 DIAGNOSIS — G4710 Hypersomnia, unspecified: Secondary | ICD-10-CM

## 2023-03-31 DIAGNOSIS — R69 Illness, unspecified: Secondary | ICD-10-CM

## 2023-03-31 DIAGNOSIS — Z87820 Personal history of traumatic brain injury: Secondary | ICD-10-CM

## 2023-03-31 DIAGNOSIS — F411 Generalized anxiety disorder: Secondary | ICD-10-CM | POA: Diagnosis not present

## 2023-03-31 DIAGNOSIS — G3184 Mild cognitive impairment, so stated: Secondary | ICD-10-CM | POA: Diagnosis not present

## 2023-03-31 NOTE — Progress Notes (Signed)
Psychotherapy Progress Note [facility] [name], PhD LP  Patient ID: Johnny [name])    MRN: [id number] Therapy format: Individual psychotherapy Date: 03/31/2023      Start: 4:12p     Stop: 5:22p     Time Spent: 70 min Location: In-person   Session narrative (presenting needs, interim history, self-report of stressors and symptoms, applications of prior therapy, status changes, and interventions made in session) Since last seen, has re-engaged neurology about weakness in legs.  Historical note confirms hx of postconcussion syndrome 2016, recommendations for MRI (accepted) and home health (PT, actually -- declined).  Gradual onset since last year, c/o weakness, numbness that switches sides, some dizziness.  Did get worse with falls, one in driveway househunting and 2 falls at [name] that involved head impact on furniture, 1 week after seeing neuro.  Falls involved HA, double vision, no nausea, no LOC.  With legs, he has sudden losses of strength that sound most like a spinal nerve impingement issue.  Nothing about meds that seem likely.  Latest labs show borderline low B12.  Discussed at some length his decision to decline PT at home -- boils down to feeling rushed in the neurologist's office, feeling apprehensive about doing anything to further injure himself, then feeling bumrushed by the scheduling agent for PT, and lack of trust whether his condition is clearly assessed.  Able to show him that his physician does trust that his back is not in fragile condition, and PT was recommended -- if inadequately explained -- to go ahead and work on his condition and ability to manage rather than allow him to deteriorate.  Persuaded to call back and notify he's changed his mind, though he may want to do PT at the facility rather than home.  MRI is not till mid November, so well worth getting ahead on relief and renewed confidence if it can offer.  [name]'s liver is stable according  to ultrasound, on 6-mo f/u.  Wedding planning still to finalize but hopeful in two and a half weeks.  [name] will be coming up for another homestay, including first time at new house, with [name].  Has shared the living situation with him, just not the getting married part, but will.    Misses M, without undue distress.  Definitely no longer haunted by reminders or regret.  Therapeutic modalities: Cognitive Behavioral Therapy, Solution-Oriented/Positive Psychology, Ego-Supportive, and Psycho-education/Bibliotherapy  Mental Status/Observations:  Appearance:   Casual     Behavior:  Appropriate  Motor:  Cane, slowed gait  Speech/Language:   Clear and Coherent  Affect:  Appropriate  Mood:  dysthymic  Thought process:  normal  Thought content:    WNL  Sensory/Perceptual disturbances:    WNL  Orientation:  Fully oriented  Attention:  Good    Concentration:  Good  Memory:  grossly intact  Insight:    Fair  Judgment:   Variable  Impulse Control:  Good   Risk Assessment: Danger to Self: No Self-injurious Behavior: No Danger to Others: No Physical Aggression / Violence: No Duty to Warn: No Access to Firearms a concern: No  Assessment of progress:  stabilized  Diagnosis:   ICD-10-CM   1. Bipolar affective disorder, currently depressed, mild (HCC)  F31.31     2. Generalized anxiety disorder  F41.1     3. History of concussion  Z87.820     4. Mild cognitive impairment  G31.84     5. Sleep apnea with hypersomnolence  G47.10  G47.30     6. Suspected lumbar stenosis  [name]      Plan:  Acute ortho/neuro issue -- Sounds likely to be spinal stenosis, but hopefully more remediable issue with PT.  Pt to inform neuro he changed his mind, is interested in PT if it's safe.  Ask questions, if any, don't assume self-defeating or doubtful things. Bereavement -- Continue working through estate tasks, if any remain.  Where needed, notice and forgive [name].  Make free use of  talking to her imaginally, if it will get things off his head or heart.  Where needed imagine her own reassuring that she is OK and nothing owed to her now.  Refresh promises of faith for himself, as he did for her in life.  Griefshare program available if he regains interest. Support system -- Maintain personal support with Lester Kinsman and any available friends.  Endorse plans to marry and make home together. [name]'s care -- Continue to work with Medicaid, [name], and [name] as appropriate.  Stay open to positive developments, ask before assuming the worse, and use calls and visits to foster more mature working relationship.  Look to future visits with him to practice coping with transitions and attachment to two homes. Family conflict -- Self-affirm good relationship with [name], continue retiring resentments with [name], focus on what he's building with [name] and welcoming the next generation.  S about allowing news to reach [name] and subdue worry. Other health care -- May need to reexamine sleep apnea and treatment or other interferences with sleep.  Follow through tending to teeth.  Maintain abstinence from alcohol and drugs.  Stretching and reasonable exercise for orthopedic issues and weight. Other recommendations/advice -- As may be noted above.  Continue to utilize previously learned skills ad lib. Medication compliance -- Maintain medication as prescribed and work faithfully with relevant prescriber(s) if any changes are desired or seem indicated. Crisis service -- Aware of call list and work-in appts.  Call the clinic on-call service, 988/hotline, 911, or present to [facility] or ER if any life-threatening psychiatric crisis. Followup -- Return for time as already scheduled.  Next scheduled visit with me 04/13/2023.  Next scheduled in this office 04/01/2023.  Robley Fries, PhD [name], PhD LP Clinical Psychologist, [facility] [address] [place], [place] [address] [phone]

## 2023-04-01 ENCOUNTER — Encounter: Payer: Self-pay | Admitting: Psychiatry

## 2023-04-01 ENCOUNTER — Ambulatory Visit (INDEPENDENT_AMBULATORY_CARE_PROVIDER_SITE_OTHER): Payer: BC Managed Care – PPO | Admitting: Psychiatry

## 2023-04-01 DIAGNOSIS — R7989 Other specified abnormal findings of blood chemistry: Secondary | ICD-10-CM

## 2023-04-01 DIAGNOSIS — F3131 Bipolar disorder, current episode depressed, mild: Secondary | ICD-10-CM

## 2023-04-01 DIAGNOSIS — F411 Generalized anxiety disorder: Secondary | ICD-10-CM

## 2023-04-01 DIAGNOSIS — G4710 Hypersomnia, unspecified: Secondary | ICD-10-CM

## 2023-04-01 DIAGNOSIS — F5105 Insomnia due to other mental disorder: Secondary | ICD-10-CM

## 2023-04-01 DIAGNOSIS — G3184 Mild cognitive impairment, so stated: Secondary | ICD-10-CM

## 2023-04-01 DIAGNOSIS — G4730 Sleep apnea, unspecified: Secondary | ICD-10-CM

## 2023-04-01 DIAGNOSIS — F3162 Bipolar disorder, current episode mixed, moderate: Secondary | ICD-10-CM

## 2023-04-01 LAB — MULTIPLE MYELOMA PANEL, SERUM
Albumin SerPl Elph-Mcnc: 3.8 g/dL (ref 2.9–4.4)
Albumin/Glob SerPl: 1.5 (ref 0.7–1.7)
Alpha 1: 0.3 g/dL (ref 0.0–0.4)
Alpha2 Glob SerPl Elph-Mcnc: 0.6 g/dL (ref 0.4–1.0)
B-Globulin SerPl Elph-Mcnc: 0.9 g/dL (ref 0.7–1.3)
Gamma Glob SerPl Elph-Mcnc: 0.8 g/dL (ref 0.4–1.8)
Globulin, Total: 2.6 g/dL (ref 2.2–3.9)
IgA/Immunoglobulin A, Serum: 259 mg/dL (ref 61–437)
IgG (Immunoglobin G), Serum: 811 mg/dL (ref 603–1613)
IgM (Immunoglobulin M), Srm: 56 mg/dL (ref 20–172)
Total Protein: 6.4 g/dL (ref 6.0–8.5)

## 2023-04-01 LAB — ANA,IFA RA DIAG PNL W/RFLX TIT/PATN
ANA Titer 1: NEGATIVE
Cyclic Citrullin Peptide Ab: 5 units (ref 0–19)
Rheumatoid fact SerPl-aCnc: <10 IU/mL (ref <14.0)

## 2023-04-01 LAB — MUSK ANTIBODIES: MuSK Antibodies: <1 U/mL

## 2023-04-01 LAB — HEMOGLOBIN A1C
Est. average glucose Bld gHb Est-mCnc: 128 mg/dL
Hgb A1c MFr Bld: 6.1 % — ABNORMAL HIGH (ref 4.8–5.6)

## 2023-04-01 LAB — ACHR ABS WITH REFLEX TO MUSK: AChR Binding Ab, Serum: <0.03 nmol/L (ref 0.00–0.24)

## 2023-04-01 LAB — VITAMIN B12: Vitamin B-12: 215 pg/mL — ABNORMAL LOW (ref 232–1245)

## 2023-04-01 MED ORDER — LORAZEPAM 1 MG PO TABS: 1.0000 mg | ORAL_TABLET | Freq: Three times a day (TID) | ORAL | 1 refills | Status: DC | PRN

## 2023-04-01 MED ORDER — LAMOTRIGINE 100 MG PO TABS
ORAL_TABLET | ORAL | 1 refills | Status: DC
Start: 2023-04-01 — End: 2023-08-03

## 2023-04-01 MED ORDER — CLONIDINE HCL 0.1 MG PO TABS: ORAL_TABLET | ORAL | 5 refills | Status: DC

## 2023-04-01 NOTE — Progress Notes (Signed)
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 ([phone])  .res Assessment: Plan:    Bipolar affective disorder, currently depressed, mild (HCC)  Generalized anxiety disorder - Plan: cloNIDine (CATAPRES) 0.1 MG tablet, LORazepam (ATIVAN) 1 MG tablet  Sleep apnea with hypersomnolence  Mild cognitive impairment  Insomnia due to mental condition  Low vitamin B12 level  Moderate mixed bipolar I disorder (HCC) - Plan: lamoTRIgine (LAMICTAL) 100 MG tablet   30 min face to face time with patient.  We discussed Chronic depression, anxiety, insomnia, stress is ongoing and worse lately. Has never achieved freedom from symptoms.  No significant anger outbursts currently.  No unusual mood swings. Chronic severe stress dealing with severely mentally ill violent son and recent death of Johnny [name]  But much better affect and mood with recent positive changes.  We discussed his high dosages and polypharmacy that are medically necessary.  Disc SE risks esp sedation. Anger is better.   Stress with exW is better..  We discussed the short-term risks associated with benzodiazepines including sedation and increased fall risk among others.  Discussed long-term side effect risk including dependence, potential withdrawal symptoms, and the potential eventual dose-related risk of dementia.  But recent studies from 2020 dispute this association between benzodiazepines and dementia risk. Newer studies in 2020 do not support an association with  dementia.  Disc in detail concerns about tolerance which may be developing. No SE  Discussed potential metabolic side effects associated with atypical antipsychotics, as well as potential risk for movement side effects. Advised pt to contact office if movement side effects occur.  Disc risk sedation with current med regimen.  Consider Auvelity for chronic depression with different mechanism.  Discussed the polypharmacy which is not ideal but necessary.  He  is taking .  DT stress can't go down further with meds.  Disc SE.  Lorazepam doesn't make him sleepy  No med changes Lamotrigine 100 TID and 200 HS for bipolar depression,  lorazepam for anxiety 1 mg TID  oxcarbazepine 300 mg tablets 2 every morning and 4 nightly for bipolar disorder,  clonidine 0.2 mg BID for  off label for anxiety and mixed bipolar sx and should also help htn noted.,  continue Seroquel 400 mg .  Increase helped.  Needs to start B12 for low level 215.  This might help energy, strength , and cognition.   Hx low sodium too in 2017 Next blood draw include B12  and BMP to ensure sodium level is normal.  Dr Wynelle Link has checked this before  Disc ED and meds for it.  Disc GoodRX.  Sildenafil 100 tolerated prn.  Pending neuro about leg weakness Has some sciatica  FU 2 mos for support  Meredith Staggers MD, DFAPA  Please see After Visit Summary for patient specific instructions.  Future Appointments  Date Time Provider Department Center  04/13/2023  3:00 PM Robley Fries, PhD CP-CP None  04/27/2023  4:00 PM Robley Fries, PhD CP-CP None  05/04/2023  2:00 PM [facility]315 MR 3 GI-315MRI [facility]  05/04/2023  2:40 PM GI-315 MR 3 GI-315MRI [facility]  05/11/2023  4:00 PM Robley Fries, PhD CP-CP None  05/25/2023  4:00 PM Robley Fries, PhD CP-CP None    No orders of the defined types were placed in this encounter.      -------------------------------

## 2023-04-01 NOTE — Patient Instructions (Signed)
Start B12 vitamin 5000 mcg daily for low B12

## 2023-04-06 ENCOUNTER — Telehealth: Payer: Self-pay | Admitting: Neurology

## 2023-04-06 NOTE — Telephone Encounter (Signed)
-----   Message from [facility] sent at 04/03/2023 10:25 AM EDT ----- B12 level is low (can be associated with neuropathy) and recommend oral B12 supplement 1000 mcg daily and follow-up with PCP.  A1c is also slightly borderline elevated.  Other labs are normal.  -VRP

## 2023-04-06 NOTE — Telephone Encounter (Signed)
Called the patient to review. There was no answer. Left a detailed message (per DPR). Advised the pt Dr Marjory Lies reviewed the blood work and the B12 was low and A1c was borderline.  Advised on VM that both can cause neuropathy symptoms. Advised he recommends B12 sublingual 1000 mcg daily. Informed to follow up with blood work with PCP. Instructed the pt to call back with questions.

## 2023-04-13 ENCOUNTER — Ambulatory Visit (INDEPENDENT_AMBULATORY_CARE_PROVIDER_SITE_OTHER): Payer: BC Managed Care – PPO | Admitting: Psychiatry

## 2023-04-13 DIAGNOSIS — F5104 Psychophysiologic insomnia: Secondary | ICD-10-CM

## 2023-04-13 DIAGNOSIS — F3131 Bipolar disorder, current episode depressed, mild: Secondary | ICD-10-CM | POA: Diagnosis not present

## 2023-04-13 DIAGNOSIS — Z638 Other specified problems related to primary support group: Secondary | ICD-10-CM

## 2023-04-13 DIAGNOSIS — G4730 Sleep apnea, unspecified: Secondary | ICD-10-CM

## 2023-04-13 DIAGNOSIS — Z62820 Parent-biological child conflict: Secondary | ICD-10-CM

## 2023-04-13 DIAGNOSIS — F411 Generalized anxiety disorder: Secondary | ICD-10-CM

## 2023-04-13 DIAGNOSIS — G4710 Hypersomnia, unspecified: Secondary | ICD-10-CM

## 2023-04-13 DIAGNOSIS — R69 Illness, unspecified: Secondary | ICD-10-CM

## 2023-04-13 NOTE — Progress Notes (Signed)
Psychotherapy Progress Note [facility] [name], PhD LP  Patient ID: Johnny [name])    MRN: [id number] Therapy format: Individual psychotherapy Date: 04/13/2023      Start: 3:14p     Stop: 4:02p     Time Spent: 48 min Location: In-person   Session narrative (presenting needs, interim history, self-report of stressors and symptoms, applications of prior therapy, status changes, and interventions made in session) Underslept, cites [name]'s snoring, suspicion of sleep apnea.  Has had to deal with broken nights, some hours on the couch to get away from the noise.  Good news that [name]'s liver lesions have shrunk.  Heide Spark will be this Thursday, 3 days from now.  Just off a visit with [name], where he saw the new house first time, broke the news of marrying, and [name] was excited, to his relief.  Construction of the walk in tub finishes today.  No falls lately.  Still on the MRI list, moved up.  Re. relationship with [name], been hitting some conflict of late, fielding comments that sound bossy, and her bristling some.  Support/empathy provided.  Supportively challenged taking [name]'s moods personally, encouraged to recognize that she is simultaneously in frequent pain, out of her usual element, and getting ready to become more vulnerable again by marrying.  Therapeutic modalities: Cognitive Behavioral Therapy, Solution-Oriented/Positive Psychology, and Ego-Supportive  Mental Status/Observations:  Appearance:   Casual     Behavior:  Appropriate  Motor:  Affected by ortho pain  Speech/Language:   Clear and Coherent  Affect:  Appropriate  Mood:  normal  Thought process:  normal  Thought content:    WNL  Sensory/Perceptual disturbances:    WNL  Orientation:  Fully oriented  Attention:  Good    Concentration:  Good  Memory:  WNL  Insight:    Fair  Judgment:   Good  Impulse Control:  Good   Risk Assessment: Danger to Self: No Self-injurious Behavior:  No Danger to Others: No Physical Aggression / Violence: No Duty to Warn: No Access to Firearms a concern: No  Assessment of progress:  stabilized  Diagnosis:   ICD-10-CM   1. Bipolar affective disorder, currently depressed, mild (HCC)  F31.31     2. Generalized anxiety disorder  F41.1     3. Sleep apnea with hypersomnolence  G47.10    G47.30     4. Psychophysiological insomnia  F51.04     5. Suspected lumbar stenosis  [name]     6. Relationship problem between parent and child  Z62.820     7. Relationship problem with family member  Z63.8      Plan:  Acute ortho/neuro issue -- Sounds likely to be spinal stenosis, but hopefully more remediable issue with PT.  Ensure adequate understanding of medical recommendations and discussion if concerned, before deciding against recommended assessment and treatment, don't assume self-defeating or doubtful things. Bereavement -- Continue working through estate tasks, if any remain.  Where needed, notice and forgive [name].  Make free use of talking to her imaginally, if it will get things off his head or heart.  Where needed imagine her own reassuring that she is OK and nothing owed to her now.  Refresh promises of faith for himself, as he did for her in life.  Griefshare program available if he regains interest. Support system -- Maintain personal support with [name] and any available friends.  Work through [name], television/film/video how to live together constructively, watch for times  when it may feel like dealing with an ex and be willing to check perceptions under stress. [name]'s care -- Continue to work with Medicaid, [name], and [name] as appropriate.  Stay open to positive developments, ask before assuming the worse, and use calls and visits to foster more mature working relationship.  Look to future visits with him to practice coping with transitions and attachment to two homes. Family conflict -- Self-affirm good relationship with [name],  continue retiring resentments with [name], focus on what he's building with [name] and welcoming the next generation.  S about allowing news to reach [name] and subdue worry. Other health care -- May need to reexamine sleep apnea and treatment or other interferences with sleep.  Follow through tending to teeth.  Maintain abstinence from alcohol and drugs.  Stretching and reasonable exercise for orthopedic issues and weight. Other recommendations/advice -- As may be noted above.  Continue to utilize previously learned skills ad lib. Medication compliance -- Maintain medication as prescribed and work faithfully with relevant prescriber(s) if any changes are desired or seem indicated. Crisis service -- Aware of call list and work-in appts.  Call the clinic on-call service, 988/hotline, 911, or present to [facility] or ER if any life-threatening psychiatric crisis. Followup -- Return for time as already scheduled.  Next scheduled visit with me 04/27/2023.  Next scheduled in this office 04/27/2023.  Robley Fries, PhD [name], PhD LP Clinical Psychologist, [facility] [address] [place], [place] [address] [phone]

## 2023-04-15 ENCOUNTER — Other Ambulatory Visit: Payer: Self-pay | Admitting: Psychiatry

## 2023-04-15 DIAGNOSIS — F411 Generalized anxiety disorder: Secondary | ICD-10-CM

## 2023-04-15 NOTE — Telephone Encounter (Signed)
Rf inappropriate; rf 04/01/23 Nv 05/27/23

## 2023-04-27 ENCOUNTER — Ambulatory Visit: Payer: BC Managed Care – PPO | Admitting: Psychiatry

## 2023-04-27 DIAGNOSIS — Z638 Other specified problems related to primary support group: Secondary | ICD-10-CM

## 2023-04-27 DIAGNOSIS — G4710 Hypersomnia, unspecified: Secondary | ICD-10-CM | POA: Diagnosis not present

## 2023-04-27 DIAGNOSIS — G3184 Mild cognitive impairment, so stated: Secondary | ICD-10-CM

## 2023-04-27 DIAGNOSIS — F411 Generalized anxiety disorder: Secondary | ICD-10-CM | POA: Diagnosis not present

## 2023-04-27 DIAGNOSIS — F3131 Bipolar disorder, current episode depressed, mild: Secondary | ICD-10-CM | POA: Diagnosis not present

## 2023-04-27 DIAGNOSIS — G4730 Sleep apnea, unspecified: Secondary | ICD-10-CM

## 2023-04-27 DIAGNOSIS — R69 Illness, unspecified: Secondary | ICD-10-CM

## 2023-04-27 NOTE — Progress Notes (Signed)
Psychotherapy Progress Note [facility] Johnny Czar, PhD LP  Patient ID: Johnny [name])    MRN: [id number] Therapy format: Individual psychotherapy Date: 04/27/2023      Start: 4:18p     Stop: 5:08p     Time Spent: 50 min Location: In-person   Session narrative (presenting needs, interim history, self-report of stressors and symptoms, applications of prior therapy, status changes, and interventions made in session) Now married, as of 10/10.  Was able to have his respected pastor and friend Johnny [name] officiate, and found a pleasant, meaningful, and affordable option to have the ceremony in [place].  Ironic, too, that the venue was basically in sight of ex-W Johnny [name]'s house, but says none of it was done with any feelings or intentions to send her a message, just wanted to be sure she did not disrupt it (an idea that seems to have been overvalued).  Based on prior consideration, has gotten earplugs to cope better with Johnny [name]'s snoring.  She's also been in increased pain from her liver disease the past week and a half.  Unexpected utterance lately about regretting getting married, and gaining weight, turned out she was referring to her first husband.     Dreamt last night of visiting his mother at her apartment, and being angry with her, which bothered him on multiple levels.  Discussed and interpreted more as an attempt to work through unresolved feelings and confront in a more healthy way how angry he became toward her in real life.  Encouraged further forgiveness of himself, as well as her for becoming such a challenge as she was.  Next Monday has MRI.  Walking better, not having his knees go weak on him lately.  Been working on stretching some.  Encouraged in further flexibility as tolerated, and advice on movement and strengthening to help his condition.  Therapeutic modalities: Cognitive Behavioral Therapy, Solution-Oriented/Positive Psychology, and  Ego-Supportive  Mental Status/Observations:  Appearance:   Casual     Behavior:  Appropriate  Motor:  Affected by pain  Speech/Language:   Clear and Coherent  Affect:  Appropriate  Mood:  dysthymic  Thought process:  normal  Thought content:    WNL  Sensory/Perceptual disturbances:    WNL  Orientation:  Fully oriented  Attention:  Good    Concentration:  Good  Memory:  WNL  Insight:    Fair  Judgment:   Good  Impulse Control:  Good   Risk Assessment: Danger to Self: No Self-injurious Behavior: No Danger to Others: No Physical Aggression / Violence: No Duty to Warn: No Access to Firearms a concern: No  Assessment of progress:  stabilized  Diagnosis:   ICD-10-CM   1. Bipolar affective disorder, currently depressed, mild (HCC)  F31.31     2. Generalized anxiety disorder  F41.1     3. Sleep apnea with hypersomnolence  G47.10    G47.30     4. Suspected lumbar stenosis  [name]     5. Relationship problem with family member  Z63.8     6. Mild cognitive impairment  G31.84      Plan:  Acute ortho/neuro issue -- Sounds likely to be spinal stenosis, but hopefully a more remediable issue with PT.  Ensure adequate understanding of medical recommendations and discussion if concerned, before deciding against recommended assessment and treatment, don't assume self-defeating or doubtful things. Bereavement -- Continue working through estate tasks, if any remain.  Where needed, notice and forgive [name] on  his watch.  Make free use of talking to her imaginally, if it will get things off his head or heart.  Where needed imagine her own reassuring that she is OK and nothing owed to her now.  Refresh promises of faith for himself, as he did for her in life.  Griefshare program available if he regains interest. Support system -- Maintain personal support with Johnny [name] and any available friends.  Work through [name], television/film/video how to live together constructively, watch for times when it may feel like  dealing with an ex and be willing to check perceptions under stress, hold off jumping to defensive conclusions. Johnny [name]'s care -- Continue to work with Medicaid, [name], and [name] as appropriate.  Stay open to positive developments, ask before assuming the worse, and use calls and visits to foster more mature working relationship.  Look to future visits with him to practice coping with transitions and attachment to two homes. Family conflict -- Self-affirm good relationship with Johnny [name], continue retiring resentments with Johnny [name], focus on what he's building with Johnny [name] and welcoming the next generation.  S about allowing news to reach Johnny [name] and subdue worry. Other health care -- May need to reexamine sleep apnea and treatment or other interferences with sleep.  Follow through tending to teeth.  Maintain abstinence from alcohol and drugs.  Stretching and reasonable exercise for orthopedic issues and weight. Other recommendations/advice -- As may be noted above.  Continue to utilize previously learned skills ad lib. Medication compliance -- Maintain medication as prescribed and work faithfully with relevant prescriber(s) if any changes are desired or seem indicated. Crisis service -- Aware of call list and work-in appts.  Call the clinic on-call service, 988/hotline, 911, or present to [facility] or ER if any life-threatening psychiatric crisis. Followup -- Return for time as already scheduled.  Next scheduled visit with me 05/11/2023.  Next scheduled in this office 05/11/2023.  Robley Fries, PhD Johnny Czar, PhD LP Clinical Psychologist, [facility] [address] [place], [place] [address] [phone]

## 2023-04-29 ENCOUNTER — Other Ambulatory Visit: Payer: Self-pay | Admitting: Psychiatry

## 2023-04-29 ENCOUNTER — Encounter: Payer: Self-pay | Admitting: Diagnostic Neuroimaging

## 2023-04-29 DIAGNOSIS — F3162 Bipolar disorder, current episode mixed, moderate: Secondary | ICD-10-CM

## 2023-04-29 NOTE — Telephone Encounter (Signed)
Rf inappropriate LF 09/11 WITH #90.   Dose: 400 mg Route: Oral Frequency: Daily at bedtime  Dispense Quantity: 90 tablet Refills: 0        Sig: TAKE 1 TABLET BY MOUTH AT BEDTIME       Start Date: 03/18/23 End Date: --  Written Date: 03/18/23 E

## 2023-05-04 ENCOUNTER — Ambulatory Visit
Admission: RE | Admit: 2023-05-04 | Discharge: 2023-05-04 | Disposition: A | Discharge status: Home / Self Care | Payer: BC Managed Care – PPO | Source: Ambulatory Visit | Attending: Diagnostic Neuroimaging | Admitting: Diagnostic Neuroimaging

## 2023-05-04 DIAGNOSIS — R29898 Other symptoms and signs involving the musculoskeletal system: Secondary | ICD-10-CM

## 2023-05-04 MED ORDER — GADOPICLENOL 0.5 MMOL/ML IV SOLN
10.0000 mL | Freq: Once | INTRAVENOUS | Status: AC | PRN
  Administered 2023-05-04: 10 mL via INTRAVENOUS

## 2023-05-06 ENCOUNTER — Telehealth: Payer: Self-pay

## 2023-05-06 DIAGNOSIS — R29898 Other symptoms and signs involving the musculoskeletal system: Secondary | ICD-10-CM

## 2023-05-06 DIAGNOSIS — M48061 Spinal stenosis, lumbar region without neurogenic claudication: Secondary | ICD-10-CM

## 2023-05-06 NOTE — Telephone Encounter (Signed)
Contacted pt, informed him he has severe spinal stenosis. MD recommend neurosurgery spine consult if he agrees for eval and tx. Pt agreed to consult, referral placed.  Will mail copy of MRI Lumbar to pt, per his rq.

## 2023-05-06 NOTE — Telephone Encounter (Signed)
-----   Message from Glenford Bayley [facility] sent at 05/06/2023  3:01 PM EDT ----- Severe spinal stenosis. Recommend neurosurgery spine consult if he agrees for eval and tx. -VRP

## 2023-05-07 ENCOUNTER — Telehealth: Payer: Self-pay | Admitting: Diagnostic Neuroimaging

## 2023-05-07 NOTE — Telephone Encounter (Signed)
Referral for neurosurgery fax to [facility]. Phone: [phone], Fax: [phone] 

## 2023-05-08 ENCOUNTER — Telehealth: Payer: Self-pay

## 2023-05-08 NOTE — Telephone Encounter (Signed)
Patient called.  Left message for patient to call back.

## 2023-05-08 NOTE — Telephone Encounter (Signed)
-----   Message from Glenford Bayley [facility] sent at 05/06/2023  3:58 PM EDT ----- Overall unremarkable imaging results.  No cause for bilateral leg pain found.  (Incidentally noted is mild atrophy and small right basal ganglia infarct that could be within the spectrum of small vessel ischemic disease versus benign infraputaminal cyst. Continue current meds.) Continue current plan. -VRP

## 2023-05-11 ENCOUNTER — Telehealth: Payer: Self-pay | Admitting: Diagnostic Neuroimaging

## 2023-05-11 ENCOUNTER — Ambulatory Visit (INDEPENDENT_AMBULATORY_CARE_PROVIDER_SITE_OTHER): Payer: BC Managed Care – PPO | Admitting: Psychiatry

## 2023-05-11 DIAGNOSIS — M48061 Spinal stenosis, lumbar region without neurogenic claudication: Secondary | ICD-10-CM

## 2023-05-11 DIAGNOSIS — F3131 Bipolar disorder, current episode depressed, mild: Secondary | ICD-10-CM

## 2023-05-11 DIAGNOSIS — G4710 Hypersomnia, unspecified: Secondary | ICD-10-CM

## 2023-05-11 DIAGNOSIS — G4730 Sleep apnea, unspecified: Secondary | ICD-10-CM

## 2023-05-11 DIAGNOSIS — G3184 Mild cognitive impairment, so stated: Secondary | ICD-10-CM

## 2023-05-11 DIAGNOSIS — F411 Generalized anxiety disorder: Secondary | ICD-10-CM | POA: Diagnosis not present

## 2023-05-11 DIAGNOSIS — Z638 Other specified problems related to primary support group: Secondary | ICD-10-CM

## 2023-05-11 NOTE — Telephone Encounter (Signed)
Called the pt back. There was no answer. Left a message asking for the pt to return call.  **If pt returns call please advise that Dr Minette Headland reviewed the MRI of the brain results. This overall looked good and there was nothing that was present on the brain that would be cause for the bilateral leg pain.  Pt has already been made aware of the MRI lumbar spine which could play a role into his symptoms and the referral to neurosurgery has been placed already.  Per Dr Marjory Lies: overall unremarkable imaging results.  No cause for bilateral leg pain found.  (Incidentally noted is mild atrophy and small right basal ganglia infarct that could be within the spectrum of small vessel ischemic disease versus benign infraputaminal cyst. Continue current meds.) Continue current plan. -VRP

## 2023-05-11 NOTE — Progress Notes (Signed)
Psychotherapy Progress Note [facility] [name], PhD LP  Patient ID: Johnny [name])    MRN: [id number] Therapy format: Individual psychotherapy Date: 05/11/2023      Start: 4:15p     Stop: 5:15p     Time Spent: 60 min Location: In-person   Session narrative (presenting needs, interim history, self-report of stressors and symptoms, applications of prior therapy, status changes, and interventions made in session) Both back and brain MRIs done, got back word that he has severe lumbar stenosis and will need neurosurgery.  Frustrated with communications about his brain MRI, but it turns out he is in the midst of phone tag about it as we speak.  Facilitated call to Johnny [name], caught up his brain results (benign findings, nothing to do with his leg pain, just evidence of a tiny basal infarct we figure is inconsequential).    Talked to Johnny [name] again, learned Johnny [name] informed him about the baby coming.  He knows about the engagement, but not yet about wedding having happened.  Has cogent questions about holiday plans, unable to answer him as yet.  On the agenda to update will, too, now that he is married.  Encouraged to go ahead and catch Johnny [name] up on the news, fear less what he'll think, as he seems to be favorable to Johnny [name] being happy again.  Question about something that happened last week, with Johnny [name] calling for his SSN.  Turned out she found out she can get 1/2 his SS benefit, which of course got him thinking she's trying to manipulate his retirement money and illegally take some of it for herself.  Explained that it is a government benefit to her which does not come out of his funds nor affect his benefits in any way, which he eventually accepted.  Supportively challenged to notice he still tends to presume negative motives despite allegedly being finished with her and over the marriage gone bad.  Encouraged to press into his new marriage more, retired and  forgive  Can still cry, seemingly out of nowhere, but attrib jointly to grief for Johnny [name], poignant changes like the baby coming, and getting through stress.  Normalized.  Johnny [name], for her part, continues in pain with her liver condition (NASH) and still is expected to need a liver transplant, though uncertain where she stands, and how much can be done until she can receive.  Support/empathy provided.   Therapeutic modalities: Cognitive Behavioral Therapy, Solution-Oriented/Positive Psychology, and Ego-Supportive  Mental Status/Observations:  Appearance:   Casual     Behavior:  Appropriate  Motor:  Normal and slowed by pain  Speech/Language:   Clear and Coherent  Affect:  Appropriate  Mood:  dysthymic  Thought process:  normal  Thought content:    WNL  Sensory/Perceptual disturbances:    WNL  Orientation:  Fully oriented  Attention:  Good    Concentration:  Good  Memory:  WNL  Insight:    Fair  Judgment:   Good  Impulse Control:  Good   Risk Assessment: Danger to Self: No Self-injurious Behavior: No Danger to Others: No Physical Aggression / Violence: No Duty to Warn: No Access to Firearms a concern: No  Assessment of progress:  stabilized  Diagnosis:   ICD-10-CM   1. Bipolar affective disorder, currently depressed, mild (HCC)  F31.31     2. Generalized anxiety disorder  F41.1     3. Relationship problem with family member  Z63.8     4.  Sleep apnea with hypersomnolence  G47.10    G47.30     5. Mild cognitive impairment  G31.84     6. Spinal stenosis of lumbar region, unspecified whether neurogenic claudication present  M48.061      Plan:  Acute ortho/[name] issue -- Sounds likely to be spinal stenosis, but hopefully a more remediable issue with PT.  Ensure adequate understanding of medical recommendations and discussion if concerned, before deciding against recommended assessment and treatment, don't assume self-defeating or doubtful things. Bereavement -- Continue working  through estate tasks, if any remain.  Where needed, notice and forgive Johnny [name] passing on his watch.  Make free use of talking to her imaginally, if it will get things off his head or heart.  Where needed imagine her own reassuring that she is OK and nothing owed to her now.  Refresh promises of faith for himself, as he did for her in life.  Griefshare program available if he regains interest. Support system -- Maintain personal support with Johnny [name] and any available friends.  Work through [name], television/film/video how to live together constructively, watch for times when it may feel like dealing with an ex and be willing to check perceptions under stress, hold off jumping to defensive conclusions. Johnny [name]'s care -- Continue to work with Medicaid, [name], and the Johnny [name] as appropriate.  Stay open to positive developments, ask before assuming the worse, and use calls and visits to foster more mature working relationship.  Look to future visits with him to practice coping with transitions and attachment to two homes. Family conflict -- Self-affirm good relationship with Johnny [name], continue retiring resentments with Johnny [name], focus on what he's building with Johnny [name] and welcoming the next generation.  S about allowing news to reach Johnny [name] and subdue worry. Other health care -- May need to reexamine sleep apnea and treatment or other interferences with sleep.  Follow through tending to teeth.  Maintain abstinence from alcohol and drugs.  Stretching and reasonable exercise for orthopedic issues and weight. Other recommendations/advice -- As may be noted above.  Continue to utilize previously learned skills ad lib. Medication compliance -- Maintain medication as prescribed and work faithfully with relevant prescriber(s) if any changes are desired or seem indicated. Crisis service -- Aware of call list and work-in appts.  Call the clinic on-call service, 988/hotline, 911, or present to Johnny [name] or ER if any life-threatening psychiatric crisis. Followup  -- Return for time as already scheduled.  Next scheduled visit with me 05/25/2023.  Next scheduled in this office 05/25/2023.  Robley Fries, PhD [name], PhD LP Clinical Psychologist, [facility] [address] [place], [place] [address] [phone]

## 2023-05-11 NOTE — Telephone Encounter (Signed)
Pt is asking for a call to discuss results to the brain MRI

## 2023-05-25 ENCOUNTER — Ambulatory Visit (INDEPENDENT_AMBULATORY_CARE_PROVIDER_SITE_OTHER): Payer: BC Managed Care – PPO | Admitting: Psychiatry

## 2023-05-25 ENCOUNTER — Institutional Professional Consult (permissible substitution): Payer: Self-pay | Admitting: Diagnostic Neuroimaging

## 2023-05-25 DIAGNOSIS — F411 Generalized anxiety disorder: Secondary | ICD-10-CM | POA: Diagnosis not present

## 2023-05-25 DIAGNOSIS — R69 Illness, unspecified: Secondary | ICD-10-CM

## 2023-05-25 DIAGNOSIS — Z62820 Parent-biological child conflict: Secondary | ICD-10-CM | POA: Diagnosis not present

## 2023-05-25 DIAGNOSIS — M48061 Spinal stenosis, lumbar region without neurogenic claudication: Secondary | ICD-10-CM

## 2023-05-25 DIAGNOSIS — G3184 Mild cognitive impairment, so stated: Secondary | ICD-10-CM

## 2023-05-25 DIAGNOSIS — F3131 Bipolar disorder, current episode depressed, mild: Secondary | ICD-10-CM | POA: Diagnosis not present

## 2023-05-25 DIAGNOSIS — Z638 Other specified problems related to primary support group: Secondary | ICD-10-CM

## 2023-05-25 NOTE — Progress Notes (Signed)
Psychotherapy Progress Note [facility] [name], PhD LP  Patient ID: Johnny [name])    MRN: [id number] Therapy format: Individual psychotherapy Date: 05/25/2023      Start: 4:20p     Stop: 5:10p     Time Spent: 50 min Location: In-person   Session narrative (presenting needs, interim history, self-report of stressors and symptoms, applications of prior therapy, status changes, and interventions made in session) Had Johnny [name] for 2 nights this weekend, first time with Johnny [name], all three of them in the house, fist in the new house, and he's happy to have a stepmom.  Johnny [name] had her baby, Johnny [name], touching to be a granddad.    Johnny [name] is showing low platelets and low white count, getting recommended to hospital for endoscopy.  Johnny [name] wonders if he could donate part of his own liver.  Meanwhile, his stenosis continues to pain him.  Accommodating Johnny [name]'s cat, who, with his acute sense of smell, affects him from time to time.    Has remembered orientation to the Social Security issue, trusts that Johnny [name] is not taking his retirement now.  News that Johnny [name] is getting a TKR.  No particular impact on his life or hopes, no expectation he'll be leaned on, just remembers what it was like for him and his history of failed operation.  Therapeutic modalities: Cognitive Behavioral Therapy, Solution-Oriented/Positive Psychology, and Ego-Supportive  Mental Status/Observations:  Appearance:   Casual     Behavior:  Appropriate  Motor:  Normal  Speech/Language:   Clear and Coherent  Affect:  Appropriate  Mood:  dysthymic and brighter  Thought process:  normal  Thought content:    WNL  Sensory/Perceptual disturbances:    WNL  Orientation:  Fully oriented  Attention:  Good    Concentration:  Good  Memory:  WNL  Insight:    Fair  Judgment:   Good  Impulse Control:  Good   Risk Assessment: Danger to Self: No Self-injurious Behavior: No Danger to Others: No Physical Aggression / Violence:  No Duty to Warn: No Access to Firearms a concern: No  Assessment of progress:  stabilized  Diagnosis:   ICD-10-CM   1. Bipolar affective disorder, currently depressed, mild (HCC)  F31.31     2. Generalized anxiety disorder  F41.1     3. Mild cognitive impairment  G31.84     4. Relationship problem between parent and child  Z62.820     5. Relationship problem with family member  Z63.8     6. Spinal stenosis of lumbar region, unspecified whether neurogenic claudication present  M48.061      Plan:  Acute ortho/neuro issue -- Sounds likely to be spinal stenosis, but hopefully a more remediable issue with PT.  Ensure adequate understanding of medical recommendations and discussion if concerned, before deciding against recommended assessment and treatment, don't assume self-defeating or doubtful things. Bereavement -- Continue working through estate tasks, if any remain.  Where needed, notice and forgive [name].  Make free use of talking to her imaginally, if it will get things off his head or heart.  Where needed imagine her own reassuring that she is OK and nothing owed to her now.  Refresh promises of faith for himself, as he did for her in life.  Griefshare program available if he regains interest. Support system -- Maintain personal support with Johnny [name] and any available friends.  Work through [name], television/film/video how to live together constructively, watch for times when it may  feel like dealing with an ex and be willing to check perceptions under stress, hold off jumping to defensive conclusions. Johnny [name] -- Continue to work with Medicaid, [name], and [name] as appropriate.  Stay open to positive developments, ask before assuming the worse, and use calls and visits to foster more mature working relationship.  Look to future visits with him to practice coping with transitions and attachment to two homes. Family conflict -- Self-affirm good relationship with Johnny [name], continue  retiring resentments with Johnny [name], focus on what he's building with Johnny [name] and welcoming the next generation.  S about allowing news to reach Johnny [name] and subdue worry. Other health [name] -- May need to reexamine sleep apnea and treatment or other interferences with sleep.  Follow through tending to teeth.  Maintain abstinence from alcohol and drugs.  Stretching and reasonable exercise for orthopedic issues and weight. Other recommendations/advice -- As may be noted above.  Continue to utilize previously learned skills ad lib. Medication compliance -- Maintain medication as prescribed and work faithfully with relevant prescriber(s) if any changes are desired or seem indicated. Crisis service -- Aware of call list and work-in appts.  Call the clinic on-call service, 988/hotline, 911, or present to [facility] or ER if any life-threatening psychiatric crisis. Followup -- Return for time as already scheduled.  Next scheduled visit with me 06/08/2023.  Next scheduled in this office 05/27/2023.  Robley Fries, PhD [name], PhD LP Clinical Psychologist, [facility] [address], [address] [place], [place] [address] [phone]

## 2023-05-27 ENCOUNTER — Ambulatory Visit (INDEPENDENT_AMBULATORY_CARE_PROVIDER_SITE_OTHER): Payer: BC Managed Care – PPO | Admitting: Psychiatry

## 2023-05-27 ENCOUNTER — Encounter: Payer: Self-pay | Admitting: Psychiatry

## 2023-05-27 DIAGNOSIS — R7989 Other specified abnormal findings of blood chemistry: Secondary | ICD-10-CM

## 2023-05-27 DIAGNOSIS — F3162 Bipolar disorder, current episode mixed, moderate: Secondary | ICD-10-CM

## 2023-05-27 DIAGNOSIS — G4710 Hypersomnia, unspecified: Secondary | ICD-10-CM | POA: Diagnosis not present

## 2023-05-27 DIAGNOSIS — F3131 Bipolar disorder, current episode depressed, mild: Secondary | ICD-10-CM

## 2023-05-27 DIAGNOSIS — F411 Generalized anxiety disorder: Secondary | ICD-10-CM

## 2023-05-27 DIAGNOSIS — F5105 Insomnia due to other mental disorder: Secondary | ICD-10-CM

## 2023-05-27 DIAGNOSIS — G4730 Sleep apnea, unspecified: Secondary | ICD-10-CM

## 2023-05-27 MED ORDER — OXCARBAZEPINE 300 MG PO TABS: ORAL_TABLET | ORAL | 5 refills | Status: DC

## 2023-05-27 MED ORDER — QUETIAPINE FUMARATE 400 MG PO TABS: 400.0000 mg | ORAL_TABLET | Freq: Every day | ORAL | 0 refills | Status: DC

## 2023-05-27 MED ORDER — LORAZEPAM 1 MG PO TABS: 1.0000 mg | ORAL_TABLET | Freq: Three times a day (TID) | ORAL | 1 refills | Status: DC | PRN

## 2023-05-27 NOTE — Progress Notes (Signed)
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 06/26/2016 1521   MCH 29.5 06/26/2016 1521   MCHC 35.8 06/26/2016 1521   RDW 12.6 06/26/2016 1521   LYMPHSABS 0.6 (L) 06/26/2016 1521   MONOABS 0.6 06/26/2016 1521   EOSABS 0.0 06/26/2016 1521   BASOSABS 0.0 06/26/2016 1521    No results found for: "POCLITH", "LITHIUM"   No results found for: "PHENYTOIN", "PHENOBARB", "VALPROATE", "CBMZ"   03/18/23 B12 low 215 [phone])  .res Assessment: Plan:    Bipolar affective disorder, currently depressed, mild (HCC)  Generalized anxiety disorder - Plan: LORazepam (ATIVAN) 1 MG tablet  Moderate mixed bipolar I disorder (HCC) - Plan: Oxcarbazepine (TRILEPTAL) 300 MG tablet, QUEtiapine (SEROQUEL) 400 MG tablet  Sleep apnea with hypersomnolence  Insomnia due to mental condition  Low vitamin B12 level   30 min face to face time with patient.  We discussed Chronic depression, anxiety, insomnia, stress is ongoing and worse lately. Has never achieved freedom from symptoms.  No significant anger outbursts currently.  No unusual mood swings. Chronic severe stress dealing with severely mentally ill violent son and recent death of mother.  But much better  affect and mood with recent positive changes.  We discussed his high dosages and polypharmacy that are medically necessary.  Disc SE risks esp sedation. Anger is better.   Stress with exW is better..  We discussed the short-term risks associated with benzodiazepines including  sedation and increased fall risk among others.  Discussed long-term side effect risk including dependence, potential withdrawal symptoms, and the potential eventual dose-related risk of dementia.  But recent studies from 2020 dispute this association between benzodiazepines and dementia risk. Newer studies in 2020 do not support an association with dementia.  Disc in detail concerns about tolerance which may be developing. No SE  Discussed potential metabolic side effects associated with atypical antipsychotics, as well as potential risk for movement side effects. Advised pt to contact office if movement side effects occur.  Disc risk sedation with current med regimen.  Consider Auvelity for chronic depression with different mechanism.  Discussed the polypharmacy which is not ideal but necessary.  He is taking .  DT stress can't go down further with meds.  Disc SE.  Lorazepam doesn't make him sleepy  No med changes Lamotrigine 100 TID and 200 HS for bipolar depression,  lorazepam for anxiety 1 mg TID  oxcarbazepine 300 mg tablets 2 every morning and 4 nightly for bipolar disorder,  clonidine 0.2 mg BID for  off label for anxiety and mixed bipolar sx and should also help htn noted.,  continue Seroquel 400 mg .  Increase helped.  Needs to start B12 for low level 215.  This might help energy, strength , and cognition.   Hx low sodium too in 2017 Next blood draw include B12  and BMP to ensure sodium level is normal.  Dr Wynelle Link has checked this before  Disc ED and meds for it.  Disc [facility].  Sildenafil 100 tolerated prn.  Pending neuro about leg weakness Has some sciatica  FU 2 mos for support  Meredith Staggers MD,  DFAPA  Please see After Visit Summary for patient specific instructions.  Future Appointments  Date Time Provider Department Center  06/08/2023  1:00 PM Robley Fries, PhD [facility] None  06/22/2023  2:00 PM Robley Fries, PhD [facility] None  07/06/2023  4:00 PM Robley Fries, PhD [facility] None    No orders of the defined types were placed in this encounter.      -------------------------------

## 2023-06-08 ENCOUNTER — Ambulatory Visit (INDEPENDENT_AMBULATORY_CARE_PROVIDER_SITE_OTHER): Payer: BC Managed Care – PPO | Admitting: Psychiatry

## 2023-06-08 DIAGNOSIS — G4710 Hypersomnia, unspecified: Secondary | ICD-10-CM

## 2023-06-08 DIAGNOSIS — F3131 Bipolar disorder, current episode depressed, mild: Secondary | ICD-10-CM

## 2023-06-08 DIAGNOSIS — M48061 Spinal stenosis, lumbar region without neurogenic claudication: Secondary | ICD-10-CM

## 2023-06-08 DIAGNOSIS — F411 Generalized anxiety disorder: Secondary | ICD-10-CM

## 2023-06-08 DIAGNOSIS — G4730 Sleep apnea, unspecified: Secondary | ICD-10-CM

## 2023-06-08 DIAGNOSIS — G3184 Mild cognitive impairment, so stated: Secondary | ICD-10-CM

## 2023-06-08 DIAGNOSIS — R7989 Other specified abnormal findings of blood chemistry: Secondary | ICD-10-CM

## 2023-06-08 DIAGNOSIS — Z638 Other specified problems related to primary support group: Secondary | ICD-10-CM

## 2023-06-08 NOTE — Progress Notes (Signed)
Psychotherapy Progress Note [facility] [name], PhD LP  Patient ID: Johnny [name])    MRN: [id number] Therapy format: Individual psychotherapy Date: 06/08/2023      Start: 1:02p     Stop: 1:50p     Time Spent: 48 min Location: In-person   Session narrative (presenting needs, interim history, self-report of stressors and symptoms, applications of prior therapy, status changes, and interventions made in session) Recent med check, noted 400mg  Seroquel effective for sleep, continuing to address spinal stenosis and sciatica with neurosurg consult tomorrow.  Clonidine TID and lorazepam TID prn, plus oxcarbazepine qAM continue for anxiety management and mood stabilization.  Auvelity under consideration for helping resolve polypharmacy risks and persistent depression.  Low B12 persists, recommended supplement and retest via PCP.  Anger generally better controlled for some time (attribute largely to working out stressful conditions with son's behavior, mother's end of life care, and loneliness being divorced).    Went to [place] for TG, met some of [name]'s extended family.  Travel went OK despite the cold, ate graciously.  Had one moment where [name] accused him of snapping at her in front of relatives, risking a fight as he felt defensive about it.  Blew over OK, got an apology for being cross.  On review, aware that she has some negative memories in [place] from family life, and relatives she means to avoid.  Alongside her liver condition and the pain it causes, probably explains a lot.  Working out plans for [employer] now, with some rivalry implied about who to spend time with.  Clear that he means to prioritize hosting [name] again, seeing daughter and grandchild.  Encouraged to work out expectations well ahead of time, stay gracious about what [name]'s wishes might be, and any overtones of her being the one to be most away from her own people.  With his stenosis, has had another fall  recently.  Confirms neurosurg consult tomorrow.  Support/empathy provided.   Confirms he is on OTC B12 1/D.  Briefly educated on B12 vs. B complex an benefits of supplementing adequately.  Therapeutic modalities: Cognitive Behavioral Therapy, Solution-Oriented/Positive Psychology, Ego-Supportive, and Psycho-education/Bibliotherapy  Mental Status/Observations:  Appearance:   Casual     Behavior:  Appropriate  Motor:  Normal and exc pain  Speech/Language:   Clear and Coherent  Affect:  Appropriate  Mood:  normal  Thought process:  normal  Thought content:    WNL  Sensory/Perceptual disturbances:    WNL  Orientation:  Fully oriented  Attention:  Good    Concentration:  Good  Memory:  WNL  Insight:    Fair  Judgment:   Good  Impulse Control:  Good   Risk Assessment: Danger to Self: No Self-injurious Behavior: No Danger to Others: No Physical Aggression / Violence: No Duty to Warn: No Access to Firearms a concern: No  Assessment of progress:  stabilized  Diagnosis:   ICD-10-CM   1. Bipolar affective disorder, currently depressed, mild (HCC)  F31.31     2. Generalized anxiety disorder  F41.1     3. Mild cognitive impairment  G31.84     4. Sleep apnea with hypersomnolence  G47.10    G47.30     5. Low vitamin B12 level  R79.89     6. Relationship problem with family member  Z63.8     7. Spinal stenosis of lumbar region, unspecified whether neurogenic claudication present  M48.061      Plan:  Acute ortho/neuro issue --  Sounds likely to be spinal stenosis, but hopefully a more remediable issue with PT.  Ensure adequate understanding of medical recommendations and discussion if concerned, before deciding against recommended assessment and treatment, don't assume self-defeating or doubtful things. Bereavement -- Continue working through estate tasks, if any remain.  Where needed, notice and forgive [name].  Make free use of talking to her imaginally, if it  will get things off his head or heart.  Where needed imagine her own reassuring that she is OK and nothing owed to her now.  Refresh promises of faith for himself, as he did for her in life.  Griefshare program available if he regains interest. Support system -- Maintain personal support with [name] and any available friends.  Work through [name], television/film/video how to live together constructively, watch for times when it may feel like dealing with an ex and be willing to check perceptions under stress, hold off jumping to defensive conclusions. [name]'s care -- Continue to work with Medicaid, [name], and [name] as appropriate.  Stay open to positive developments, ask before assuming the worse, and use calls and visits to foster more mature working relationship.  Look to future visits with him to practice coping with transitions and attachment to two homes. Family conflict -- Self-affirm good relationship with [name], continue retiring resentments with [name], focus on what he's building with [name] and welcoming the next generation.  S about allowing news to reach [name] and subdue worry. Other health care -- May need to reexamine sleep apnea and treatment or other interferences with sleep.  Follow through tending to teeth.  Maintain abstinence from alcohol and drugs.  Stretching and reasonable exercise for orthopedic issues and weight.  Maintain B12 or B complex supplementation. Other recommendations/advice -- As may be noted above.  Continue to utilize previously learned skills ad lib. Medication compliance -- Maintain medication as prescribed and work faithfully with relevant prescriber(s) if any changes are desired or seem indicated. Crisis service -- Aware of call list and work-in appts.  Call the clinic on-call service, 988/hotline, 911, or present to [facility] or ER if any life-threatening psychiatric crisis. Followup -- Return for time as already scheduled.  Next scheduled visit with me 06/22/2023.  Next scheduled in  this office 06/22/2023.  Robley Fries, PhD [name], PhD LP Clinical Psychologist, [facility] [address], [address] [place], [place] [address] [phone]

## 2023-06-09 DIAGNOSIS — M4316 Spondylolisthesis, lumbar region: Secondary | ICD-10-CM | POA: Diagnosis not present

## 2023-06-22 ENCOUNTER — Ambulatory Visit (INDEPENDENT_AMBULATORY_CARE_PROVIDER_SITE_OTHER): Payer: BC Managed Care – PPO | Admitting: Psychiatry

## 2023-06-22 DIAGNOSIS — Z638 Other specified problems related to primary support group: Secondary | ICD-10-CM

## 2023-06-22 DIAGNOSIS — F3131 Bipolar disorder, current episode depressed, mild: Secondary | ICD-10-CM

## 2023-06-22 DIAGNOSIS — G3184 Mild cognitive impairment, so stated: Secondary | ICD-10-CM | POA: Diagnosis not present

## 2023-06-22 DIAGNOSIS — F411 Generalized anxiety disorder: Secondary | ICD-10-CM | POA: Diagnosis not present

## 2023-06-22 DIAGNOSIS — R7989 Other specified abnormal findings of blood chemistry: Secondary | ICD-10-CM

## 2023-06-22 DIAGNOSIS — M48061 Spinal stenosis, lumbar region without neurogenic claudication: Secondary | ICD-10-CM | POA: Diagnosis not present

## 2023-06-22 NOTE — Progress Notes (Signed)
Psychotherapy Progress Note [facility] [name], PhD LP  Patient ID: Johnny [name])    MRN: [id number] Therapy format: Individual psychotherapy Date: 06/22/2023      Start: 2:12p     Stop: 3:00p     Time Spent: 48 min Location: In-person   Session narrative (presenting needs, interim history, self-report of stressors and symptoms, applications of prior therapy, status changes, and interventions made in session) Confirmed stenosis, scheduling for back surgery now.  Sleeping arrangements still aggravating, with [name]'s snoring and very loud alarm.  Adopted earplugs, which help some.  Christmas plans to have [name] back in for several days.  Not making the trip back to [place], given his back, [name]'s eyes and stamina, and the construction issues on the highway.  Harder couple of weeks with [name] sick and trying to work through her liver-related pain.  Seems to be souring some on [name]'s reactions, which tend to be cynical, or at least unexpectedly snarky, like when he accidentally elbowed her forehead setting up the tree, and she asked if he was trying to kill her for the insurance money.  Another incident where he accidentally hit her head and started to take offense that she (reflexively) said "Son of a bitch".  Trying to stay nonreactive to these but evidently irked and prone to resent.  Encouraged to take those thoughts with a grain of salt, try his best to assume it's not personal, just an exclamation, and actively narrate the difference to himself between her and [name].  Therapeutic modalities: Cognitive Behavioral Therapy, Solution-Oriented/Positive Psychology, and Interpersonal  Mental Status/Observations:  Appearance:   Casual     Behavior:  Appropriate  Motor:  Normal  Speech/Language:   Clear and Coherent  Affect:  Appropriate  Mood:  dysthymic  Thought process:  normal  Thought content:    WNL  Sensory/Perceptual disturbances:    WNL  Orientation:  Fully  oriented  Attention:  Good    Concentration:  Good  Memory:  WNL  Insight:    Fair  Judgment:   Good  Impulse Control:  Good   Risk Assessment: Danger to Self: No Self-injurious Behavior: No Danger to Others: No Physical Aggression / Violence: No Duty to Warn: No Access to Firearms a concern: No  Assessment of progress:  progressing  Diagnosis:   ICD-10-CM   1. Bipolar affective disorder, currently depressed, mild (HCC)  F31.31     2. Generalized anxiety disorder  F41.1     3. Mild cognitive impairment  G31.84     4. Spinal stenosis of lumbar region, unspecified whether neurogenic claudication present  M48.061     5. Relationship problem with family member  Z63.8     6. Low vitamin B12 level  R79.89      Plan:  Acute ortho/neuro issue -- Sounds likely to be spinal stenosis, but hopefully a more remediable issue with PT.  Ensure adequate understanding of medical recommendations and discussion if concerned, before deciding against recommended assessment and treatment, don't assume self-defeating or doubtful things. Bereavement -- Continue working through estate tasks, if any remain.  Where needed, notice and forgive [name].  Make free use of talking to her imaginally, if it will get things off his head or heart.  Where needed imagine her own reassuring that she is OK and nothing owed to her now.  Refresh promises of faith for himself, as he did for her in life.  Griefshare program available if  he regains interest. Support system -- Maintain personal support with [name] and any available friends.   Marital adjustment -- Work through [name], television/film/video how to live together constructively, watch for times when it may feel like dealing with an ex and be willing to check perceptions under stress, hold off jumping to defensive conclusions.  Prioritize allying on practical solutions for issues and refusing to react like rivals. [name]'s care -- Continue to work with Medicaid, [name],  and [name] as appropriate.  Stay open to positive developments, ask before assuming the worse, and use calls and visits to foster more mature working relationship.  Look to future visits with him to practice coping with transitions and attachment to two homes. Family conflict -- Self-affirm good relationship with [name], continue retiring resentments with [name], focus on what he's building with [name] and welcoming the next generation.  S about allowing news to reach [name] and subdue worry. Other health care -- May need to reexamine sleep apnea and treatment or other interferences with sleep.  Follow through tending to teeth.  Maintain abstinence from alcohol and drugs.  Stretching and reasonable exercise for orthopedic issues and weight.  Maintain B12 or B complex supplementation. Other recommendations/advice -- As may be noted above.  Continue to utilize previously learned skills ad lib. Medication compliance -- Maintain medication as prescribed and work faithfully with relevant prescriber(s) if any changes are desired or seem indicated. Crisis service -- Aware of call list and work-in appts.  Call the clinic on-call service, 988/hotline, 911, or present to [facility] or ER if any life-threatening psychiatric crisis. Followup -- Return for time as already scheduled.  Next scheduled visit with me 07/06/2023.  Next scheduled in this office 07/06/2023.  Robley Fries, PhD [name], PhD LP Clinical Psychologist, [facility] [address], [address] [place], [place] [address] [phone]

## 2023-06-29 ENCOUNTER — Other Ambulatory Visit: Payer: Self-pay | Admitting: Neurosurgery

## 2023-07-06 ENCOUNTER — Ambulatory Visit (INDEPENDENT_AMBULATORY_CARE_PROVIDER_SITE_OTHER): Payer: BC Managed Care – PPO | Admitting: Psychiatry

## 2023-07-06 DIAGNOSIS — F3131 Bipolar disorder, current episode depressed, mild: Secondary | ICD-10-CM

## 2023-07-06 DIAGNOSIS — G3184 Mild cognitive impairment, so stated: Secondary | ICD-10-CM | POA: Diagnosis not present

## 2023-07-06 DIAGNOSIS — R7989 Other specified abnormal findings of blood chemistry: Secondary | ICD-10-CM

## 2023-07-06 DIAGNOSIS — F411 Generalized anxiety disorder: Secondary | ICD-10-CM

## 2023-07-06 DIAGNOSIS — M48061 Spinal stenosis, lumbar region without neurogenic claudication: Secondary | ICD-10-CM

## 2023-07-06 DIAGNOSIS — Z638 Other specified problems related to primary support group: Secondary | ICD-10-CM | POA: Diagnosis not present

## 2023-07-06 NOTE — Progress Notes (Signed)
Psychotherapy Progress Note [facility] [name], PhD LP  Patient ID: Johnny [name])    MRN: [id number] Therapy format: Individual psychotherapy Date: 07/06/2023      Start: 4:21p     Stop: 5:10p     Time Spent: 49 min Location: In-person   Session narrative (presenting needs, interim history, self-report of stressors and symptoms, applications of prior therapy, status changes, and interventions made in session) Delayed by traffic today.  Pleasant Christmas visit from Marcelle Smiling has relaxed with him, and racking up some "went well" experiences.  [name]'s liver problem has seemed scarier lately, partly for having had a few days of unexpected vaginal bleeding, which she will get checked out.  Interesting how [name] is using a term of endearment for [name] [employer]) and referred to his mother as "[name]".    [name] has suffered in part for involuntary leave time from work, and [name] has felt a little cramped in lifestyle while she's been home, more vulnerable to caustic comments about how he does chores.  Encouraged further in overlooking negativity as an expression of her pain, growing pains in a new marriage, and his chance fulfill vows, as well as in recruiting more positive interactions and a more courteous way of reacting.  Further confirmed for back surgery to come.  Affirmed and encouraged.  Therapeutic modalities: Cognitive Behavioral Therapy, Solution-Oriented/Positive Psychology, and Ego-Supportive  Mental Status/Observations:  Appearance:   Casual     Behavior:  Appropriate  Motor:  Normal and exc pain-related  Speech/Language:   Clear and Coherent  Affect:  Appropriate  Mood:  dysthymic  Thought process:  normal  Thought content:    WNL  Sensory/Perceptual disturbances:    WNL  Orientation:  Fully oriented  Attention:  Good    Concentration:  Good  Memory:  WNL  Insight:    Fair  Judgment:   Good  Impulse Control:  Good   Risk  Assessment: Danger to Self: No Self-injurious Behavior: No Danger to Others: No Physical Aggression / Violence: No Duty to Warn: No Access to Firearms a concern: No  Assessment of progress:  progressing  Diagnosis:   ICD-10-CM   1. Bipolar affective disorder, currently depressed, mild (HCC)  F31.31     2. Generalized anxiety disorder  F41.1     3. Mild cognitive impairment  G31.84     4. Relationship problem with family member  Z63.8     5. Spinal stenosis of lumbar region, unspecified whether neurogenic claudication present  M48.061     6. Low vitamin B12 level  R79.89      Plan:  Acute ortho/neuro issue -- Ensure adequate understanding of medical recommendations and discussion if concerned, before deciding against recommended assessment and treatment, don't assume self-defeating or doubtful things.  Endorse back surgery as planned, make sure to comprehend treatment recommendations and stretch and hydrate adequately for back health. Bereavement -- Continue working through estate tasks, if any remain.  Where needed, notice and forgive [name].  Make free use of talking to her imaginally, if it will get things off his head or heart.  Where needed imagine her own reassuring that she is OK and nothing owed to her now.  Refresh promises of faith for himself, as he did for her in life.  Griefshare program available if he regains interest. Support system -- Maintain personal support with [name] and any available friends.   Marital adjustment -- Work through [name], television/film/video how to  live together constructively, watch for times when it may feel like dealing with an ex and be willing to check perceptions under stress, hold off jumping to defensive conclusions.  Prioritize allying on practical solutions for issues and refusing to react like rivals. [name]'s care -- Continue to work with Medicaid, [name], and [name] as appropriate.  Stay open to positive developments, ask before  assuming the worse, and use calls and visits to foster more mature working relationship.  Look to future visits with him to practice coping with transitions and attachment to two homes. Family conflict -- Self-affirm good relationship with [name], continue retiring resentments with [name], focus on what he's building with [name] and welcoming the next generation.  S about allowing news to reach [name] and subdue worry. Other health care -- May need to reexamine sleep apnea and treatment or other interferences with sleep.  Follow through tending to teeth.  Maintain abstinence from alcohol and drugs.  Stretching and reasonable exercise for orthopedic issues and weight.  Maintain B12 or B complex supplementation. Other recommendations/advice -- As may be noted above.  Continue to utilize previously learned skills ad lib. Medication compliance -- Maintain medication as prescribed and work faithfully with relevant prescriber(s) if any changes are desired or seem indicated. Crisis service -- Aware of call list and work-in appts.  Call the clinic on-call service, 988/hotline, 911, or present to [facility] or ER if any life-threatening psychiatric crisis. Followup -- Return for time as already scheduled.  Next scheduled visit with me 07/27/2023.  Next scheduled in this office 07/27/2023.  Robley Fries, PhD [name], PhD LP Clinical Psychologist, [facility] [address] [place], [place] [address] [phone]

## 2023-07-25 NOTE — Progress Notes (Incomplete)
Psychotherapy Progress Note [facility] [name], PhD LP  Patient ID: ALEKSANDER EAST)    MRN: [id number] Therapy format: Individual psychotherapy Date: 04/13/2023      Start: 3:14p     Stop: 4:02p     Time Spent: 48 min Location: In-person   Session narrative (presenting needs, interim history, self-report of stressors and symptoms, applications of prior therapy, status changes, and interventions made in session) Underslept, cites [name]'s snoring, suspicion of sleep apnea.  Has had to deal with broken nights, some hours on the couch to get away from the noise.  Good news that [name]'s liver lesions have shrunk.  Heide Spark will be this Thursday, 3 days from now.  Just off a visit with [name], where he saw the new house first time, broke the news of marrying, and [name] was excited, to his relief.  Construction of the walk in tub finishes today.  No falls lately.  Still on the MRI list, moved up.  Re. relationship with [name], been hitting some conflict of late, fielding comments that sound bossy, and her bristling some.  Support/empathy provided.  Supportively challenged taking [name]'s moods personally, encouraged to recognize that she is simultaneously in frequent pain, out of her usual element, and getting ready to become more vulnerable again by marrying.  Therapeutic modalities: Cognitive Behavioral Therapy, Solution-Oriented/Positive Psychology, and Ego-Supportive  Mental Status/Observations:  Appearance:   Casual     Behavior:  Appropriate  Motor:  Affected by ortho pain  Speech/Language:   Clear and Coherent  Affect:  {PSY:22687}  Mood:  {PSY:31886}  Thought process:  {PSY:31888}  Thought content:    {PSY:[phone]}  Sensory/Perceptual disturbances:    {PSY:[phone]}  Orientation:  {Psych Orientation:23301::"Fully oriented"}  Attention:  {Good-Fair-Poor ratings:23770::"Good"}    Concentration:  {Good-Fair-Poor ratings:23770::"Good"}  Memory:   {PSY:[id number]}  Insight:    {Good-Fair-Poor ratings:23770::"Good"}  Judgment:   {Good-Fair-Poor ratings:23770::"Good"}  Impulse Control:  {Good-Fair-Poor ratings:23770::"Good"}   Risk Assessment: Danger to Self: {Risk:22599::"No"} Self-injurious Behavior: {Risk:22599::"No"} Danger to Others: {Risk:22599::"No"} Physical Aggression / Violence: {Risk:22599::"No"} Duty to Warn: {AMYesNo:22526::"No"} Access to Firearms a concern: {AMYesNo:22526::"No"}  Assessment of progress:  {Progress:22147::"progressing"}  Diagnosis:   ICD-10-CM   1. Bipolar affective disorder, currently depressed, mild (HCC)  F31.31     2. Generalized anxiety disorder  F41.1     3. Sleep apnea with hypersomnolence  G47.10    G47.30     4. Psychophysiological insomnia  F51.04     5. Suspected lumbar stenosis  [name]     6. Relationship problem between parent and child  Z62.820     7. Relationship problem with family member  Z63.8      Plan:  *** Other recommendations/advice -- As may be noted above.  Continue to utilize previously learned skills ad lib. Medication compliance -- Maintain medication as prescribed and work faithfully with relevant prescriber(s) if any changes are desired or seem indicated. Crisis service -- Aware of call list and work-in appts.  Call the clinic on-call service, 988/hotline, 911, or present to [facility] or ER if any life-threatening psychiatric crisis. Followup -- Return for time as already scheduled.  Next scheduled visit with me 04/27/2023.  Next scheduled in this office 04/27/2023.  Robley Fries, PhD [name], PhD LP Clinical Psychologist, [facility] [address] [place], [place] [address] [phone]

## 2023-07-27 ENCOUNTER — Ambulatory Visit: Payer: BC Managed Care – PPO | Admitting: Psychiatry

## 2023-07-30 DIAGNOSIS — M4316 Spondylolisthesis, lumbar region: Secondary | ICD-10-CM | POA: Diagnosis not present

## 2023-07-30 NOTE — Progress Notes (Signed)
Surgical Instructions    Your procedure is scheduled on Wednesday August 05, 2023.  Report to [facility] Main Entrance "A" at 5:30 A.M., then check in with the Admitting office.  Call this number if you have problems the morning of surgery:  [phone]   If you have any questions prior to your surgery date call [phone]: Open Monday-Friday 8am-4pm If you experience any cold or flu symptoms such as cough, fever, chills, shortness of breath, etc. between now and your scheduled surgery, please notify us at the above number     Remember:  Do not eat or drink after midnight the night before your surgery    Take these medicines the morning of surgery with A SIP OF WATER:  cloNIDine (CATAPRES)  lamoTRIgine (LAMICTAL)  Oxcarbazepine (TRILEPTAL)   If needed:  acetaminophen (TYLENOL)  LORazepam (ATIVAN)   As of today, STOP taking any Aspirin (unless otherwise instructed by your surgeon) Aleve, Naproxen, Ibuprofen, Motrin, Advil, Goody's, BC's, all herbal medications, fish oil, and all vitamins.  Special instructions:    Oral Hygiene is also important to reduce your risk of infection.  Remember - BRUSH YOUR TEETH THE MORNING OF SURGERY WITH YOUR REGULAR TOOTHPASTE    Pre-operative 5 CHG Bath Instructions   You can play a key role in reducing the risk of infection after surgery. Your skin needs to be as free of germs as possible. You can reduce the number of germs on your skin by washing with CHG (chlorhexidine gluconate) soap before surgery. CHG is an antiseptic soap that kills germs and continues to kill germs even after washing.   DO NOT use if you have an allergy to chlorhexidine/CHG or antibacterial soaps. If your skin becomes reddened or irritated, stop using the CHG and notify one of our RNs at [phone].   Please shower with the CHG soap starting 4 days before surgery using the following schedule:     Please keep in mind the following:  DO NOT shave, including legs  and underarms, starting the day of your first shower.   You may shave your face at any point before/day of surgery.  Place clean sheets on your bed the day you start using CHG soap. Use a clean washcloth (not used since being washed) for each shower. DO NOT sleep with pets once you start using the CHG.   CHG Shower Instructions:  If you choose to wash your hair and private area, wash first with your normal shampoo/soap.  After you use shampoo/soap, rinse your hair and body thoroughly to remove shampoo/soap residue.  Turn the water OFF and apply about 3 tablespoons (45 ml) of CHG soap to a CLEAN washcloth.  Apply CHG soap ONLY FROM YOUR NECK DOWN TO YOUR TOES (washing for 3-5 minutes)  DO NOT use CHG soap on face, private areas, open wounds, or sores.  Pay special attention to the area where your surgery is being performed.  If you are having back surgery, having someone wash your back for you may be helpful. Wait 2 minutes after CHG soap is applied, then you may rinse off the CHG soap.  Pat dry with a clean towel  Put on clean clothes/pajamas   If you choose to wear lotion, please use ONLY the CHG-compatible lotions on the back of this paper.     Additional instructions for the day of surgery: DO NOT APPLY any lotions, deodorants, cologne, or perfumes.   Put on clean/comfortable clothes.  Brush your teeth.  Ask your  nurse before applying any prescription medications to the skin.      CHG Compatible Lotions   Aveeno Moisturizing lotion  Cetaphil Moisturizing Cream  Cetaphil Moisturizing Lotion  Clairol Herbal Essence Moisturizing Lotion, Dry Skin  Clairol Herbal Essence Moisturizing Lotion, Extra Dry Skin  Clairol Herbal Essence Moisturizing Lotion, Normal Skin  Curel Age Defying Therapeutic Moisturizing Lotion with Alpha Hydroxy  Curel Extreme Care Body Lotion  Curel Soothing Hands Moisturizing Hand Lotion  Curel Therapeutic Moisturizing Cream, Fragrance-Free  Curel  Therapeutic Moisturizing Lotion, Fragrance-Free  Curel Therapeutic Moisturizing Lotion, Original Formula  Eucerin Daily Replenishing Lotion  Eucerin Dry Skin Therapy Plus Alpha Hydroxy Crme  Eucerin Dry Skin Therapy Plus Alpha Hydroxy Lotion  Eucerin Original Crme  Eucerin Original Lotion  Eucerin Plus Crme Eucerin Plus Lotion  Eucerin TriLipid Replenishing Lotion  Keri Anti-Bacterial Hand Lotion  Keri Deep Conditioning Original Lotion Dry Skin Formula Softly Scented  Keri Deep Conditioning Original Lotion, Fragrance Free Sensitive Skin Formula  Keri Lotion Fast Absorbing Fragrance Free Sensitive Skin Formula  Keri Lotion Fast Absorbing Softly Scented Dry Skin Formula  Keri Original Lotion  Keri Skin Renewal Lotion Keri Silky Smooth Lotion  Keri Silky Smooth Sensitive Skin Lotion  Nivea Body Creamy Conditioning Oil  Nivea Body Extra Enriched Lotion  Nivea Body Original Lotion  Nivea Body Sheer Moisturizing Lotion Nivea Crme  Nivea Skin Firming Lotion  NutraDerm 30 Skin Lotion  NutraDerm Skin Lotion  NutraDerm Therapeutic Skin Cream  NutraDerm Therapeutic Skin Lotion  ProShield Protective Hand Cream  Provon moisturizing lotion   [facility] is not responsible for any belongings or valuables.    Do NOT Smoke (Tobacco/Vaping)  24 hours prior to your procedure  If you use a CPAP at night, you may bring your mask for your overnight stay.   Contacts, glasses, hearing aids, dentures or partials may not be worn into surgery, please bring cases for these belongings   For patients admitted to the hospital, discharge time will be determined by your treatment team.   Patients discharged the day of surgery will not be allowed to drive home, and someone needs to stay with them for 24 hours.   SURGICAL WAITING ROOM VISITATION Patients having surgery or a procedure may have no more than 2 support people in the waiting area - these visitors may rotate.   Children under the age of 39  must have an adult with them who is not the patient. If the patient needs to stay at the hospital during part of their recovery, the visitor guidelines for inpatient rooms apply. Pre-op nurse will coordinate an appropriate time for 1 support person to accompany patient in pre-op.  This support person may not rotate.   Please refer to [email or web address] for the visitor guidelines for Inpatients (after your surgery is over and you are in a regular room).   If you received a COVID test during your pre-op visit, it is requested that you wear a mask when out in public, stay away from anyone that may not be feeling well, and notify your surgeon if you develop symptoms. If you have been in contact with anyone that has tested positive in the last 10 days, please notify your surgeon.    Please read over the following fact sheets that you were given.

## 2023-07-31 ENCOUNTER — Other Ambulatory Visit: Payer: Self-pay

## 2023-07-31 ENCOUNTER — Encounter (HOSPITAL_COMMUNITY): Payer: Self-pay

## 2023-07-31 ENCOUNTER — Encounter (HOSPITAL_COMMUNITY)
Admission: RE | Admit: 2023-07-31 | Discharge: 2023-07-31 | Disposition: A | Discharge status: Home / Self Care | Payer: BC Managed Care – PPO | Source: Ambulatory Visit | Attending: Neurosurgery | Admitting: Neurosurgery

## 2023-07-31 VITALS — BP 138/78 | HR 71 | Temp 98.0°F | Resp 19 | Ht 70.0 in | Wt 275.6 lb

## 2023-07-31 DIAGNOSIS — Z01812 Encounter for preprocedural laboratory examination: Secondary | ICD-10-CM | POA: Diagnosis not present

## 2023-07-31 DIAGNOSIS — Z01818 Encounter for other preprocedural examination: Secondary | ICD-10-CM

## 2023-07-31 LAB — CBC
HCT: 47.7 % (ref 39.0–52.0)
Hemoglobin: 16.2 g/dL (ref 13.0–17.0)
MCH: 29.8 pg (ref 26.0–34.0)
MCHC: 34 g/dL (ref 30.0–36.0)
MCV: 87.7 fL (ref 80.0–100.0)
Platelets: 230 10*3/uL (ref 150–400)
RBC: 5.44 MIL/uL (ref 4.22–5.81)
RDW: 12.9 % (ref 11.5–15.5)
WBC: 5.1 10*3/uL (ref 4.0–10.5)
nRBC: 0 % (ref 0.0–0.2)

## 2023-07-31 LAB — BASIC METABOLIC PANEL
Anion gap: 8 (ref 5–15)
BUN: 18 mg/dL (ref 8–23)
CO2: 30 mmol/L (ref 22–32)
Calcium: 9.2 mg/dL (ref 8.9–10.3)
Chloride: 95 mmol/L — ABNORMAL LOW (ref 98–111)
Creatinine, Ser: 1.11 mg/dL (ref 0.61–1.24)
GFR, Estimated: >60 mL/min (ref >60)
Glucose, Bld: 121 mg/dL — ABNORMAL HIGH (ref 70–99)
Potassium: 4.6 mmol/L (ref 3.5–5.1)
Sodium: 133 mmol/L — ABNORMAL LOW (ref 135–145)

## 2023-07-31 LAB — SURGICAL PCR SCREEN

## 2023-07-31 LAB — TYPE AND SCREEN
ABO/RH(D): A POS
Antibody Screen: NEGATIVE

## 2023-07-31 NOTE — Progress Notes (Signed)
PCP - [facility]  Cardiologist -   PPM/ICD - denies Device Orders - n/a Rep Notified - n/a  Chest x-ray - 12-09-22 EKG -  Stress Test -  ECHO -  Cardiac Cath -   Sleep Study -  5+ years ago "everything was " CPAP -   DM - denies  Blood Thinner Instructions: denies Aspirin Instructions:n/a  ERAS Protcol - NPO   COVID TEST- n/a   Anesthesia review: no  Patient denies shortness of breath, fever, cough and chest pain at PAT appointment   All instructions explained to the patient, with a verbal understanding of the material. Patient agrees to go over the instructions while at home for a better understanding. Patient also instructed to self quarantine after being tested for COVID-19. The opportunity to ask questions was provided.

## 2023-08-03 ENCOUNTER — Ambulatory Visit (INDEPENDENT_AMBULATORY_CARE_PROVIDER_SITE_OTHER): Payer: BC Managed Care – PPO | Admitting: Psychiatry

## 2023-08-03 ENCOUNTER — Encounter: Payer: Self-pay | Admitting: Psychiatry

## 2023-08-03 DIAGNOSIS — F3131 Bipolar disorder, current episode depressed, mild: Secondary | ICD-10-CM

## 2023-08-03 DIAGNOSIS — R7989 Other specified abnormal findings of blood chemistry: Secondary | ICD-10-CM

## 2023-08-03 DIAGNOSIS — M48061 Spinal stenosis, lumbar region without neurogenic claudication: Secondary | ICD-10-CM

## 2023-08-03 DIAGNOSIS — Z638 Other specified problems related to primary support group: Secondary | ICD-10-CM

## 2023-08-03 DIAGNOSIS — F411 Generalized anxiety disorder: Secondary | ICD-10-CM

## 2023-08-03 DIAGNOSIS — G4710 Hypersomnia, unspecified: Secondary | ICD-10-CM

## 2023-08-03 DIAGNOSIS — F5105 Insomnia due to other mental disorder: Secondary | ICD-10-CM

## 2023-08-03 DIAGNOSIS — G3184 Mild cognitive impairment, so stated: Secondary | ICD-10-CM

## 2023-08-03 DIAGNOSIS — F3162 Bipolar disorder, current episode mixed, moderate: Secondary | ICD-10-CM

## 2023-08-03 DIAGNOSIS — G4730 Sleep apnea, unspecified: Secondary | ICD-10-CM

## 2023-08-03 MED ORDER — OXCARBAZEPINE 300 MG PO TABS: ORAL_TABLET | ORAL | 1 refills | Status: DC

## 2023-08-03 MED ORDER — LAMOTRIGINE 100 MG PO TABS: ORAL_TABLET | ORAL | 1 refills | Status: DC

## 2023-08-03 MED ORDER — QUETIAPINE FUMARATE 400 MG PO TABS: 400.0000 mg | ORAL_TABLET | Freq: Every day | ORAL | 1 refills | Status: DC
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
## 2023-08-03 NOTE — Progress Notes (Signed)
Surgical PCR result invalid. Will need to be recollected DOS. Order placed.

## 2023-08-05 ENCOUNTER — Ambulatory Visit (HOSPITAL_COMMUNITY): Payer: Self-pay | Admitting: Anesthesiology

## 2023-08-05 ENCOUNTER — Other Ambulatory Visit: Payer: Self-pay

## 2023-08-05 ENCOUNTER — Encounter (HOSPITAL_COMMUNITY): Payer: Self-pay | Admitting: Neurosurgery

## 2023-08-05 ENCOUNTER — Encounter (HOSPITAL_COMMUNITY)
Admission: RE | Disposition: A | Discharge status: Home / Self Care | Payer: Self-pay | Source: Home / Self Care | Attending: Neurosurgery

## 2023-08-05 ENCOUNTER — Ambulatory Visit (HOSPITAL_COMMUNITY)
Admission: RE | Admit: 2023-08-05 | Discharge: 2023-08-06 | Disposition: A | Discharge status: Home / Self Care | Payer: BC Managed Care – PPO | Attending: Neurosurgery | Admitting: Neurosurgery

## 2023-08-05 ENCOUNTER — Ambulatory Visit (HOSPITAL_COMMUNITY): Payer: BC Managed Care – PPO

## 2023-08-05 DIAGNOSIS — F419 Anxiety disorder, unspecified: Secondary | ICD-10-CM | POA: Insufficient documentation

## 2023-08-05 DIAGNOSIS — M1990 Unspecified osteoarthritis, unspecified site: Secondary | ICD-10-CM | POA: Insufficient documentation

## 2023-08-05 DIAGNOSIS — M4316 Spondylolisthesis, lumbar region: Secondary | ICD-10-CM | POA: Diagnosis not present

## 2023-08-05 DIAGNOSIS — M5416 Radiculopathy, lumbar region: Secondary | ICD-10-CM | POA: Insufficient documentation

## 2023-08-05 DIAGNOSIS — F32A Depression, unspecified: Secondary | ICD-10-CM | POA: Diagnosis not present

## 2023-08-05 DIAGNOSIS — M48061 Spinal stenosis, lumbar region without neurogenic claudication: Secondary | ICD-10-CM | POA: Insufficient documentation

## 2023-08-05 DIAGNOSIS — Z01818 Encounter for other preprocedural examination: Secondary | ICD-10-CM

## 2023-08-05 DIAGNOSIS — Z981 Arthrodesis status: Secondary | ICD-10-CM | POA: Diagnosis not present

## 2023-08-05 HISTORY — PX: POSTERIOR FUSION PEDICLE SCREW PLACEMENT: SHX2186

## 2023-08-05 LAB — SURGICAL PCR SCREEN
MRSA, PCR: NEGATIVE
Staphylococcus aureus: NEGATIVE

## 2023-08-05 SURGERY — POSTERIOR LUMBAR FUSION 1 LEVEL
Anesthesia: General | Site: Back

## 2023-08-05 MED ORDER — LIDOCAINE-EPINEPHRINE 1 %-1:100000 IJ SOLN
INTRAMUSCULAR | Status: DC | PRN
  Administered 2023-08-05: 10 mL

## 2023-08-05 MED ORDER — CLONIDINE HCL 0.1 MG PO TABS
0.1000 mg | ORAL_TABLET | Freq: Every day | ORAL | Status: DC
  Administered 2023-08-06: 0.1 mg via ORAL
  Filled 2023-08-05: qty 1

## 2023-08-05 MED ORDER — LIDOCAINE-EPINEPHRINE 1 %-1:100000 IJ SOLN
INTRAMUSCULAR | Status: AC
  Filled 2023-08-05: qty 1

## 2023-08-05 MED ORDER — MIDAZOLAM HCL 5 MG/5ML IJ SOLN
INTRAMUSCULAR | Status: DC | PRN
  Administered 2023-08-05: 2 mg via INTRAVENOUS

## 2023-08-05 MED ORDER — CHLORHEXIDINE GLUCONATE CLOTH 2 % EX PADS: 6.0000 | MEDICATED_PAD | Freq: Once | CUTANEOUS | Status: DC

## 2023-08-05 MED ORDER — EPHEDRINE SULFATE-NACL 50-0.9 MG/10ML-% IV SOSY
PREFILLED_SYRINGE | INTRAVENOUS | Status: DC | PRN
  Administered 2023-08-05: 5 mg via INTRAVENOUS

## 2023-08-05 MED ORDER — BUPIVACAINE LIPOSOME 1.3 % IJ SUSP
INTRAMUSCULAR | Status: AC
  Filled 2023-08-05: qty 20

## 2023-08-05 MED ORDER — THROMBIN 20000 UNITS EX SOLR
CUTANEOUS | Status: DC | PRN
  Administered 2023-08-05: 20 mL via TOPICAL

## 2023-08-05 MED ORDER — ACETAMINOPHEN 325 MG PO TABS: 650.0000 mg | ORAL_TABLET | ORAL | Status: DC | PRN

## 2023-08-05 MED ORDER — FENTANYL CITRATE (PF) 100 MCG/2ML IJ SOLN
INTRAMUSCULAR | Status: AC
  Filled 2023-08-05: qty 2

## 2023-08-05 MED ORDER — OXCARBAZEPINE 300 MG PO TABS
600.0000 mg | ORAL_TABLET | Freq: Two times a day (BID) | ORAL | Status: DC
  Administered 2023-08-05 – 2023-08-06 (×2): 600 mg via ORAL
  Filled 2023-08-05 (×3): qty 2

## 2023-08-05 MED ORDER — HYDROMORPHONE HCL 1 MG/ML IJ SOLN
0.5000 mg | INTRAMUSCULAR | Status: DC | PRN
  Administered 2023-08-05: 0.5 mg via INTRAVENOUS
  Filled 2023-08-05: qty 0.5

## 2023-08-05 MED ORDER — FENTANYL CITRATE (PF) 100 MCG/2ML IJ SOLN: 25.0000 ug | INTRAMUSCULAR | Status: DC | PRN

## 2023-08-05 MED ORDER — ACETAMINOPHEN 10 MG/ML IV SOLN
INTRAVENOUS | Status: AC
  Filled 2023-08-05: qty 100

## 2023-08-05 MED ORDER — ACETAMINOPHEN 500 MG PO TABS: 1000.0000 mg | ORAL_TABLET | Freq: Once | ORAL | Status: DC | PRN

## 2023-08-05 MED ORDER — SODIUM CHLORIDE 0.9 % IV SOLN
250.0000 mL | INTRAVENOUS | Status: AC
  Administered 2023-08-05: 250 mL via INTRAVENOUS

## 2023-08-05 MED ORDER — OXYCODONE HCL 5 MG/5ML PO SOLN
ORAL | Status: AC
  Filled 2023-08-05: qty 5

## 2023-08-05 MED ORDER — LIDOCAINE 2% (20 MG/ML) 5 ML SYRINGE
INTRAMUSCULAR | Status: DC | PRN
  Administered 2023-08-05: 80 mg via INTRAVENOUS

## 2023-08-05 MED ORDER — SODIUM CHLORIDE 0.9% FLUSH: 3.0000 mL | INTRAVENOUS | Status: DC | PRN

## 2023-08-05 MED ORDER — CHLORHEXIDINE GLUCONATE 0.12 % MT SOLN
15.0000 mL | Freq: Once | OROMUCOSAL | Status: AC
  Administered 2023-08-05: 15 mL via OROMUCOSAL
  Filled 2023-08-05: qty 15

## 2023-08-05 MED ORDER — THROMBIN 20000 UNITS EX SOLR
CUTANEOUS | Status: AC
  Filled 2023-08-05: qty 20000

## 2023-08-05 MED ORDER — PROPOFOL 10 MG/ML IV BOLUS
INTRAVENOUS | Status: DC | PRN
  Administered 2023-08-05: 20 mg via INTRAVENOUS
  Administered 2023-08-05: 30 mg via INTRAVENOUS
  Administered 2023-08-05: 130 mg via INTRAVENOUS

## 2023-08-05 MED ORDER — OXYCODONE HCL 5 MG/5ML PO SOLN: 5.0000 mg | Freq: Once | ORAL | Status: DC | PRN

## 2023-08-05 MED ORDER — CYCLOBENZAPRINE HCL 10 MG PO TABS
10.0000 mg | ORAL_TABLET | Freq: Three times a day (TID) | ORAL | Status: DC | PRN
Start: 2023-08-05 — End: 2023-08-06
  Administered 2023-08-05: 10 mg via ORAL
  Filled 2023-08-05: qty 1

## 2023-08-05 MED ORDER — ORAL CARE MOUTH RINSE: 15.0000 mL | Freq: Once | OROMUCOSAL | Status: AC

## 2023-08-05 MED ORDER — ACETAMINOPHEN 10 MG/ML IV SOLN
1000.0000 mg | Freq: Once | INTRAVENOUS | Status: DC | PRN
  Administered 2023-08-05: 1000 mg via INTRAVENOUS

## 2023-08-05 MED ORDER — CEFAZOLIN SODIUM-DEXTROSE 2-4 GM/100ML-% IV SOLN
2.0000 g | Freq: Three times a day (TID) | INTRAVENOUS | Status: DC
  Administered 2023-08-05 – 2023-08-06 (×3): 2 g via INTRAVENOUS
  Filled 2023-08-05 (×3): qty 100

## 2023-08-05 MED ORDER — 0.9 % SODIUM CHLORIDE (POUR BTL) OPTIME
TOPICAL | Status: DC | PRN
  Administered 2023-08-05 (×2): 1000 mL

## 2023-08-05 MED ORDER — ACETAMINOPHEN 650 MG RE SUPP: 650.0000 mg | RECTAL | Status: DC | PRN

## 2023-08-05 MED ORDER — ACETAMINOPHEN 10 MG/ML IV SOLN: 1000.0000 mg | Freq: Once | INTRAVENOUS | Status: DC | PRN

## 2023-08-05 MED ORDER — ACETAMINOPHEN 500 MG PO TABS: 1000.0000 mg | ORAL_TABLET | Freq: Four times a day (QID) | ORAL | Status: DC | PRN

## 2023-08-05 MED ORDER — ONDANSETRON HCL 4 MG/2ML IJ SOLN: 4.0000 mg | Freq: Four times a day (QID) | INTRAMUSCULAR | Status: DC | PRN

## 2023-08-05 MED ORDER — FENTANYL CITRATE (PF) 250 MCG/5ML IJ SOLN
INTRAMUSCULAR | Status: AC
  Filled 2023-08-05: qty 5

## 2023-08-05 MED ORDER — DEXAMETHASONE SODIUM PHOSPHATE 10 MG/ML IJ SOLN
INTRAMUSCULAR | Status: DC | PRN
  Administered 2023-08-05: 10 mg via INTRAVENOUS

## 2023-08-05 MED ORDER — PANTOPRAZOLE SODIUM 40 MG IV SOLR: 40.0000 mg | Freq: Every day | INTRAVENOUS | Status: DC

## 2023-08-05 MED ORDER — ONDANSETRON HCL 4 MG/2ML IJ SOLN
INTRAMUSCULAR | Status: DC | PRN
  Administered 2023-08-05: 4 mg via INTRAVENOUS

## 2023-08-05 MED ORDER — PHENOL 1.4 % MT LIQD: 1.0000 | OROMUCOSAL | Status: DC | PRN

## 2023-08-05 MED ORDER — SODIUM CHLORIDE 0.9 % IV SOLN: INTRAVENOUS | Status: DC | PRN

## 2023-08-05 MED ORDER — FENTANYL CITRATE (PF) 100 MCG/2ML IJ SOLN
25.0000 ug | INTRAMUSCULAR | Status: DC | PRN
  Administered 2023-08-05 (×2): 50 ug via INTRAVENOUS
  Administered 2023-08-05 (×2): 25 ug via INTRAVENOUS

## 2023-08-05 MED ORDER — ACETAMINOPHEN 160 MG/5ML PO SOLN: 1000.0000 mg | Freq: Once | ORAL | Status: DC | PRN

## 2023-08-05 MED ORDER — PROPOFOL 10 MG/ML IV BOLUS
INTRAVENOUS | Status: AC
  Filled 2023-08-05: qty 20

## 2023-08-05 MED ORDER — LACTATED RINGERS IV SOLN: INTRAVENOUS | Status: DC | PRN

## 2023-08-05 MED ORDER — MIDAZOLAM HCL 2 MG/2ML IJ SOLN
INTRAMUSCULAR | Status: AC
  Filled 2023-08-05: qty 2

## 2023-08-05 MED ORDER — LAMOTRIGINE 100 MG PO TABS
100.0000 mg | ORAL_TABLET | ORAL | Status: DC
  Administered 2023-08-05 – 2023-08-06 (×2): 100 mg via ORAL
  Filled 2023-08-05 (×3): qty 1

## 2023-08-05 MED ORDER — CEFAZOLIN SODIUM-DEXTROSE 3-4 GM/150ML-% IV SOLN
3.0000 g | INTRAVENOUS | Status: AC
  Administered 2023-08-05: 3 g via INTRAVENOUS
  Filled 2023-08-05: qty 150

## 2023-08-05 MED ORDER — FENTANYL CITRATE (PF) 250 MCG/5ML IJ SOLN
INTRAMUSCULAR | Status: DC | PRN
  Administered 2023-08-05: 25 ug via INTRAVENOUS
  Administered 2023-08-05: 100 ug via INTRAVENOUS
  Administered 2023-08-05 (×3): 25 ug via INTRAVENOUS
  Administered 2023-08-05: 50 ug via INTRAVENOUS

## 2023-08-05 MED ORDER — ONDANSETRON HCL 4 MG PO TABS
4.0000 mg | ORAL_TABLET | Freq: Four times a day (QID) | ORAL | Status: DC | PRN
  Filled 2023-08-05: qty 1

## 2023-08-05 MED ORDER — ALBUMIN HUMAN 5 % IV SOLN: INTRAVENOUS | Status: DC | PRN

## 2023-08-05 MED ORDER — OXYCODONE HCL 5 MG/5ML PO SOLN
5.0000 mg | Freq: Once | ORAL | Status: AC | PRN
  Administered 2023-08-05: 5 mg via ORAL

## 2023-08-05 MED ORDER — SODIUM CHLORIDE 0.9% FLUSH
3.0000 mL | Freq: Two times a day (BID) | INTRAVENOUS | Status: DC
  Administered 2023-08-05: 3 mL via INTRAVENOUS

## 2023-08-05 MED ORDER — OXYCODONE HCL 5 MG PO TABS: 5.0000 mg | ORAL_TABLET | Freq: Once | ORAL | Status: DC | PRN

## 2023-08-05 MED ORDER — PHENYLEPHRINE HCL (PRESSORS) 10 MG/ML IV SOLN
INTRAVENOUS | Status: DC | PRN
  Administered 2023-08-05: 80 ug via INTRAVENOUS
  Administered 2023-08-05: 160 ug via INTRAVENOUS
  Administered 2023-08-05: 80 ug via INTRAVENOUS

## 2023-08-05 MED ORDER — BUPIVACAINE LIPOSOME 1.3 % IJ SUSP
INTRAMUSCULAR | Status: DC | PRN
  Administered 2023-08-05: 20 mL

## 2023-08-05 MED ORDER — ROCURONIUM BROMIDE 100 MG/10ML IV SOLN
INTRAVENOUS | Status: DC | PRN
  Administered 2023-08-05: 80 mg via INTRAVENOUS
  Administered 2023-08-05 (×2): 10 mg via INTRAVENOUS
  Administered 2023-08-05: 20 mg via INTRAVENOUS

## 2023-08-05 MED ORDER — QUETIAPINE FUMARATE 200 MG PO TABS
400.0000 mg | ORAL_TABLET | Freq: Every day | ORAL | Status: DC
  Administered 2023-08-05: 400 mg via ORAL
  Filled 2023-08-05 (×2): qty 2

## 2023-08-05 MED ORDER — PANTOPRAZOLE SODIUM 40 MG PO TBEC
40.0000 mg | DELAYED_RELEASE_TABLET | Freq: Every day | ORAL | Status: DC
  Administered 2023-08-05 – 2023-08-06 (×2): 40 mg via ORAL
  Filled 2023-08-05 (×2): qty 1

## 2023-08-05 MED ORDER — SUGAMMADEX SODIUM 200 MG/2ML IV SOLN
INTRAVENOUS | Status: DC | PRN
  Administered 2023-08-05: 200 mg via INTRAVENOUS

## 2023-08-05 MED ORDER — MENTHOL 3 MG MT LOZG: 1.0000 | LOZENGE | OROMUCOSAL | Status: DC | PRN

## 2023-08-05 MED ORDER — LORAZEPAM 0.5 MG PO TABS: 1.0000 mg | ORAL_TABLET | Freq: Three times a day (TID) | ORAL | Status: DC | PRN

## 2023-08-05 MED ORDER — CLONIDINE HCL 0.2 MG PO TABS
0.2000 mg | ORAL_TABLET | Freq: Every day | ORAL | Status: DC
  Administered 2023-08-05: 0.2 mg via ORAL
  Filled 2023-08-05 (×2): qty 1

## 2023-08-05 MED ORDER — LAMOTRIGINE 100 MG PO TABS
200.0000 mg | ORAL_TABLET | Freq: Every day | ORAL | Status: DC
  Administered 2023-08-05: 200 mg via ORAL
  Filled 2023-08-05: qty 2

## 2023-08-05 MED ORDER — HYDROCODONE-ACETAMINOPHEN 5-325 MG PO TABS
2.0000 | ORAL_TABLET | ORAL | Status: DC | PRN
  Administered 2023-08-05 – 2023-08-06 (×4): 2 via ORAL
  Filled 2023-08-05 (×4): qty 2

## 2023-08-05 MED ORDER — CLONIDINE HCL 0.1 MG PO TABS: 0.1000 mg | ORAL_TABLET | ORAL | Status: DC

## 2023-08-05 MED ORDER — OXYCODONE HCL 5 MG PO TABS: 5.0000 mg | ORAL_TABLET | Freq: Once | ORAL | Status: AC | PRN

## 2023-08-05 MED ORDER — ALUM & MAG HYDROXIDE-SIMETH 200-200-20 MG/5ML PO SUSP
30.0000 mL | Freq: Four times a day (QID) | ORAL | Status: DC | PRN
Start: 2023-08-05 — End: 2023-08-06
  Administered 2023-08-05: 30 mL via ORAL
  Filled 2023-08-05: qty 30

## 2023-08-05 MED ORDER — LAMOTRIGINE 100 MG PO TABS: 100.0000 mg | ORAL_TABLET | Freq: Three times a day (TID) | ORAL | Status: DC

## 2023-08-05 SURGICAL SUPPLY — 67 items
BAG COUNTER SPONGE SURGICOUNT (BAG) ×1 IMPLANT
BASKET BONE COLLECTION (BASKET) ×1 IMPLANT
BENZOIN TINCTURE PRP APPL 2/3 (GAUZE/BANDAGES/DRESSINGS) ×1 IMPLANT
BIT DRILL MAS PLIF 4.5 DISP (DRILL) IMPLANT
BLADE BONE MILL MEDIUM (MISCELLANEOUS) ×1 IMPLANT
BLADE CLIPPER SURG (BLADE) IMPLANT
BLADE SURG 11 STRL SS (BLADE) ×1 IMPLANT
BONE VIVIGEN FORMABLE 5.4CC (Bone Implant) ×1 IMPLANT
BUR CUTTER 7.0 ROUND (BURR) ×1 IMPLANT
BUR MATCHSTICK NEURO 3.0 LAGG (BURR) ×1 IMPLANT
CAGE COROENT 8X9X23 MP LRG (Spacer) IMPLANT
CANISTER SUCT 3000ML PPV (MISCELLANEOUS) ×1 IMPLANT
CAP RELINE MOD TULIP RMM (Cap) IMPLANT
CNTNR URN SCR LID CUP LEK RST (MISCELLANEOUS) ×1 IMPLANT
COVER BACK TABLE 60X90IN (DRAPES) ×1 IMPLANT
DERMABOND ADVANCED .7 DNX12 (GAUZE/BANDAGES/DRESSINGS) ×1 IMPLANT
DRAPE C-ARM 42X72 X-RAY (DRAPES) ×2 IMPLANT
DRAPE C-ARMOR (DRAPES) IMPLANT
DRAPE HALF SHEET 40X57 (DRAPES) IMPLANT
DRAPE LAPAROTOMY 100X72X124 (DRAPES) ×1 IMPLANT
DRAPE SURG 17X23 STRL (DRAPES) ×1 IMPLANT
DRSG OPSITE 4X5.5 SM (GAUZE/BANDAGES/DRESSINGS) ×1 IMPLANT
DRSG OPSITE POSTOP 4X6 (GAUZE/BANDAGES/DRESSINGS) ×1 IMPLANT
DRSG OPSITE POSTOP 4X8 (GAUZE/BANDAGES/DRESSINGS) IMPLANT
DURAPREP 26ML APPLICATOR (WOUND CARE) ×1 IMPLANT
ELECT REM PT RETURN 9FT ADLT (ELECTROSURGICAL) ×1 IMPLANT
ELECTRODE REM PT RTRN 9FT ADLT (ELECTROSURGICAL) ×1 IMPLANT
EVACUATOR 1/8 PVC DRAIN (DRAIN) IMPLANT
EVACUATOR 3/16 PVC DRAIN (DRAIN) IMPLANT
GAUZE 4X4 16PLY ~~LOC~~+RFID DBL (SPONGE) IMPLANT
GAUZE SPONGE 4X4 12PLY STRL (GAUZE/BANDAGES/DRESSINGS) ×1 IMPLANT
GLOVE BIO SURGEON STRL SZ7 (GLOVE) IMPLANT
GLOVE BIO SURGEON STRL SZ8 (GLOVE) ×2 IMPLANT
GLOVE BIOGEL PI IND STRL 7.0 (GLOVE) IMPLANT
GLOVE EXAM NITRILE XL STR (GLOVE) IMPLANT
GLOVE INDICATOR 8.5 STRL (GLOVE) ×2 IMPLANT
GOWN STRL REUS W/ TWL LRG LVL3 (GOWN DISPOSABLE) IMPLANT
GOWN STRL REUS W/ TWL XL LVL3 (GOWN DISPOSABLE) ×2 IMPLANT
GOWN STRL REUS W/TWL 2XL LVL3 (GOWN DISPOSABLE) IMPLANT
GRAFT BNE MATRIX VG FRMBL MD 5 (Bone Implant) IMPLANT
KIT BASIN OR (CUSTOM PROCEDURE TRAY) ×1 IMPLANT
KIT TURNOVER KIT B (KITS) ×1 IMPLANT
MILL BONE PREP (MISCELLANEOUS) ×1 IMPLANT
NDL HYPO 21X1.5 SAFETY (NEEDLE) IMPLANT
NDL HYPO 25X1 1.5 SAFETY (NEEDLE) ×1 IMPLANT
NEEDLE HYPO 21X1.5 SAFETY (NEEDLE) ×1 IMPLANT
NEEDLE HYPO 25X1 1.5 SAFETY (NEEDLE) ×1 IMPLANT
NS IRRIG 1000ML POUR BTL (IV SOLUTION) ×1 IMPLANT
PACK LAMINECTOMY NEURO (CUSTOM PROCEDURE TRAY) ×1 IMPLANT
PAD ARMBOARD 7.5X6 YLW CONV (MISCELLANEOUS) ×3 IMPLANT
ROD RELINE COCR LORD 5X40MM (Rod) IMPLANT
SCREW LOCK RSS 4.5/5.0MM (Screw) IMPLANT
SCREW SHANK RELINE MOD 5.5X30 (Screw) IMPLANT
SCREW SHANK RELINE MOD 5.5X35 (Screw) IMPLANT
SCREW SHANK RELINE MOD 6.5X30 (Screw) IMPLANT
SPIKE FLUID TRANSFER (MISCELLANEOUS) ×1 IMPLANT
SPONGE SURGIFOAM ABS GEL 100 (HEMOSTASIS) ×1 IMPLANT
SPONGE T-LAP 4X18 ~~LOC~~+RFID (SPONGE) IMPLANT
STRIP CLOSURE SKIN 1/2X4 (GAUZE/BANDAGES/DRESSINGS) ×2 IMPLANT
SUT VIC AB 0 CT1 18XCR BRD8 (SUTURE) ×2 IMPLANT
SUT VIC AB 2-0 CT1 18 (SUTURE) ×1 IMPLANT
SUT VIC AB 4-0 PS2 27 (SUTURE) ×1 IMPLANT
SYR 20ML LL LF (SYRINGE) IMPLANT
TOWEL GREEN STERILE (TOWEL DISPOSABLE) ×1 IMPLANT
TOWEL GREEN STERILE FF (TOWEL DISPOSABLE) ×1 IMPLANT
TRAY FOLEY MTR SLVR 16FR STAT (SET/KITS/TRAYS/PACK) ×1 IMPLANT
WATER STERILE IRR 1000ML POUR (IV SOLUTION) ×1 IMPLANT

## 2023-08-05 NOTE — Anesthesia Postprocedure Evaluation (Signed)
Anesthesia Post Note  Patient: Johnny [name]  Procedure(s) Performed: Posterior Lumbar Interbody Fusion - Lumbar Four-Lumbar Five - bilateral - Interbody Fusion (Back)     Patient location during evaluation: PACU Anesthesia Type: General Level of consciousness: awake and alert Pain management: pain level controlled Vital Signs Assessment: post-procedure vital signs reviewed and stable Respiratory status: spontaneous breathing, nonlabored ventilation and respiratory function stable Cardiovascular status: blood pressure returned to baseline and stable Postop Assessment: no apparent nausea or vomiting Anesthetic complications: no   No notable events documented.                Johnluke Haugen

## 2023-08-05 NOTE — Plan of Care (Signed)
  Problem: Education: Goal: Knowledge of General Education information will improve Description: Including pain rating scale, medication(s)/side effects and non-pharmacologic comfort measures Outcome: Progressing   Problem: Health Behavior/Discharge Planning: Goal: Ability to manage health-related needs will improve Outcome: Progressing   Problem: Clinical Measurements: Goal: Ability to maintain clinical measurements within normal limits will improve Outcome: Progressing Goal: Will remain free from infection Outcome: Progressing Goal: Diagnostic test results will improve Outcome: Progressing Goal: Respiratory complications will improve Outcome: Progressing Goal: Cardiovascular complication will be avoided Outcome: Progressing   Problem: Nutrition: Goal: Adequate nutrition will be maintained Outcome: Progressing   Problem: Pain Managment: Goal: General experience of comfort will improve and/or be controlled Outcome: Progressing   Problem: Skin Integrity: Goal: Risk for impaired skin integrity will decrease Outcome: Progressing

## 2023-08-05 NOTE — Anesthesia Preprocedure Evaluation (Signed)
Anesthesia Evaluation  Patient identified by MRN, date of birth, ID band Patient awake    Reviewed: Allergy & Precautions, NPO status , Patient's Chart, lab work & pertinent test results  History of Anesthesia Complications (+) PONV and history of anesthetic complications  Airway Mallampati: II  TM Distance: >3 FB Neck ROM: Full    Dental  (+) Dental Advisory Given, Teeth Intact,    Pulmonary neg shortness of breath, neg sleep apnea, neg COPD, neg recent URI   breath sounds clear to auscultation       Cardiovascular negative cardio ROS  Rhythm:Regular     Neuro/Psych  Headaches PSYCHIATRIC DISORDERS Anxiety Depression     Neuromuscular disease    GI/Hepatic negative GI ROS, Neg liver ROS,,,  Endo/Other    Renal/GU negative Renal ROS     Musculoskeletal  (+) Arthritis ,    Abdominal   Peds  Hematology Lab Results      Component                Value               Date                      WBC                      5.1                 07/31/2023                HGB                      16.2                07/31/2023                HCT                      47.7                07/31/2023                MCV                      87.7                07/31/2023                PLT                      230                 07/31/2023              Anesthesia Other Findings   Reproductive/Obstetrics                             Anesthesia Physical Anesthesia Plan  ASA: 2  Anesthesia Plan: General   Post-op Pain Management: Ofirmev IV (intra-op)*   Induction: Intravenous  PONV Risk Score and Plan: 4 or greater and Ondansetron and Dexamethasone  Airway Management Planned: Oral ETT  Additional Equipment: None  Intra-op Plan:   Post-operative Plan: Extubation in OR  Informed Consent: I have reviewed the patients History and Physical, chart, labs and discussed the procedure including the risks,  benefits and alternatives  for the proposed anesthesia with the patient or authorized representative who has indicated his/her understanding and acceptance.     Dental advisory given  Plan Discussed with: CRNA  Anesthesia Plan Comments:        Anesthesia Quick Evaluation

## 2023-08-05 NOTE — Op Note (Signed)
Preoperative diagnosis: Grade 1 spondylolisthesis lumbar spinal stenosis L4-5 with bilateral L4-L5 radiculopathies.  Postoperative diagnosis: Same.  Procedure: #1 decompressive lumbar laminectomy with complete facetectomies radical foraminotomies and Gill decompression of the L4 nerve roots in excess and requiring more work than would be needed with a standard interbody fusion.  2.  Posterior lumbar to body fusion utilizing the NuVasive peek insert and rotate cages packed with locally harvested autograft mixed with Vivigen.  3.  Cortical screw fixation L4-5 utilizing the NuVasive modular cortical screw set.  4.  Posterolateral arthrodesis L4-5 utilizing locally harvested autograft mixed with Vivigen.  Surgeon: Donalee Citrin.  Assistant: Julien Girt.  Anesthesia: General.  EBL: 71  HPI: 63 year old gentleman progressive worsening back and bilateral hip and leg pain rating down L4-L5 nerve root pattern workup revealed severe spinal stenosis grade 1 spondylolisthesis with motion on flexion-extension films at L4-5.  Due to the patient's progression of clinical syndrome imaging findings of a conservative treatment I recommended decompressive laminectomy and interbody fusion at that level.  I extensively reviewed the risks and benefits of the operation with patient as well as perioperative course expectations of outcome and alternatives to surgery and he understood and agreed to proceed forward.  Operative procedure: Patient was brought into the OR was induced under general anesthesia positioned prone the Wilson frame his back was prepped and draped in routine sterile fashion.  Preoperative x-ray localized the appropriate level so after infiltration of 10 cc lidocaine with epi midline incision was made and Bovie electrocautery was used to take down the subcutaneous tissue and subperiosteal dissection was carried lamina of L4 and L5 bilaterally.  Interoperative x-ray confirmed indication appropriate  level there was marked facet arthropathy a large external synovial cyst coming off the facet joints and significant dystrophic calcifications.  Spinous process at L4 was removed central decompression was begun I then performed complete medial facetectomies freeing up the scar tissue and removing large medially projecting spurs that were causing severe compression of thecal sac.  I then drilled down the inferomedial aspect of both pedicles at L4 and performed radical foraminotomies bone through the pars to gain access and completely decompress the L4 nerve roots.  I then aggressively under bit the superior tickling facet to gain access lateral margin of the space and perform radical foraminotomies of the L5 nerve roots.  This space was then incised and entered and cleaned out bilaterally I then utilized and sized up the 8 mm tall 23 mm length 15 degree peek cages after packing them with the locally harvested autograft mixed with Vivigen I inserted them under fluoroscopy with an extensive mount of autograft material between the cages.  Postop imaging confirmed good position of the interbody implants I then placed 4 cortical screws under fluoroscopy all screws with excellent purchase then aggressively decorticated the facets and lateral masses and TPs and packed the remainder the locally harvested autograft posterior laterally on the lateral aspect of the facet from TP to TP I then assembled the tulips advanced the screws placed rods and all this went without event except on the left L4 screw the threads on the tulip head would not except the knots upon final tightening and 3 different knots were tried knees all stripped so ended up having replaced that screw I want 1 size larger in diameter and then postop imaging again confirmed good position of the implants assembling new thread and then all knots were able to be anchored down and L4 was compressed against L5.  I  then laid Gelfoam atop the dura after confirming  patency of all the neuroforamina late placed a medium Hemovac drain injected Exparel in the fascia and closed the wound in layers with interrupted Vicryl and a running 4 subcuticular.  Dermabond benzoin Steri-Strips and sterile dressing was applied patient recovery in stable condition.  At the end the case all needle count sponge counts were correct.

## 2023-08-05 NOTE — Anesthesia Procedure Notes (Signed)
Procedure Name: Intubation Date/Time: 08/05/2023 8:42 AM  Performed by: Halina Andreas, CRNAPre-anesthesia Checklist: Patient identified, Emergency Drugs available, Suction available, Patient being monitored and Timeout performed Patient Re-evaluated:Patient Re-evaluated prior to induction Oxygen Delivery Method: Circle system utilized Preoxygenation: Pre-oxygenation with 100% oxygen Induction Type: IV induction Ventilation: Mask ventilation without difficulty Laryngoscope Size: McGrath and 3 Grade View: Grade I Tube type: Oral Tube size: 7.5 mm Number of attempts: 1 Airway Equipment and Method: Stylet Placement Confirmation: ETT inserted through vocal cords under direct vision, positive ETCO2 and breath sounds checked- equal and bilateral Secured at: 22 cm Tube secured with: Tape Dental Injury: Teeth and Oropharynx as per pre-operative assessment
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
## 2023-08-05 NOTE — Transfer of Care (Signed)
Immediate Anesthesia Transfer of Care Note  Patient: Johnny [name]  Procedure(s) Performed: Posterior Lumbar Interbody Fusion - Lumbar Four-Lumbar Five - bilateral - Interbody Fusion (Back)  Patient Location: PACU  Anesthesia Type:General  Level of Consciousness: awake  Airway & Oxygen Therapy: Patient Spontanous Breathing  Post-op Assessment: Report given to RN  Post vital signs: stable  Last Vitals:  Vitals Value Taken Time  BP 140/82 08/05/23 1113  Temp    Pulse 89 08/05/23 1114  Resp 17 08/05/23 1114  SpO2 93 % 08/05/23 1114  Vitals shown include unfiled device data.  Last Pain:  Vitals:   08/05/23 0623  TempSrc:   PainSc: 1       Patients Stated Pain Goal: 2 (08/05/23 9562)  Complications: No notable events documented.

## 2023-08-06 ENCOUNTER — Encounter (HOSPITAL_COMMUNITY): Payer: Self-pay | Admitting: Neurosurgery

## 2023-08-06 DIAGNOSIS — M48061 Spinal stenosis, lumbar region without neurogenic claudication: Secondary | ICD-10-CM | POA: Diagnosis not present

## 2023-08-06 DIAGNOSIS — M4316 Spondylolisthesis, lumbar region: Secondary | ICD-10-CM | POA: Diagnosis not present

## 2023-08-06 DIAGNOSIS — F32A Depression, unspecified: Secondary | ICD-10-CM | POA: Diagnosis not present

## 2023-08-06 DIAGNOSIS — F419 Anxiety disorder, unspecified: Secondary | ICD-10-CM | POA: Diagnosis not present

## 2023-08-06 DIAGNOSIS — M1990 Unspecified osteoarthritis, unspecified site: Secondary | ICD-10-CM | POA: Diagnosis not present

## 2023-08-06 DIAGNOSIS — M5416 Radiculopathy, lumbar region: Secondary | ICD-10-CM | POA: Diagnosis not present

## 2023-08-06 MED ORDER — HYDROCODONE-ACETAMINOPHEN 5-325 MG PO TABS: 1.0000 | ORAL_TABLET | ORAL | 0 refills | Status: AC | PRN

## 2023-08-06 MED ORDER — CYCLOBENZAPRINE HCL 10 MG PO TABS: 10.0000 mg | ORAL_TABLET | Freq: Three times a day (TID) | ORAL | 0 refills | Status: AC | PRN
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
## 2023-08-06 NOTE — Discharge Summary (Signed)
Physician Discharge Summary  Patient ID: Johnny [name] MRN: [id number] DOB/AGE: 02-12-1961 63 y.o.  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     Discharge Diagnoses: same   Discharged Condition: good  Hospital Course: The patient was admitted on 08/05/2023 and taken to the operating room where the patient underwent plif L4-5. The patient tolerated the procedure well and was taken to the recovery room and then to the floor in stable condition. The hospital course was routine. There were no complications. The wound remained clean dry and intact. Pt had appropriate back soreness. No complaints of leg pain or new N/T/W. The patient remained afebrile with stable vital signs, and tolerated a regular diet. The patient continued to increase activities, and pain was well controlled with oral pain medications.   Consults: None  Significant Diagnostic Studies:  Results for orders placed or performed during the hospital encounter of 08/05/23  Surgical pcr screen   Collection Time: 08/05/23  5:53 AM   Specimen: Nasal Mucosa; Nasal Swab  Result Value Ref Range   MRSA, PCR NEGATIVE NEGATIVE   Staphylococcus aureus NEGATIVE NEGATIVE    DG Lumbar Spine 1 View Result Date: 08/05/2023 CLINICAL DATA:  Elective surgery EXAM: LUMBAR SPINE - 1 VIEW COMPARISON:  None Available. FINDINGS: Portable cross-table lateral view of the lumbar spine obtained in the operating room. Posterior rod with intrapedicular screw fusion L4-L5 with interbody spacer. Mild overlying artifact. IMPRESSION: L4-L5 posterior fusion with interbody spacer. Electronically Signed   By: Narda Rutherford M.D.   On: 08/05/2023 12:42   DG Lumbar Spine 2-3 Views Result Date: 08/05/2023 CLINICAL DATA:  Elective surgery. EXAM: LUMBAR SPINE - 2-3 VIEW COMPARISON:  Preoperative imaging. FINDINGS: Two fluoroscopic spot views of  the lumbar spine obtained in the operating room. Pedicle screws at L4 and L5 with interbody spacer. Fluoroscopy time 1 minutes 15 seconds. Dose 128.04 mGy. IMPRESSION: Procedural fluoroscopy during lumbar surgery. Electronically Signed   By: Narda Rutherford M.D.   On: 08/05/2023 10:40   DG C-Arm 1-60 Min-No Report Result Date: 08/05/2023 Fluoroscopy was utilized by the requesting physician.  No radiographic interpretation.   DG C-Arm 1-60 Min-No Report Result Date: 08/05/2023 Fluoroscopy was utilized by the requesting physician.  No radiographic interpretation.   DG C-Arm 1-60 Min-No Report Result Date: 08/05/2023 Fluoroscopy was utilized by the requesting physician.  No radiographic interpretation.    Antibiotics:  Anti-infectives (From admission, onward)    Start     Dose/Rate Route Frequency Ordered Stop   08/05/23 1530  ceFAZolin (ANCEF) IVPB 2g/100 mL premix        2 g 200 mL/hr over 30 Minutes Intravenous Every 8 hours 08/05/23 1229 08/07/23 1529   08/05/23 0600  ceFAZolin (ANCEF) IVPB 3g/150 mL premix        3 g 300 mL/hr over 30 Minutes Intravenous On call to O.R. 08/05/23 1027 08/05/23 0752       Discharge Exam: Blood pressure 138/76, pulse 82, temperature 98 F (36.7 C), temperature source Oral, resp. rate 20, height 5\' 10"  (1.778 m), weight 124.7 kg, SpO2 97%. Neurologic: Grossly normal Ambulating and voiding well incision cdi   Discharge Medications:   Allergies as of 08/06/2023       Reactions   Augmentin [amoxicillin-pot Clavulanate] Nausea And Vomiting   .Marland KitchenHas patient had a PCN reaction causing immediate rash, facial/tongue/throat swelling, SOB or lightheadedness with hypotension: No Has patient had a PCN  reaction causing severe rash involving mucus membranes or skin necrosis: No Has patient had a PCN reaction that required hospitalization No Has patient had a PCN reaction occurring within the last 10 years: No If all of the above answers are "NO", then may  proceed with Cephalosporin use.    Lithium Nausea And Vomiting   Sweating, and anxiety    Topamax [topiramate] Nausea And Vomiting        Medication List     TAKE these medications    acetaminophen 500 MG tablet Commonly known as: TYLENOL Take 1,000 mg by mouth every 6 (six) hours as needed for moderate pain (pain score 4-6).   cloNIDine 0.1 MG tablet Commonly known as: CATAPRES TAKE ONE TABLET BY MOUTH EVERY MORNING, TAKE ONE TABLET BY MOUTH AT NOON, AND TAKE TWO TABLETS BY MOUTH EVERY NIGHT AT BEDTIME What changed:  how much to take how to take this when to take this additional instructions   cyclobenzaprine 10 MG tablet Commonly known as: FLEXERIL Take 1 tablet (10 mg total) by mouth 3 (three) times daily as needed for muscle spasms.   HYDROcodone-acetaminophen 5-325 MG tablet Commonly known as: NORCO/VICODIN Take 1-2 tablets by mouth every 4 (four) hours as needed for severe pain (pain score 7-10).   lamoTRIgine 100 MG tablet Commonly known as: LAMICTAL TAKE ONE TABLET BY MOUTH THREE TIMES A DAY AND TAKE TWO TABLETS BY MOUTH EVERY NIGHT AT BEDTIME   LORazepam 1 MG tablet Commonly known as: ATIVAN Take 1 tablet (1 mg total) by mouth every 8 (eight) hours as needed.   Oxcarbazepine 300 MG tablet Commonly known as: TRILEPTAL TAKE 2 TABLETS BY MOUTH EVERY MORNING AND TAKE FOUR TABLETS BY MOUTH EVERY NIGHT   QUEtiapine 400 MG tablet Commonly known as: SEROQUEL Take 1 tablet (400 mg total) by mouth at bedtime.   sildenafil 100 MG tablet Commonly known as: VIAGRA TAKE ONE TABLET BY MOUTH DAILY AS NEEDED FOR ERECTILE DYSFUNCTION        Disposition: home   Final Dx: plif L4-5  Discharge Instructions     Face-to-face encounter (required for Medicare/Medicaid patients)   Complete by: As directed    I Sherryl Manges certify that this patient is under my care and that I, or a nurse practitioner or physician's assistant working with me, had a  face-to-face encounter that meets the physician face-to-face encounter requirements with this patient on 08/06/2023. The encounter with the patient was in whole, or in part for the following medical condition(s) which is the primary reason for home health care (List medical condition): s/p lumbar fusion   The encounter with the patient was in whole, or in part, for the following medical condition, which is the primary reason for home health care: s/p lumbar fusion   I certify that, based on my findings, the following services are medically necessary home health services: Physical therapy   Reason for Medically Necessary Home Health Services: Therapy- Investment banker, operational, Patent examiner   My clinical findings support the need for the above services: Pain interferes with ambulation/mobility   Further, I certify that my clinical findings support that this patient is homebound due to: Pain interferes with ambulation/mobility   Home Health   Complete by: As directed    To provide the following care/treatments: PT          Signed: Tiana Loft Elea Holtzclaw 08/06/2023, 7:44 AM

## 2023-08-06 NOTE — Progress Notes (Signed)
Patient alert and oriented, void, ambulate. D/c instruction explain and given all questions answered. Surgical site clean and dry no sign of infection. Patient d/c home per order.

## 2023-08-06 NOTE — Evaluation (Signed)
Physical Therapy Evaluation Patient Details Name: Johnny [name] MRN: [id number] DOB: [birth year] Today's Date: 08/06/2023  History of Present Illness  The pt is a 63 yo male presenting 1/29 for PLIF L4-5. PMH includes: anxiety, arthritis, depression, migraines, bipolar, obesity (BMI 39), and vertigo.   Clinical Impression  Pt in bed upon arrival of PT, agreeable to evaluation at this time. Prior to admission the pt was independent with use of cane, and reports 3-5 recent falls. The pt lives with his spouse in a 2-story home with 2 steps to enter and 15 to his office space. He reports his spouse is ordering a RW through [employer] and does not need the hospital to provide him with a RW at the time of d/c. The pt was able to complete bed mobility with cues for log roll, sit-stand transfers, hallway ambulation, and stairs with supervision, cues for technique, and use of RW for support. He benefits from continued cues for posture, hip extension, and increased stride clearance. Pt will benefit from continued skilled PT to progress activity tolerance, dynamic stability, and stair training. Pt educated on spinal precautions, use of brace, progressive walking program, and car transfers with pt reporting understanding.      If plan is discharge home, recommend the following: A little help with walking and/or transfers;A little help with bathing/dressing/bathroom;Assistance with cooking/housework;Assist for transportation;Help with stairs or ramp for entrance   Can travel by private vehicle        Equipment Recommendations  (pt reports they are buying a RW from [place] and do not need one provided by hospital)  Recommendations for Other Services       Functional Status Assessment Patient has had a recent decline in their functional status and demonstrates the ability to make significant improvements in function in a reasonable and predictable amount of time.     Precautions / Restrictions  Precautions Precautions: Back;Fall Precaution Booklet Issued: Yes (comment) Required Braces or Orthoses: Spinal Brace Spinal Brace: Lumbar corset Restrictions Weight Bearing Restrictions Per Provider Order: No      Mobility  Bed Mobility Overal bed mobility: Needs Assistance Bed Mobility: Sidelying to Sit, Rolling Rolling: Supervision, Used rails Sidelying to sit: Supervision, Used rails, HOB elevated       General bed mobility comments: cues for log roll, increased time with bed features    Transfers Overall transfer level: Needs assistance Equipment used: Rolling walker (2 wheels) Transfers: Sit to/from Stand Sit to Stand: Supervision           General transfer comment: cues for hand position, knee extension. slow to rise    Ambulation/Gait Ambulation/Gait assistance: Supervision Gait Distance (Feet): 250 Feet Assistive device: Rolling walker (2 wheels) Gait Pattern/deviations: Step-through pattern, Decreased stride length, Knee flexed in stance - right, Knee flexed in stance - left, Decreased dorsiflexion - right, Decreased dorsiflexion - left, Trunk flexed Gait velocity: slowed Gait velocity interpretation: <1.31 ft/sec, indicative of household ambulator   General Gait Details: pt with small steps, maintains knees and trunk flexed despite cues, progressively flexed with continued walking  Stairs Stairs:  (discussed verbally, sideways technique)             Balance Overall balance assessment: Needs assistance Sitting-balance support: Feet supported Sitting balance-[name]: Good     Standing balance support: Reliant on assistive device for balance Standing balance-[name]: Fair Standing balance comment: can static stand without DME, UE support for gait  Pertinent Vitals/Pain Pain Assessment Pain Assessment: Faces Pain Score: 7  Faces Pain Scale: Hurts little more Pain Location: Incisional Pain  Descriptors / Indicators: Aching Pain Intervention(s): Limited activity within patient's tolerance, Monitored during session, Repositioned, Patient requesting pain meds-RN notified    Home Living Family/patient expects to be discharged to:: Private residence Living Arrangements: Spouse/significant other Available Help at Discharge: Family;Available PRN/intermittently Type of Home: House Home Access: Stairs to enter Entrance Stairs-Rails: Right;Left Entrance Stairs-Number of Steps: 2 Alternate Level Stairs-Number of Steps: 15 Home Layout: Multi-level;Able to live on main level with bedroom/bathroom Home Equipment: Shower seat;Grab bars - tub/shower Additional Comments:  (does not need to go upstairs)    Prior Function Prior Level of Function : Independent/Modified Independent;Working/employed;Driving             Mobility Comments: had been going up stairs as recently as 2 days before surgery, reports 3-5 falls in last 6 months ADLs Comments: independent     Extremity/Trunk Assessment   Upper Extremity Assessment Upper Extremity Assessment: Defer to OT evaluation    Lower Extremity Assessment Lower Extremity Assessment: RLE deficits/detail;LLE deficits/detail RLE Deficits / Details: limited by pain, especially with knee flexion and hip flexion. reports sensation intact RLE: Unable to fully assess due to pain RLE Sensation: WNL RLE Coordination: WNL LLE Deficits / Details: limited by pain, especially with knee flexion and hip flexion. reports sensation intact LLE: Unable to fully assess due to pain LLE Sensation: WNL LLE Coordination: WNL    Cervical / Trunk Assessment Cervical / Trunk Assessment: Back Surgery  Communication   Communication Communication: No apparent difficulties Cueing Techniques: Verbal cues  Cognition Arousal: Alert Behavior During Therapy: WFL for tasks assessed/performed Overall Cognitive Status: Within Functional Limits for tasks assessed                                           General Comments General comments (skin integrity, edema, etc.): VSS onRA    Exercises     Assessment/Plan    PT Assessment Patient needs continued PT services  PT Problem List Decreased activity tolerance;Decreased balance;Decreased range of motion;Decreased strength;Obesity;Pain       PT Treatment Interventions DME instruction;Gait training;Stair training;Therapeutic activities;Functional mobility training;Therapeutic exercise;Balance training;Patient/family education    PT Goals (Current goals can be found in the Care Plan section)  Acute Rehab PT Goals Patient Stated Goal: return home PT Goal Formulation: With patient Time For Goal Achievement: 08/20/23 Potential to Achieve Goals: Good    Frequency Min 5X/week        AM-PAC PT "6 Clicks" Mobility  Outcome Measure Help needed turning from your back to your side while in a flat bed without using bedrails?: A Little Help needed moving from lying on your back to sitting on the side of a flat bed without using bedrails?: A Little Help needed moving to and from a bed to a chair (including a wheelchair)?: A Little Help needed standing up from a chair using your arms (e.g., wheelchair or bedside chair)?: A Little Help needed to walk in hospital room?: A Little Help needed climbing 3-5 steps with a railing? : A Little 6 Click Score: 18    End of Session Equipment Utilized During Treatment: Gait belt;Back brace Activity Tolerance: Patient tolerated treatment well Patient left: in bed;with call bell/phone within reach Nurse Communication: Mobility status;Patient requests pain meds PT Visit Diagnosis: Unsteadiness on  feet (R26.81);Repeated falls (R29.6);Muscle weakness (generalized) (M62.81);Pain Pain - part of body:  (back)    Time: 2130-8657 PT Time Calculation (min) (ACUTE ONLY): 28 min   Charges:   PT Evaluation $PT Eval Low Complexity: 1 Low PT  Treatments $Therapeutic Activity: 8-22 mins PT General Charges $$ ACUTE PT VISIT: 1 Visit         Vickki Muff, PT, DPT   Acute Rehabilitation Department Office [phone] Secure Chat Communication Preferred  Ronnie Derby 08/06/2023, 11:11 AM

## 2023-08-06 NOTE — TOC Transition Note (Signed)
Transition of Care [facility]) - Discharge Note   Patient Details  Name: Johnny [name] MRN: [id number] Date of Birth: 01-24-1961  [facility] CM/SW Contact:  Kermit Balo, RN Phone Number: 08/06/2023, 10:48 AM   Clinical Narrative:     Pt is discharging home with home PT through [facility]. Information on the AVS. Frances Furbish will contact him for the first home visit. Pt has transportation home.  Final next level of care: Home w Home Health Services Barriers to Discharge: No Barriers Identified   Patient Goals and CMS Choice   CMS Medicare.gov Compare Post Acute Care list provided to:: Patient Choice offered to / list presented to : Patient      Discharge Placement                       Discharge Plan and Services Additional resources added to the After Visit Summary for                            [facility] Arranged: PT [facility] Agency: [facility] Date [facility] Contacted: 08/06/23   Representative spoke with at [facility] Agency: Kandee Keen  Social Drivers of Health (SDOH) Interventions SDOH Screenings   Tobacco Use: Low Risk  (08/05/2023)     Readmission Risk Interventions     No data to display

## 2023-08-08 DIAGNOSIS — Z79899 Other long term (current) drug therapy: Secondary | ICD-10-CM | POA: Diagnosis not present

## 2023-08-08 DIAGNOSIS — I7300 Raynaud's syndrome without gangrene: Secondary | ICD-10-CM | POA: Diagnosis not present

## 2023-08-08 DIAGNOSIS — Z85828 Personal history of other malignant neoplasm of skin: Secondary | ICD-10-CM | POA: Diagnosis not present

## 2023-08-08 DIAGNOSIS — Z96651 Presence of right artificial knee joint: Secondary | ICD-10-CM | POA: Diagnosis not present

## 2023-08-08 DIAGNOSIS — Z981 Arthrodesis status: Secondary | ICD-10-CM | POA: Diagnosis not present

## 2023-08-08 DIAGNOSIS — G629 Polyneuropathy, unspecified: Secondary | ICD-10-CM | POA: Diagnosis not present

## 2023-08-08 DIAGNOSIS — G43909 Migraine, unspecified, not intractable, without status migrainosus: Secondary | ICD-10-CM | POA: Diagnosis not present

## 2023-08-08 DIAGNOSIS — Z4789 Encounter for other orthopedic aftercare: Secondary | ICD-10-CM | POA: Diagnosis not present

## 2023-08-08 DIAGNOSIS — M48061 Spinal stenosis, lumbar region without neurogenic claudication: Secondary | ICD-10-CM | POA: Diagnosis not present

## 2023-08-08 DIAGNOSIS — F319 Bipolar disorder, unspecified: Secondary | ICD-10-CM | POA: Diagnosis not present

## 2023-08-08 DIAGNOSIS — F419 Anxiety disorder, unspecified: Secondary | ICD-10-CM | POA: Diagnosis not present

## 2023-08-08 DIAGNOSIS — Z9181 History of falling: Secondary | ICD-10-CM | POA: Diagnosis not present

## 2023-08-08 DIAGNOSIS — M5416 Radiculopathy, lumbar region: Secondary | ICD-10-CM | POA: Diagnosis not present

## 2023-08-08 DIAGNOSIS — M4316 Spondylolisthesis, lumbar region: Secondary | ICD-10-CM | POA: Diagnosis not present

## 2023-08-10 ENCOUNTER — Ambulatory Visit: Payer: BC Managed Care – PPO | Admitting: Psychiatry

## 2023-08-10 NOTE — Progress Notes (Deleted)
Psychotherapy Progress Note [facility] [name], PhD LP  Patient ID: KAAN TOSH [facility])    MRN: [id number] Therapy format: {Therapy Types:21967::"Individual psychotherapy"} Date: 08/10/2023      Start: ***:***     Stop: ***:***     Time Spent: *** min Location: {SvcLoc:22530::"In-person"}   Session narrative (presenting needs, interim history, self-report of stressors and symptoms, applications of prior therapy, status changes, and interventions made in session) ***  Therapeutic modalities: {AM:23362::"Cognitive Behavioral Therapy","Solution-Oriented/Positive Psychology"}  Mental Status/Observations:  Appearance:   {PSY:22683}     Behavior:  {PSY:21022743}  Motor:  {PSY:22302}  Speech/Language:   {PSY:22685}  Affect:  {PSY:22687}  Mood:  {PSY:31886}  Thought process:  {PSY:31888}  Thought content:    {[phone]}  Sensory/Perceptual disturbances:    {PSY:8043816751}  Orientation:  {Psych Orientation:23301::"Fully oriented"}  Attention:  {Good-Fair-Poor ratings:23770::"Good"}    Concentration:  {Good-Fair-Poor ratings:23770::"Good"}  Memory:  {[phone]}  Insight:    {Good-Fair-Poor ratings:23770::"Good"}  Judgment:   {Good-Fair-Poor ratings:23770::"Good"}  Impulse Control:  {Good-Fair-Poor ratings:23770::"Good"}   Risk Assessment: Danger to Self: {Risk:22599::"No"} Self-injurious Behavior: {Risk:22599::"No"} Danger to Others: {Risk:22599::"No"} Physical Aggression / Violence: {Risk:22599::"No"} Duty to Warn: {AMYesNo:22526::"No"} Access to Firearms a concern: {AMYesNo:22526::"No"}  Assessment of progress:  {Progress:22147::"progressing"}  Diagnosis: No diagnosis found. Plan:  *** Other recommendations/advice -- As may be noted above.  Continue to utilize previously learned skills ad lib. Medication compliance -- Maintain medication as prescribed and work faithfully with relevant prescriber(s) if any changes are desired or seem  indicated. Crisis service -- Aware of call list and work-in appts.  Call the clinic on-call service, 988/hotline, 911, or present to [facility] or ER if any life-threatening psychiatric crisis. Followup -- No follow-ups on file.  Next scheduled visit with me 08/24/2023.  Next scheduled in this office 08/24/2023.  Robley Fries, PhD [name], PhD LP Clinical Psychologist, [facility] [address] [place], [place] [address] [phone]

## 2023-08-10 NOTE — Progress Notes (Signed)
Admin note for non-service contact  Patient ID: WILBERTH DAMON  MRN: [id number] DATE: 08/10/2023  No show for 4pm appt.  Known to have had lumbar surgery last week and an outpatient visit with PCP 1/31.  Phone call made 25 min after scheduled time to inquire, reached voicemail, left word to call back when able to clarify.  Notified by email as well, in light of no functioning MyChart and possible limitations with organization.  Currently scheduled beyond today for 2/17 and 3/03.  Willing to see this week without penalty on a second-chance basis by phone or video if he inquires with office.  Robley Fries, PhD [name], PhD LP Clinical Psychologist, [facility] [address] [place], [place] [address] [phone]

## 2023-08-11 DIAGNOSIS — Z9181 History of falling: Secondary | ICD-10-CM | POA: Diagnosis not present

## 2023-08-11 DIAGNOSIS — M5416 Radiculopathy, lumbar region: Secondary | ICD-10-CM | POA: Diagnosis not present

## 2023-08-11 DIAGNOSIS — M48061 Spinal stenosis, lumbar region without neurogenic claudication: Secondary | ICD-10-CM | POA: Diagnosis not present

## 2023-08-11 DIAGNOSIS — Z4789 Encounter for other orthopedic aftercare: Secondary | ICD-10-CM | POA: Diagnosis not present

## 2023-08-11 DIAGNOSIS — Z85828 Personal history of other malignant neoplasm of skin: Secondary | ICD-10-CM | POA: Diagnosis not present

## 2023-08-11 DIAGNOSIS — G43909 Migraine, unspecified, not intractable, without status migrainosus: Secondary | ICD-10-CM | POA: Diagnosis not present

## 2023-08-11 DIAGNOSIS — Z79899 Other long term (current) drug therapy: Secondary | ICD-10-CM | POA: Diagnosis not present

## 2023-08-11 DIAGNOSIS — Z96651 Presence of right artificial knee joint: Secondary | ICD-10-CM | POA: Diagnosis not present

## 2023-08-11 DIAGNOSIS — G629 Polyneuropathy, unspecified: Secondary | ICD-10-CM | POA: Diagnosis not present

## 2023-08-11 DIAGNOSIS — F419 Anxiety disorder, unspecified: Secondary | ICD-10-CM | POA: Diagnosis not present

## 2023-08-11 DIAGNOSIS — F319 Bipolar disorder, unspecified: Secondary | ICD-10-CM | POA: Diagnosis not present

## 2023-08-11 DIAGNOSIS — Z981 Arthrodesis status: Secondary | ICD-10-CM | POA: Diagnosis not present

## 2023-08-11 DIAGNOSIS — M4316 Spondylolisthesis, lumbar region: Secondary | ICD-10-CM | POA: Diagnosis not present

## 2023-08-11 DIAGNOSIS — I7300 Raynaud's syndrome without gangrene: Secondary | ICD-10-CM | POA: Diagnosis not present

## 2023-08-18 ENCOUNTER — Other Ambulatory Visit: Payer: Self-pay | Admitting: Psychiatry

## 2023-08-18 DIAGNOSIS — F411 Generalized anxiety disorder: Secondary | ICD-10-CM

## 2023-08-18 NOTE — Telephone Encounter (Signed)
Lf 1/12 lv 1/27

## 2023-08-24 ENCOUNTER — Ambulatory Visit: Payer: BC Managed Care – PPO | Admitting: Psychiatry

## 2023-09-07 ENCOUNTER — Ambulatory Visit (INDEPENDENT_AMBULATORY_CARE_PROVIDER_SITE_OTHER): Payer: BC Managed Care – PPO | Admitting: Psychiatry

## 2023-09-07 DIAGNOSIS — F3131 Bipolar disorder, current episode depressed, mild: Secondary | ICD-10-CM | POA: Diagnosis not present

## 2023-09-07 DIAGNOSIS — G3184 Mild cognitive impairment, so stated: Secondary | ICD-10-CM

## 2023-09-07 DIAGNOSIS — Z634 Disappearance and death of family member: Secondary | ICD-10-CM

## 2023-09-07 DIAGNOSIS — M48061 Spinal stenosis, lumbar region without neurogenic claudication: Secondary | ICD-10-CM | POA: Diagnosis not present

## 2023-09-07 DIAGNOSIS — Z9889 Other specified postprocedural states: Secondary | ICD-10-CM

## 2023-09-07 NOTE — Progress Notes (Signed)
 Psychotherapy Progress Note [facility] Johnny Ferry, PhD LP  Patient ID: Johnny [name])    MRN: [id number] Therapy format: Individual psychotherapy Date: 09/07/2023      Start: 4:20p     Stop: 5:10p     Time Spent: 50 min Location: In-person   Session narrative (presenting needs, interim history, self-report of stressors and symptoms, applications of prior therapy, status changes, and interventions made in session) S/p back surgery, with hernia belt and walker.  Glad to get out for a bit, first substantial drive today.  Making good use of no-tie shoes.  Johnny [name] has needed extra care for her liver condition, now needs a pulmonologist.  Showing some fluid, plus leg edema, now on diuretic, frequently urinating, on an inhaler (but sloughs off) and has been getting irritable.  Staying understanding so far, not letting himself get this back up about it.  Had an underfunctioning water  heater, thankfully able to repair at decent cost.    Back pain has been "brutal", though he is able to get around fairly at home, with cane or bariatric walker.  Loaning ex-wife the smaller walker, as she is getting TKR done next month.  Wants to find an outdoor walking area, looked online at nearby places in [place] that might suit, assisted in session.    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  Had a semi-flashback the one night, hearing Johnny [name] call to him in the night.  [place] not [place] ocean yet to [place] Johnny [name]'s ashes (understandably), while the ashes sit in the closet.  Able to deal with that, knows she's not there, just wants to get to the point where he can complete her wishes, preferably [place] used to visit.  1 yr since Johnny [name]'s sons's OD coming up fast.  Had 2 vomiting episodes suspected tied to taking hydrocodone  on an empty stomach.    Johnny [name] has been self-pitying lately, making wry, negative statements  about her body.  Supported in Johnny [name], academic and coached in constructively challenging and benevolent listening.  Therapeutic modalities: Cognitive Behavioral Therapy, Solution-Oriented/Positive Psychology, Environmental manager, and Faith-sensitive  Mental Status/Observations:  Appearance:   Casual     Behavior:  Appropriate  Motor:  Slowed for pain  Speech/Language:   Clear and Coherent  Affect:  Appropriate  Mood:  dysthymic  Thought process:  concrete  Thought content:    WNL  Sensory/Perceptual disturbances:    pain  Orientation:  Fully oriented  Attention:  Good    Concentration:  Fair  Memory:  WNL  Insight:    Fair  Judgment:   Good  Impulse Control:  Good   Risk Assessment: Danger to Self: No Self-injurious Behavior: No Danger to Others: No Physical Aggression / Violence: No Duty to Warn: No Access to Firearms a concern: No  Assessment of progress:  progressing  Diagnosis:   ICD-10-CM   1. Bipolar affective disorder, currently depressed, mild (HCC)  F31.31     2. Mild cognitive impairment with concussion hx  G31.84     3. Spinal stenosis of lumbar region, unspecified whether neurogenic claudication present  M48.061     4. Recent back surgery  Z98.890     5. Bereavement  Z63.4      Plan:  Acute ortho/neuro issue -- Ensure adequate understanding of medical recommendations and discussion if concerned, before deciding against recommended assessment and treatment, don't  assume self-defeating or doubtful things.  Endorse back surgery as planned, make sure to comprehend treatment recommendations and stretch and hydrate adequately for back health. Bereavement -- Continue working through estate tasks, if any remain.  Where needed, notice and forgive Johnny [name] passing on his watch.  Make free use of talking to her imaginally, if it will get things off his head or heart.  Where needed imagine her own reassuring that she is OK and nothing owed to her now.  Refresh promises of faith for  himself, as he did for her in life.  Griefshare program available if he regains interest. Support system -- Maintain personal support with Johnny [name] and any available friends.   Marital adjustment -- Work through [name], television/film/video how to live together constructively, watch for times when it may feel like dealing with ex, hold off jumping to defensive conclusions, be willing to check perceptions under stress.  Prioritize allying on practical solutions for issues and refusing to react like rivals.   Johnny [name]'s care -- Continue to work with Medicaid, [name], and [name] as appropriate.  Stay open to positive developments, ask before assuming the worse, and use calls and visits to foster more mature working relationship.  Look to future visits with him to practice coping with transitions and attachment to two homes. Family conflict -- Self-affirm good relationship with Johnny [name], continue retiring resentments with Johnny [name], focus on what he's building with Johnny [name] and welcoming the next generation.  S about allowing news to reach Johnny [name] and subdue worry. Other health care -- May need to reexamine sleep apnea and treatment or other interferences with sleep.  Follow through tending to teeth.  Maintain abstinence from alcohol and drugs.  Stretching and reasonable exercise for orthopedic issues and weight.  Maintain B12 or B complex supplementation. Other recommendations/advice -- As may be noted above.  Continue to utilize previously learned skills ad lib. Medication compliance -- Maintain medication as prescribed and work faithfully with relevant prescriber(s) if any changes are desired or seem indicated. Crisis service -- Aware of call list and work-in appts.  Call the clinic on-call service, 988/hotline, 911, or present to [name] or ER if any life-threatening psychiatric crisis. Followup -- Return for time as available, may set up tele.  Next scheduled visit with me Visit date not found.  Next scheduled in this office Visit date not  found.  Johnny Shaper, PhD Johnny Ferry, PhD LP Clinical Psychologist, [facility] [address], [address] [place], [place] [address] [phone]

## 2023-09-10 DIAGNOSIS — M4316 Spondylolisthesis, lumbar region: Secondary | ICD-10-CM | POA: Diagnosis not present

## 2023-09-21 ENCOUNTER — Encounter: Payer: Self-pay | Admitting: Psychiatry

## 2023-09-21 ENCOUNTER — Ambulatory Visit (INDEPENDENT_AMBULATORY_CARE_PROVIDER_SITE_OTHER): Admitting: Psychiatry

## 2023-09-21 DIAGNOSIS — F5105 Insomnia due to other mental disorder: Secondary | ICD-10-CM

## 2023-09-21 DIAGNOSIS — G4710 Hypersomnia, unspecified: Secondary | ICD-10-CM

## 2023-09-21 DIAGNOSIS — F3131 Bipolar disorder, current episode depressed, mild: Secondary | ICD-10-CM

## 2023-09-21 DIAGNOSIS — R7989 Other specified abnormal findings of blood chemistry: Secondary | ICD-10-CM | POA: Diagnosis not present

## 2023-09-21 DIAGNOSIS — G3184 Mild cognitive impairment, so stated: Secondary | ICD-10-CM

## 2023-09-21 DIAGNOSIS — F411 Generalized anxiety disorder: Secondary | ICD-10-CM | POA: Diagnosis not present

## 2023-09-21 DIAGNOSIS — G4730 Sleep apnea, unspecified: Secondary | ICD-10-CM

## 2023-09-21 NOTE — Progress Notes (Signed)
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        Mood and Affect: Mood is anxious. Mood is not depressed. Affect is not labile or angry.        Speech: Speech normal.        Behavior: Behavior normal. Behavior is not agitated or slowed. Behavior is cooperative.        Thought Content: Thought content is not paranoid or delusional. Thought content does not include homicidal or suicidal ideation. Thought content does not include suicidal plan.        Cognition and Memory: Cognition and memory normal.        Judgment: Judgment normal.     Comments: insight fair. Anger under control  ongoing distress and anxious with stressors..  but more hopeful with marriage.          Lab Review:     Component Value Date/Time   NA 133 (L) 07/31/2023 1337   K 4.6 07/31/2023 1337   CL 95 (L) 07/31/2023 1337   CO2 30 07/31/2023 1337   GLUCOSE 121 (H) 07/31/2023 1337   BUN 18 07/31/2023 1337   CREATININE 1.11 07/31/2023 1337   CALCIUM 9.2 07/31/2023 1337   PROT 6.4 03/18/2023 0945   ALBUMIN 4.4 06/26/2016 1521   AST 20 06/26/2016 1521   ALT 17 06/26/2016 1521   ALKPHOS 59 06/26/2016 1521   BILITOT 0.7 06/26/2016 1521   GFRNONAA >60 07/31/2023 1337   GFRAA >60 06/26/2016 1521       Component Value Date/Time   WBC 5.1 07/31/2023 1337   RBC 5.44 07/31/2023 1337   HGB  16.2  07/31/2023 1337   HCT 47.7 07/31/2023 1337   PLT 230 07/31/2023 1337   MCV 87.7 07/31/2023 1337   MCH 29.8 07/31/2023 1337   MCHC 34.0 07/31/2023 1337   RDW 12.9 07/31/2023 1337   LYMPHSABS 0.6 (L) 06/26/2016 1521   MONOABS 0.6 06/26/2016 1521   EOSABS 0.0 06/26/2016 1521   BASOSABS 0.0 06/26/2016 1521    No results found for: "POCLITH", "LITHIUM"   No results found for: "PHENYTOIN", "PHENOBARB", "VALPROATE", "CBMZ"   03/18/23 B12 low 215 [phone])  .res Assessment: Plan:    Bipolar affective disorder, currently depressed, mild (HCC)  Generalized anxiety disorder  Mild cognitive impairment  Low vitamin B12 level  Sleep apnea with hypersomnolence  Insomnia due to mental condition   30 min face to face time with patient.  We discussed Chronic depression, anxiety, insomnia, stress is ongoing and worse lately. Has never achieved freedom from symptoms.  No significant anger outbursts currently.  No unusual mood swings. Chronic severe stress dealing with severely mentally ill violent son and recent death of mother.  But much better affect and mood with recent positive changes.  We discussed his high dosages and polypharmacy that are medically necessary.  Disc SE risks esp sedation. Anger is better.   Stress with exW is better..  We discussed the short-term risks associated with benzodiazepines including sedation and increased fall risk among others.  Discussed long-term side effect risk including dependence, potential withdrawal symptoms, and the potential eventual dose-related risk of dementia.  But recent studies from 2020 dispute this association between benzodiazepines and dementia risk. Newer studies in 2020 do not support an association with dementia.  Disc in detail concerns about tolerance which may be developing. No SE  Discussed potential metabolic side effects associated with atypical antipsychotics, as well as potential risk for movement side effects. Advised pt  to contact office if movement side effects occur.  Disc risk sedation with current med regimen.  Consider Auvelity for chronic depression with different mechanism.  Defer a bit longer.  Discussed the polypharmacy which is not ideal but necessary.  He is taking .  DT stress can't go down further with meds.  Disc SE.  Lorazepam doesn't make him sleepy  No med changes.  Change insurance to [employer] mail service except lorazepam. Lamotrigine 100 TID and 200 HS for bipolar depression,  lorazepam for anxiety 1 mg TID  oxcarbazepine 300 mg tablets 2 every morning and 4 nightly for bipolar disorder,  clonidine 0.2 mg BID for  off label for anxiety and mixed bipolar sx and should also help htn noted.,  continue Seroquel 400 mg .  Increase helped.  Not taking B12: Needs to start B12 for low level 215.  This might help energy, strength , and cognition.   Hx low sodium too in 2017 07/31/23 sodium 133 Disc importance  of B12  Disc ED and meds for it.  Disc [facility]RX.  Sildenafil 100 tolerated prn.  FU 3 mos for support  Meredith Staggers MD, DFAPA  Please see After Visit Summary for patient specific instructions.  Future Appointments  Date Time Provider Department Center  11/03/2023  3:00 PM Robley Fries, PhD [facility] None  11/16/2023  4:00 PM Robley Fries, PhD [facility] None  12/03/2023  4:00 PM Robley Fries, PhD [facility] None  12/15/2023  3:00 PM Robley Fries, PhD [facility] None  12/28/2023  3:00 PM Robley Fries, PhD [facility] None  01/11/2024  3:00 PM Robley Fries, PhD [facility] None    No orders  of the defined types were placed in this encounter.      -------------------------------

## 2023-09-21 NOTE — Patient Instructions (Signed)
Resume B12.

## 2023-10-17 ENCOUNTER — Other Ambulatory Visit: Payer: Self-pay | Admitting: Psychiatry

## 2023-10-17 DIAGNOSIS — F411 Generalized anxiety disorder: Secondary | ICD-10-CM

## 2023-10-22 DIAGNOSIS — M4316 Spondylolisthesis, lumbar region: Secondary | ICD-10-CM | POA: Diagnosis not present

## 2023-11-02 NOTE — Progress Notes (Signed)
 Psychotherapy Progress Note [facility] [name], PhD LP  Patient ID: [name])    MRN: [id number] Therapy format: Individual psychotherapy Date: 11/03/2023      Start: 3:20p     Stop: 4:20p     Time Spent: 60 min Location: In-person   Session narrative (presenting needs, interim history, self-report of stressors and symptoms, applications of prior therapy, status changes, and interventions made in session) Back improving, on cane today, no brace.  X rays show spine fusing on schedule.  "Did something stupid" Saturday trying to carry groceries and fell.  Frustrated with heavy pollen season preventing him from getting out and walking, otherwise has a very pleasant place to walk.  Difficult month for other reasons.  [name] got terminated, after being told to go get assessed and cared for, out a couple of weeks.  While she is at one of the specialist's office's [employer] delivered her termination letter, with the unfortunate language that she has  "abandoned" her position.  She seems to have a case for unemployment, also got a referral to an employment attorney, though hard to reach.  She did get awarded STD, apparently, which will be a help.  [name] also got approved for SSDI, will begin collecting soon.  [name]'s liver issues is down to 23% function, having mystery uterine bleeding.  Will be applying to SSDI herself.  She did have a hysteroscopy, D&C, found polyps and dysplasias not cancer.  Would have wanted to do a proper hysterectomy, but her platelets are too low.  Meanwhile, she's depressed, which depresses him, and she's been too anxious about driving to unfamiliar places, depends on him.  He's been 6 weeks now needed to be close, except when working upstairs.  She's also asked if something's changed with his attitude toward her; able to respond without amping up anger or uneasiness.  Agreed she has the shadow over her of potentially dying from her liver disease, which she  has mentioned herself.  Naturally hard to think about, but assured that if he is ready to decode her and speak the unspeakable that way, it will most likely help her calm down and therefore help him live more comfortably, not work up the next learned aversion to his wife.  In other commentary, needs to arrange [name]'s insurance.  Grateful himself for marketplace plan, which has sustained him through a number of tough years.  Therapeutic modalities: Cognitive Behavioral Therapy, Solution-Oriented/Positive Psychology, and Ego-Supportive  Mental Status/Observations:  Appearance:   Casual     Behavior:  Appropriate  Motor:  Normal and some painful gait, improving  Speech/Language:   Clear and Coherent  Affect:  Appropriate  Mood:  dysthymic and more positive  Thought process:  normal  Thought content:    WNL  Sensory/Perceptual disturbances:    WNL  Orientation:  Fully oriented  Attention:  Good    Concentration:  Good  Memory:  grossly intact  Insight:    Variable  Judgment:   Good  Impulse Control:  Good   Risk Assessment: Danger to Self: No Self-injurious Behavior: No Danger to Others: No Physical Aggression / Violence: No Duty to Warn: No Access to Firearms a concern: No  Assessment of progress:  progressing  Diagnosis:   ICD-10-CM   1. Bipolar affective disorder, currently depressed, mild (HCC)  F31.31     2. Generalized anxiety disorder  F41.1     3. Mild cognitive impairment  G31.84     4. Spinal  stenosis of lumbar region, unspecified whether neurogenic claudication present  M48.061     5. Recent back surgery  Z98.890     6. Caregiver stress  Z63.6      Plan:  Acute ortho/neuro issue -- Ensure adequate understanding of medical recommendations and discussion if concerned, before deciding against recommended assessment and treatment, don't assume self-defeating or doubtful things.  Endorse back surgery as planned, make sure to comprehend treatment recommendations and  stretch and hydrate adequately for back health. Bereavement -- Continue working through estate tasks, if any remain.  Where needed, notice and forgive [name].  Make free use of talking to her imaginally, if it will get things off his head or heart.  Where needed imagine her own reassuring that she is OK and nothing owed to her now.  Refresh promises of faith for himself, as he did for her in life.  Griefshare program available if he regains interest. Support system -- Maintain personal support with [name] and any available friends.   Marital adjustment -- Work through [name], television/film/video how to live together constructively, watch for times when it may feel like dealing with ex, hold off jumping to defensive conclusions, be willing to check perceptions under stress.  Prioritize allying on practical solutions for issues and refusing to react like rivals.   [name]'s care -- Continue to work with Medicaid, [name], and [name] as appropriate.  Stay open to positive developments, ask before assuming the worse, and use calls and visits to foster more mature working relationship.  Look to future visits with him to practice coping with transitions and attachment to two homes. Family conflict -- Self-affirm good relationship with [name], continue retiring resentments with [name], focus on what he's building with [name] and welcoming the next generation.  S about allowing news to reach [name] and subdue worry. Other health care -- May need to reexamine sleep apnea and treatment or other interferences with sleep.  Follow through tending to teeth.  Maintain abstinence from alcohol and drugs.  Stretching and reasonable exercise for orthopedic issues and weight.  Maintain B12 or B complex supplementation. Other recommendations/advice -- As may be noted above.  Continue to utilize previously learned skills ad lib. Medication compliance -- Maintain medication as prescribed and work faithfully with relevant prescriber(s)  if any changes are desired or seem indicated. Crisis service -- Aware of call list and work-in appts.  Call the clinic on-call service, 988/hotline, 911, or present to [facility] or ER if any life-threatening psychiatric crisis. Followup -- Return for time as already scheduled.  Next scheduled visit with me 11/16/2023.  Next scheduled in this office 11/16/2023.  Maretta Shaper, PhD [name], PhD LP Clinical Psychologist, [facility] [address] [place], [place] [address] [phone]

## 2023-11-03 ENCOUNTER — Ambulatory Visit (INDEPENDENT_AMBULATORY_CARE_PROVIDER_SITE_OTHER): Admitting: Psychiatry

## 2023-11-03 DIAGNOSIS — G3184 Mild cognitive impairment, so stated: Secondary | ICD-10-CM

## 2023-11-03 DIAGNOSIS — F3131 Bipolar disorder, current episode depressed, mild: Secondary | ICD-10-CM

## 2023-11-03 DIAGNOSIS — F411 Generalized anxiety disorder: Secondary | ICD-10-CM

## 2023-11-03 DIAGNOSIS — M48061 Spinal stenosis, lumbar region without neurogenic claudication: Secondary | ICD-10-CM | POA: Diagnosis not present

## 2023-11-03 DIAGNOSIS — Z636 Dependent relative needing care at home: Secondary | ICD-10-CM

## 2023-11-03 DIAGNOSIS — Z9889 Other specified postprocedural states: Secondary | ICD-10-CM

## 2023-11-16 ENCOUNTER — Ambulatory Visit (INDEPENDENT_AMBULATORY_CARE_PROVIDER_SITE_OTHER): Admitting: Psychiatry

## 2023-11-16 DIAGNOSIS — M48061 Spinal stenosis, lumbar region without neurogenic claudication: Secondary | ICD-10-CM

## 2023-11-16 DIAGNOSIS — Z638 Other specified problems related to primary support group: Secondary | ICD-10-CM

## 2023-11-16 DIAGNOSIS — F3131 Bipolar disorder, current episode depressed, mild: Secondary | ICD-10-CM

## 2023-11-16 DIAGNOSIS — Z9889 Other specified postprocedural states: Secondary | ICD-10-CM

## 2023-11-16 DIAGNOSIS — G3184 Mild cognitive impairment, so stated: Secondary | ICD-10-CM

## 2023-11-16 DIAGNOSIS — Z636 Dependent relative needing care at home: Secondary | ICD-10-CM

## 2023-11-16 DIAGNOSIS — F411 Generalized anxiety disorder: Secondary | ICD-10-CM | POA: Diagnosis not present

## 2023-11-16 NOTE — Progress Notes (Addendum)
 Psychotherapy Progress Note [facility] [name], PhD LP  Patient ID: Johnny [name])    MRN: [id number] Therapy format: Individual psychotherapy Date: 11/16/2023      Start: 4:25p     Stop: 5:15p     Time Spent: 50 min Location: In-person   Session narrative (presenting needs, interim history, self-report of stressors and symptoms, applications of prior therapy, status changes, and interventions made in session) Getting some old customers back recently , which helps.  Successfully got Johnny [name] connected with a marketplace plan, and he is on Medicare of May 1.    Johnny [name] still sick, still bleeding, suspected working up anemia, and on top of it broke her toe.  She is bothered a lot by being let go, but still getting a lot of interest from a company in [employer] [name], which presents its own dilemmas about moving vs. commuting vs. telecommuting (if available), if it comes through, not to mention whether she will be well enough to sustain the job in the first place.  Johnny [name] feels she's done a 180 about the job, thought he had agreement that it was prohibitive, but she is strongly considering, and there was a blowup over the weekend that even hinted at effectively separating over it.  Clarified values with Johnny [name], who has been thinking about it and would give his reluctant blessing if she is set on trying to commute weekly, say.  Reminded to ask about telecommuting at least part time and reminded that if events conspired to see him relocate, he would still be free to be served by Johnny [name], just more likely to want to incorporate telehealth to spare driving.  Overall, recommend asking the frank, uncomfortable questions whether realities of her health make it feasible or wise, try to be open to hearing her point of view and draw out her reasoning, and hold off reacting since it's liable to bias her against hearing him out and help both of them reenact the conflict that jaded them both in  their previous marriages.  It's compounded by talking with Johnny [name], who remains difficult to deal with and frequently triggers the perception of self-serving or using him, and certainly by the recent death of his old friend Johnny [name], former DJ at [name]  Johnny [name] attended the funeral, and Johnny [name] sent an accusatory text intimating that Johnny [name] should have invited her, which provoked him.  On balance, he was able to keep it business replying to her, effectively saying it wasn't his place to do that, it was the family's, and they wanted a tight circle of the closest people.  Had to phone her also about Johnny [name], and about Johnny [name] and baby Johnny [name] visiting, fielded more questions from her about Johnny [name], which somehow led to Johnny [name]'s unguarded observation and commentary about Johnny [name]'s trans son speaking, at which Johnny [name] compulsively challenged him about his views.  Support/validation provided, encouraged in just not taking the bait if his faith is offended, basically spare himself the strain by letting Johnny [name] have her views, not to mention the compulsion to fight his.  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  Affirmed and encouraged.  Therapeutic modalities: Cognitive Behavioral Therapy, Solution-Oriented/Positive Psychology, Ego-Supportive, and Faith-sensitive  Mental Status/Observations:  Appearance:   Casual     Behavior:  Appropriate  Motor:  Normal and exc cane  Speech/Language:   Clear and Coherent  Affect:  Appropriate  Mood:  More even, situationally distressed  Thought process:  normal  Thought content:    WNL  Sensory/Perceptual disturbances:    WNL  Orientation:  Fully oriented  Attention:  Good    Concentration:  Good  Memory:  grossly intact  Insight:    Good  Judgment:   Good  Impulse Control:  Good   Risk Assessment: Danger to Self: No Self-injurious Behavior: No Danger to Others: No Physical  Aggression / Violence: No Duty to Warn: No Access to Firearms a concern: No  Assessment of progress:  progressing  Diagnosis:   ICD-10-CM   1. Bipolar affective disorder, currently depressed, mild (HCC)  F31.31     2. Generalized anxiety disorder  F41.1     3. Mild cognitive impairment  G31.84     4. Spinal stenosis of lumbar region, unspecified whether neurogenic claudication present  M48.061     5. Recent back surgery  Z98.890     6. Caregiver stress  Z63.6     7. Relationship problem with ex spouse  Z63.8      Plan:  Acute ortho/neuro issue -- Ensure adequate understanding of medical recommendations and discussion if concerned, before deciding against recommended assessment and treatment, don't assume self-defeating or doubtful things.  Endorse back surgery as planned, make sure to comprehend treatment recommendations and stretch and hydrate adequately for back health. Bereavement -- Continue working through estate tasks, if any remain.  Where needed, notice and forgive Johnny [name] on his watch.  Make free use of talking to her imaginally, if it will get things off his head or heart.  Where needed imagine her own reassuring that she is OK and nothing owed to her now.  Refresh promises of faith for himself, as he did for her in life.  Griefshare program available if he regains interest. Support system -- Maintain personal support with Johnny [name] and any available friends.   Marital adjustment -- Work through [name], television/film/video how to live together constructively, watch for times when it may feel like dealing with ex, hold off jumping to defensive conclusions, be willing to check perceptions under stress.  Prioritize allying on practical solutions for issues and refusing to react like rivals.   [name]'s care -- Continue to work with Medicaid, [name], and [name] as appropriate.  Stay open to positive developments, ask before assuming the worse, and use calls and visits to foster more mature working  relationship.  Look to future visits with him to practice coping with transitions and attachment to two homes. Family conflict -- Self-affirm good relationship with Isa [name], continue retiring resentments with Johnny [name], focus on what he's building with Johnny [name] and welcoming the next generation.  S about allowing news to reach Johnny [name] and subdue worry. Other health care -- May need to reexamine sleep apnea and treatment or other interferences with sleep.  Follow through tending to teeth.  Maintain abstinence from alcohol and drugs.  Stretching and reasonable exercise for orthopedic issues and weight.  Maintain B12 or B complex supplementation. Other recommendations/advice -- As may be noted above.  Continue to utilize previously learned skills ad lib. Medication compliance -- Maintain medication as prescribed and work faithfully with relevant prescriber(s) if any changes are desired or seem indicated. Crisis service -- Aware of call list and work-in appts.  Call the clinic on-call service, 988/hotline, 911, or present to [name] or ER if any life-threatening psychiatric crisis. Followup -- Return for time as already scheduled.  Next scheduled visit with me 12/03/2023.  Next scheduled in this office 12/03/2023.  Maretta Shaper, PhD [name], PhD LP Clinical Psychologist, [name] [address], [address] [name], [place] [address] [phone]

## 2023-11-17 ENCOUNTER — Other Ambulatory Visit: Payer: Self-pay | Admitting: Psychiatry

## 2023-11-17 DIAGNOSIS — F411 Generalized anxiety disorder: Secondary | ICD-10-CM

## 2023-12-03 ENCOUNTER — Ambulatory Visit (INDEPENDENT_AMBULATORY_CARE_PROVIDER_SITE_OTHER): Admitting: Psychiatry

## 2023-12-03 DIAGNOSIS — G3184 Mild cognitive impairment, so stated: Secondary | ICD-10-CM

## 2023-12-03 DIAGNOSIS — F411 Generalized anxiety disorder: Secondary | ICD-10-CM | POA: Diagnosis not present

## 2023-12-03 DIAGNOSIS — F3131 Bipolar disorder, current episode depressed, mild: Secondary | ICD-10-CM | POA: Diagnosis not present

## 2023-12-03 DIAGNOSIS — Z636 Dependent relative needing care at home: Secondary | ICD-10-CM

## 2023-12-03 DIAGNOSIS — Z9889 Other specified postprocedural states: Secondary | ICD-10-CM

## 2023-12-03 NOTE — Progress Notes (Signed)
 Psychotherapy Progress Note [name] [name], PhD LP  Patient ID: Johnny [name])    MRN: [id number] Therapy format: Individual psychotherapy Date: 12/03/2023      Start: 4:12p     Stop: 5:19p     Time Spent: 67 min Location: In-person   Session narrative (presenting needs, interim history, self-report of stressors and symptoms, applications of prior therapy, status changes, and interventions made in session) No more provocations from Johnny [name]  Johnny [name], on the other hand, has been sicker, with spotting turning more aggressive.  D&C a month ago only temporary help, just had to choose it while she was unsafe for hysterectomy, which is now scheduled in 3 weeks.  Attributing her moodiness to pain, anemia, and unwanted changes.  Has had to ask her to please calm down but got told not to "tell" her to calm down.  Endorsed asking nicely, suggested he may get farther asking her if she means mean things the way they sound, or is she trying to get it out how her situation hurts.  Narrates some exchanges where she has come back appreciative or apologetic after he let some harshness pass and   Financially, he has found blessings working out in Johnny [name] and Johnny [name] coming on line as intended, without government catastrophe.  Inspirational moment lately finding writing he did when he was in the ordeal of caring for his mother, including a sign representing answered prayer, saying "God's got this".    Meanwhile, news of Johnny [name] going to the beach with his therapeutic family, and 10 min from the house started his old habit of saying vile things to his foster mom, then claiming to hear voices and needing to go to the hospital, so they took him.  At this point, he has gone through a hospital stay, but came back cooperative.  Does feel the [name] parents are sufficiently aware of the game he's playing, and the breakup he had with his "girlfriend" of 2 yrs before the trip.  Johnny [name], for her  part, allegedly mishandled it, confronting the couple about having promised they'd only work with one client at a time (reference?).    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, unexpectedly, which is actually a blessing   Therapeutic modalities: Engineer, manufacturing systems Therapy, Solution-Oriented/Positive Psychology, Environmental manager, and Faith-sensitive  Mental Status/Observations:  Appearance:   Casual     Behavior:  Appropriate  Motor:  Normal and slowed for back pain  Speech/Language:   Clear and Coherent  Affect:  Appropriate  Mood:  dysthymic  Thought process:  normal  Thought content:    WNL  Sensory/Perceptual disturbances:    WNL  Orientation:  Fully oriented  Attention:  Good    Concentration:  Good  Memory:  WNL  Insight:    Variable  Judgment:   Good  Impulse Control:  Good   Risk Assessment: Danger to Self: No Self-injurious Behavior: No Danger to Others: No Physical Aggression / Violence: No Duty to Warn: No Access to Firearms a concern: No  Assessment of progress:  progressing  Diagnosis:   ICD-10-CM   1. Bipolar affective disorder, currently depressed, mild (Johnny [name])  F31.31     2. Caregiver stress  Z63.6     3. Generalized anxiety disorder  F41.1     4. Mild cognitive impairment  G31.84     5. Recent back surgery  Z98.890      Plan:  Acute ortho/neuro issue -- Ensure adequate understanding of  medical recommendations and discussion if concerned, before deciding against recommended assessment and treatment, don't assume self-defeating or doubtful things.  Endorse back surgery as planned, make sure to comprehend treatment recommendations and stretch and hydrate adequately for back health. Bereavement -- Continue working through estate tasks, if any remain.  Where needed, notice and forgive Johnny [name] passing on his watch.  Make free use of talking to her imaginally, if it will get things off his head or heart.  Where needed imagine her own reassuring that she is  OK and nothing owed to her now.  Refresh promises of faith for himself, as he did for her in life.  Griefshare program available if he regains interest. Support system -- Maintain personal support with Johnny [name] and any available friends.   Marital adjustment -- Work through Johnny [name] how to live together constructively, watch for times when it may feel like dealing with ex, hold off jumping to defensive conclusions, be willing to check perceptions under stress.  Prioritize allying on practical solutions for issues and refusing to react like rivals.  Use tips for respectfully message checking with Johnny [name] and asking her to consider, at discretion, how expressed negativity works. Johnny [name]'s care -- Continue to work with Medicaid, [name], and the Johnny [name] as appropriate.  Stay open to positive developments, ask before assuming the worse, and use calls and visits to foster more mature working relationship.  Look to future visits with him to practice coping with transitions and attachment to two homes. Family conflict -- Self-affirm good relationship with Johnny [name], continue retiring resentments with Johnny [name], focus on what he's building with Johnny [name] and welcoming the next generation.  S about allowing news to reach Johnny [name] and subdue worry. Other health care -- May need to reexamine sleep apnea and treatment or other interferences with sleep.  Follow through tending to teeth.  Maintain abstinence from alcohol and drugs.  Stretching and reasonable exercise for orthopedic issues and weight.  Maintain B12 or B complex supplementation. Other recommendations/advice -- As may be noted above.  Continue to utilize previously learned skills ad lib. Medication compliance -- Maintain medication as prescribed and work faithfully with relevant prescriber(s) if any changes are desired or seem indicated. Crisis service -- Aware of call list and work-in appts.  Call the clinic on-call service, Johnny [name], Johnny [name], or present to Johnny [name] or Johnny [name] if any  life-threatening psychiatric crisis. Followup -- No follow-ups on file.  Next scheduled visit with me 12/15/2023.  Next scheduled in this office 12/15/2023.  Maretta Shaper, PhD [name], PhD LP Clinical Psychologist, [name] [address] [place], [place] [address] [phone]

## 2023-12-11 ENCOUNTER — Other Ambulatory Visit: Payer: Self-pay | Admitting: Psychiatry

## 2023-12-11 DIAGNOSIS — F3162 Bipolar disorder, current episode mixed, moderate: Secondary | ICD-10-CM

## 2023-12-11 MED ORDER — LAMOTRIGINE 100 MG PO TABS
ORAL_TABLET | ORAL | 0 refills | Status: AC
Start: 2023-12-11 — End: ?

## 2023-12-11 MED ORDER — OXCARBAZEPINE 300 MG PO TABS: ORAL_TABLET | ORAL | 0 refills | Status: DC

## 2023-12-11 MED ORDER — QUETIAPINE FUMARATE 400 MG PO TABS
400.0000 mg | ORAL_TABLET | Freq: Every day | ORAL | 0 refills | Status: AC
Start: 2023-12-11 — End: ?

## 2023-12-15 ENCOUNTER — Ambulatory Visit (INDEPENDENT_AMBULATORY_CARE_PROVIDER_SITE_OTHER): Admitting: Psychiatry

## 2023-12-15 DIAGNOSIS — Z638 Other specified problems related to primary support group: Secondary | ICD-10-CM

## 2023-12-15 DIAGNOSIS — Z636 Dependent relative needing care at home: Secondary | ICD-10-CM | POA: Diagnosis not present

## 2023-12-15 DIAGNOSIS — F411 Generalized anxiety disorder: Secondary | ICD-10-CM | POA: Diagnosis not present

## 2023-12-15 DIAGNOSIS — F3131 Bipolar disorder, current episode depressed, mild: Secondary | ICD-10-CM

## 2023-12-15 DIAGNOSIS — G3184 Mild cognitive impairment, so stated: Secondary | ICD-10-CM | POA: Diagnosis not present

## 2023-12-15 NOTE — Progress Notes (Signed)
 Psychotherapy Progress Note [facility] Johnny Ferry, PhD LP  Patient ID: Johnny [name])    MRN: [id number] Therapy format: Individual psychotherapy Date: 12/15/2023      Start: 3:20p     Stop: 4:10p     Time Spent: 50 min Location: In-person   Session narrative (presenting needs, interim history, self-report of stressors and symptoms, applications of prior therapy, status changes, and interventions made in session) Johnny [name] bleeding is worse.  Had her IUD out, which had been placed as a helpful estrogen source.  Now set for a total hysterectomy on the 19th.  Aware of the extent of surgery, and the convalescence to come, hopeful and prayerful.  As it stands, neither of them are sleeping well due to her pain, and sometimes Johnny [name] needs to crash in the living room.    Tough week and a half, starts to repeat himself in session about Johnny [name]'s hospitalization.  Understandably unsettled and limit-testing.  Also turns out to be true as Johnny [name] had exclaimed that the Johnny [name] couple are trying to manage 3 needy adults in their home, and Johnny [name] says Johnny [name], the male lead, has a habit of talking over Johnny [name]  (1st noted in all this time, may have been recent experience generalized.)  Says they had a phone conference since last seen that involved the Johnny [name] parents, Johnny [name], a pair of Johnny [name], Johnny [name], and -- belatedly -- Johnny [name]  Predictably negative about Johnny [name]' involvement and the Johnny [name] couple, complaining that they let Johnny [name] near the call and talked openly about him, provoking many worries about how they might provoke Johnny [name] and set him off further.  Learned the state is paying the same this year as last, best understanding that legislature-driven budget decisions denied COLA for the entire Medicaid program.  Understandably negative about state decision-making.    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  Support/validation provided.  And in some  irony, Johnny [name] keeps getting hits on her Johnny [name], which she obviously can't honestly handle in her condition.  Support/validation provided re surgery to come next week.  Video available if he is needed at home for recovery next time scheduled.  Therapeutic modalities: Cognitive Behavioral Therapy, Solution-Oriented/Positive Psychology, and Ego-Supportive  Mental Status/Observations:  Appearance:   Casual     Behavior:  Appropriate  Motor:  Slowed, with cane  Speech/Language:   Clear and Coherent  Affect:  Appropriate  Mood:  irritable and with subject  Thought process:  normal  Thought content:    WNL  Sensory/Perceptual disturbances:    WNL  Orientation:  Fully oriented  Attention:  Good    Concentration:  Good  Memory:  grossly intact  Insight:    Fair  Judgment:   Good  Impulse Control:  Good   Risk Assessment: Danger to Self: No Self-injurious Behavior: No Danger to Others: No Physical Aggression / Violence: No Duty to Warn: No Access to Firearms a concern: No  Assessment of progress:  stabilized  Diagnosis:   ICD-10-CM   1. Bipolar affective disorder, currently depressed, mild ([facility])  F31.31     2. Caregiver stress  Z63.6     3. Generalized anxiety disorder  F41.1     4. Mild cognitive impairment  G31.84     5. Relationship problem with ex spouse  Z63.8      Plan:  Acute ortho/neuro issue -- Ensure adequate understanding of medical recommendations and discussion if concerned, before deciding  against recommended assessment and treatment, don't assume self-defeating or doubtful things.  Endorse back surgery as planned, make sure to comprehend treatment recommendations and stretch and hydrate adequately for back health. Bereavement -- Continue working through estate tasks, if any remain.  Where needed, notice and forgive [name].  Make free use of talking to her imaginally, if it will get things off his head or heart.  Where needed imagine her own  reassuring that she is OK and nothing owed to her now.  Refresh promises of faith for himself, as he did for her in life.  Griefshare program available if he regains interest. Support system -- Maintain personal support with Johnny [name] and any available friends.   Marital adjustment -- Work through [name], television/film/video how to live together constructively, watch for times when it may feel like dealing with ex, hold off jumping to defensive conclusions, be willing to check perceptions under stress.  Prioritize allying on practical solutions for issues and refusing to react like rivals.  Use tips for respectfully message checking with Johnny [name] and asking her to consider, at discretion, how expressed negativity works. Johnny [name]'s care -- Continue to work with Medicaid, [name], and [name] as appropriate.  Stay open to positive developments, ask before assuming the worse, and use calls and visits to foster more mature working relationship.  Look to future visits with him to practice coping with transitions and attachment to two homes. Family conflict -- Self-affirm good relationship with Johnny [name], continue retiring resentments with Johnny [name], focus on what he's building with Johnny [name] and welcoming the next generation.  S about allowing news to reach Johnny [name] and subdue worry. Other health care -- May need to reexamine sleep apnea and treatment or other interferences with sleep.  Follow through tending to teeth.  Maintain abstinence from alcohol and drugs.  Stretching and reasonable exercise for orthopedic issues and weight.  Maintain B12 or B complex supplementation. Other recommendations/advice -- As may be noted above.  Continue to utilize previously learned skills ad lib. Medication compliance -- Maintain medication as prescribed and work faithfully with relevant prescriber(s) if any changes are desired or seem indicated. Crisis service -- Aware of call list and work-in appts.  Call the clinic on-call service, 988/hotline, 911, or present  to [facility] or ER if any life-threatening psychiatric crisis. Followup -- Return for time as already scheduled.  Next scheduled visit with me 12/28/2023.  Next scheduled in this office 12/22/2023.  Maretta Shaper, PhD Johnny Ferry, PhD LP Clinical Psychologist, [facility] [address] [place], [place] [address] [phone]

## 2023-12-18 ENCOUNTER — Other Ambulatory Visit: Payer: Self-pay | Admitting: Psychiatry

## 2023-12-18 DIAGNOSIS — F411 Generalized anxiety disorder: Secondary | ICD-10-CM

## 2023-12-22 ENCOUNTER — Ambulatory Visit (INDEPENDENT_AMBULATORY_CARE_PROVIDER_SITE_OTHER): Admitting: Psychiatry

## 2023-12-22 ENCOUNTER — Encounter: Payer: Self-pay | Admitting: Psychiatry

## 2023-12-22 DIAGNOSIS — G3184 Mild cognitive impairment, so stated: Secondary | ICD-10-CM

## 2023-12-22 DIAGNOSIS — F411 Generalized anxiety disorder: Secondary | ICD-10-CM

## 2023-12-22 DIAGNOSIS — G4730 Sleep apnea, unspecified: Secondary | ICD-10-CM

## 2023-12-22 DIAGNOSIS — F5105 Insomnia due to other mental disorder: Secondary | ICD-10-CM

## 2023-12-22 DIAGNOSIS — F3162 Bipolar disorder, current episode mixed, moderate: Secondary | ICD-10-CM

## 2023-12-22 DIAGNOSIS — Z9889 Other specified postprocedural states: Secondary | ICD-10-CM

## 2023-12-22 DIAGNOSIS — G4710 Hypersomnia, unspecified: Secondary | ICD-10-CM

## 2023-12-22 DIAGNOSIS — F3131 Bipolar disorder, current episode depressed, mild: Secondary | ICD-10-CM

## 2023-12-22 DIAGNOSIS — R7989 Other specified abnormal findings of blood chemistry: Secondary | ICD-10-CM

## 2023-12-22 MED ORDER — LORAZEPAM 1 MG PO TABS
1.0000 mg | ORAL_TABLET | Freq: Three times a day (TID) | ORAL | 3 refills | Status: DC | PRN
Start: 2023-12-22 — End: 2024-06-01

## 2023-12-22 MED ORDER — QUETIAPINE FUMARATE 400 MG PO TABS: 400.0000 mg | ORAL_TABLET | Freq: Every day | ORAL | 1 refills | Status: DC

## 2023-12-22 MED ORDER — LAMOTRIGINE 100 MG PO TABS: ORAL_TABLET | ORAL | 5 refills | Status: DC

## 2023-12-22 MED ORDER — OXCARBAZEPINE 300 MG PO TABS: ORAL_TABLET | ORAL | 1 refills | Status: DC

## 2023-12-22 MED ORDER — CLONIDINE HCL 0.1 MG PO TABS: ORAL_TABLET | ORAL | 1 refills | Status: DC

## 2023-12-22 NOTE — Progress Notes (Signed)
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 appropriate.  Son should be in group home: taking Seroquel , Depakote and Zoloft  50.  Divorced. Still goes for exercise.   Latest sleep study negative for OSA but some years ago.  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  Please see review of systems for further details on the patient's review from today.   Objective:   Physical Exam:  There were no vitals taken for this visit.  Physical Exam Constitutional:      General: He is not in acute distress.    Appearance: He is well-developed. He is obese.   Musculoskeletal:        General: No deformity.   Neurological:     Mental Status: He is alert and oriented to person, place, and time.     Cranial Nerves: No dysarthria.     Coordination: Coordination abnormal.     Comments: Using cane  Psychiatric:        Attention and Perception: Attention and perception normal. He does not perceive auditory or visual hallucinations.        Mood and Affect: Mood is anxious. Mood is not depressed. Affect is not labile or angry.        Speech: Speech normal.        Behavior: Behavior normal. Behavior is not agitated or slowed. Behavior is cooperative.        Thought Content: Thought content is not paranoid or delusional. Thought content does not include homicidal or suicidal ideation. Thought content does not include suicidal plan.        Cognition and Memory: Cognition and memory normal.        Judgment: Judgment normal.     Comments: insight fair. Anger under control  ongoing distress and anxious with  stressors of wife's  health px.         Lab  Review:     Component Value Date/Time   NA 133 (L) 07/31/2023 1337   K 4.6 07/31/2023 1337   CL 95 (L) 07/31/2023 1337   CO2 30 07/31/2023 1337   GLUCOSE 121 (H) 07/31/2023 1337   BUN 18 07/31/2023 1337   CREATININE 1.11 07/31/2023 1337   CALCIUM  9.2 07/31/2023 1337   PROT 6.4 03/18/2023 0945   ALBUMIN  4.4 06/26/2016 1521   AST 20 06/26/2016 1521   ALT 17 06/26/2016 1521   ALKPHOS 59 06/26/2016 1521   BILITOT 0.7 06/26/2016 1521   GFRNONAA >60 07/31/2023 1337   GFRAA >60 06/26/2016 1521       Component Value Date/Time   WBC 5.1 07/31/2023 1337   RBC 5.44 07/31/2023 1337   HGB 16.2 07/31/2023 1337   HCT 47.7 07/31/2023 1337   PLT 230 07/31/2023 1337   MCV 87.7 07/31/2023 1337   MCH 29.8 07/31/2023 1337   MCHC 34.0 07/31/2023 1337   RDW 12.9 07/31/2023 1337   LYMPHSABS 0.6 (L) 06/26/2016 1521   MONOABS 0.6 06/26/2016 1521   EOSABS 0.0 06/26/2016 1521   BASOSABS 0.0 06/26/2016 1521    No results found for: POCLITH, LITHIUM   No results found for: PHENYTOIN, PHENOBARB, VALPROATE, CBMZ   03/18/23 B12 low 215 [phone])  .res Assessment: Plan:    Bipolar affective disorder, currently depressed, mild (HCC)  Generalized anxiety disorder - Plan: LORazepam  (ATIVAN ) 1 MG tablet, cloNIDine  (CATAPRES ) 0.1 MG tablet  Mild cognitive impairment  Recent back surgery  Low vitamin B12 level  Sleep apnea with hypersomnolence  Insomnia due to mental condition  Moderate mixed bipolar I disorder (HCC) - Plan: QUEtiapine  (SEROQUEL ) 400 MG tablet, Oxcarbazepine  (TRILEPTAL ) 300 MG tablet, lamoTRIgine  (LAMICTAL ) 100 MG tablet   30 min face to face time with patient.  We discussed Chronic depression, anxiety, insomnia, stress is ongoing and worse lately. Has never achieved freedom from symptoms.  No significant anger outbursts currently.  No unusual mood swings. Chronic severe stress dealing with severely mentally ill  violent son and recent death of Johnny [name]  But much better affect and mood with recent positive changes.  We discussed his high dosages and polypharmacy that are medically necessary.  Disc SE risks esp sedation. Anger is better.   Stress with exW is better..  We discussed the short-term risks associated with benzodiazepines including sedation and increased fall risk among others.  Discussed long-term side effect risk including dependence, potential withdrawal symptoms, and the potential eventual dose-related risk of dementia.  But recent studies from 2020 dispute this association between benzodiazepines and dementia risk. Newer studies in 2020 do not support an association with dementia.  Disc in detail concerns about tolerance which may be developing. No SE  Discussed potential metabolic side effects associated with atypical antipsychotics, as well as potential risk for movement side effects. Advised pt to contact office if movement side effects occur.  Disc risk sedation with current med regimen.  Consider Auvelity for chronic depression with different mechanism.  Defer a bit longer.  Discussed the polypharmacy which is not ideal but necessary.  He is taking .  DT stress can't go down further with meds.  Disc SE.  Lorazepam  doesn't make him sleepy  No med changes.  Change insurance to [employer] mail service except lorazepam . Lamotrigine  100 TID and 200 HS for bipolar depression,  lorazepam  for anxiety 1 mg TID  oxcarbazepine  300 mg tablets 2 every morning and 4 nightly for bipolar disorder,  clonidine  0.2 mg BID  for  off label for anxiety and mixed bipolar sx and should also help htn noted.,  continue Seroquel  400 mg .  Increase helped.  Not taking B12: Needs to start B12 for low level 215.  This might help energy, strength , and cognition.   Hx low sodium too in 2017 07/31/23 sodium 133 Disc importance  of B12  Disc ED and meds for it.  Disc GoodRX.  Sildenafil  100 tolerated prn.  FU 3 mos for  support  Nori Beat MD, DFAPA  Please see After Visit Summary for patient specific instructions.  Future Appointments  Date Time Provider Department Center  12/28/2023  3:00 PM Maretta Shaper, PhD [facility] None  01/11/2024  3:00 PM Maretta Shaper, PhD [facility] None  01/25/2024  3:00 PM Maretta Shaper, PhD [facility] None    No orders of the defined types were placed in this encounter.      -------------------------------

## 2023-12-28 ENCOUNTER — Ambulatory Visit (INDEPENDENT_AMBULATORY_CARE_PROVIDER_SITE_OTHER): Admitting: Psychiatry

## 2023-12-28 DIAGNOSIS — F411 Generalized anxiety disorder: Secondary | ICD-10-CM

## 2023-12-28 DIAGNOSIS — Z62820 Parent-biological child conflict: Secondary | ICD-10-CM

## 2023-12-28 DIAGNOSIS — Z636 Dependent relative needing care at home: Secondary | ICD-10-CM

## 2023-12-28 DIAGNOSIS — F3131 Bipolar disorder, current episode depressed, mild: Secondary | ICD-10-CM | POA: Diagnosis not present

## 2023-12-28 DIAGNOSIS — Z630 Problems in relationship with spouse or partner: Secondary | ICD-10-CM

## 2023-12-28 NOTE — Progress Notes (Signed)
 Psychotherapy Progress Note [facility] [name], PhD LP  Patient ID: Johnny [name])    MRN: [id number] Therapy format: Individual psychotherapy Date: 12/28/2023      Start: 3:07p     Stop: 3:57p     Time Spent: 50 min Location: In-person   Session narrative (presenting needs, interim history, self-report of stressors and symptoms, applications of prior therapy, status changes, and interventions made in session) [name]'s surgery went well, grateful for prayer support.  Intrigued to see a new service in which the OR nurse texted him regularly through the procedure, est every 10 min or so.  Surgery was clear help for her bleeding problem, only needed 1 unit of blood in surgery, actually, with 2 units of platelets.  Thankful for fellow visitors and Christians around him while there.  Support/validation provided.   Father's Day with Ray went well, going to [place] to fetch him for the weekend.  No behavior issues throughout the weekend, just constipation for [name].  Continuing concern for strictness in how his [name] mother approaches him.    Continuing concern if/when [name] can return to work.  Currently on 6 weeks of extended STD for surgery.  Got asked if he's mad at her, which suggests more of her insecurity in the relationship; he is, actually, over luxury purchases of late, which he feels are excessive and unreasonable.  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  Now disagreements over whether to trade cars.  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  Coached in approaching [name] constructively and honestly about concerns, leading with securing agreements to talk about money and to not read anything in.  Therapeutic modalities: Cognitive Behavioral Therapy, Solution-Oriented/Positive Psychology, Environmental manager, and  Faith-sensitive  Mental Status/Observations:  Appearance:   Casual     Behavior:  Appropriate  Motor:  Normal  Speech/Language:   Clear and Coherent  Affect:  Appropriate  Mood:  dysthymic  Thought process:  normal  Thought content:    WNL  Sensory/Perceptual disturbances:    WNL  Orientation:  Fully oriented  Attention:  Good    Concentration:  Fair  Memory:  grossly intact  Insight:    Fair  Judgment:   Good  Impulse Control:  Good   Risk Assessment: Danger to Self: No Self-injurious Behavior: No Danger to Others: No Physical Aggression / Violence: No Duty to Warn: No Access to Firearms a concern: No  Assessment of progress:  stabilized  Diagnosis:   ICD-10-CM   1. Bipolar affective disorder, currently depressed, mild (HCC)  F31.31     2. Generalized anxiety disorder  F41.1     3. Caregiver stress  Z63.6     4. Relationship problem between parent and child  Z62.820     5. Relationship problem between partners  Z63.0      Plan:  Acute ortho/neuro issue -- Ensure adequate understanding of medical recommendations and discussion if concerned, before deciding against recommended assessment and treatment, don't assume self-defeating or doubtful things.  Endorse back surgery as planned, make sure to comprehend treatment recommendations and stretch and hydrate adequately for back health. Bereavement -- Continue working through estate tasks, if any remain.  Where needed, notice and forgive [name].  Make free use of talking to her imaginally, if it will get things off  his head or heart.  Where needed imagine her own reassuring that she is OK and nothing owed to her now.  Refresh promises of faith for himself, as he did for her in life.  Griefshare program available if he regains interest. Support system -- Maintain personal support with [name] and any available friends.   Marital adjustment -- Work through [name], television/film/video how to live together constructively, watch for  times when it may feel like dealing with ex, hold off jumping to defensive conclusions, be willing to check perceptions under stress.  Prioritize allying on practical solutions for issues and refusing to react like rivals.  Use tips for respectfully message checking with [name] and asking her to consider, at discretion, how expressed negativity works. [name]'s care -- Continue to work with Medicaid, [name], and [name] as appropriate.  Stay open to positive developments, ask before assuming the worse, and use calls and visits to foster more mature working relationship.  Look to future visits with him to practice coping with transitions and attachment to two homes. Family conflict -- Self-affirm good relationship with [name], continue retiring resentments with [name], focus on what he's building with [name] and welcoming the next generation.  S about allowing news to reach [name] and subdue worry. Other health care -- May need to reexamine sleep apnea and treatment or other interferences with sleep.  Follow through tending to teeth.  Maintain abstinence from alcohol and drugs.  Stretching and reasonable exercise for orthopedic issues and weight.  Maintain B12 or B complex supplementation. Other recommendations/advice -- As may be noted above.  Continue to utilize previously learned skills ad lib. Medication compliance -- Maintain medication as prescribed and work faithfully with relevant prescriber(s) if any changes are desired or seem indicated. Crisis service -- Aware of call list and work-in appts.  Call the clinic on-call service, 988/hotline, 911, or present to [facility] or ER if any life-threatening psychiatric crisis. Followup -- Return for time as already scheduled.  Next scheduled visit with me 01/11/2024.  Next scheduled in this office 01/11/2024.  Lamar Kendall, PhD [name], PhD LP Clinical Psychologist, [facility] [address] [place], [place] [address] [phone]

## 2023-12-31 DIAGNOSIS — K080 Exfoliation of teeth due to systemic causes: Secondary | ICD-10-CM | POA: Diagnosis not present

## 2024-01-11 ENCOUNTER — Ambulatory Visit (INDEPENDENT_AMBULATORY_CARE_PROVIDER_SITE_OTHER): Admitting: Psychiatry

## 2024-01-11 DIAGNOSIS — G3184 Mild cognitive impairment, so stated: Secondary | ICD-10-CM | POA: Diagnosis not present

## 2024-01-11 DIAGNOSIS — Z9889 Other specified postprocedural states: Secondary | ICD-10-CM

## 2024-01-11 DIAGNOSIS — M48061 Spinal stenosis, lumbar region without neurogenic claudication: Secondary | ICD-10-CM

## 2024-01-11 DIAGNOSIS — F3131 Bipolar disorder, current episode depressed, mild: Secondary | ICD-10-CM

## 2024-01-11 DIAGNOSIS — Z636 Dependent relative needing care at home: Secondary | ICD-10-CM

## 2024-01-11 DIAGNOSIS — F411 Generalized anxiety disorder: Secondary | ICD-10-CM

## 2024-01-11 NOTE — Progress Notes (Signed)
 Psychotherapy Progress Note [facility] Johnny Kendall, PhD LP  Patient ID: Johnny [name])    MRN: [id number] Therapy format: Individual psychotherapy Date: 01/11/2024      Start: 3:13p     Stop: 4:00p     Time Spent: 47 min Location: In-person   Session narrative (presenting needs, interim history, self-report of stressors and symptoms, applications of prior therapy, status changes, and interventions made in session) Johnny [name] situation is calming more.  Johnny [name] called last night, says he'd like to get a life size bunny suit to wear around.  Apparently hx of wanting to get into character, e.g., Spiderman, and going on a craze for a while.  Potentially he caught onto a now-passing trend for kids to adopt an animal identity and identify as furries.  Care team meeting coming this Friday 11am, not looking forward to the tedium of bringing along a Johnny [name] of people who aren't in touch with Johnny [name]'s behavior and needs, but willing.    Johnny [name] has been in some agony lately, with post-hysterectomy pain.  Had oophorectomy also, which may affect the experience.  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  They had a good cry together last night, and he is offering her full understanding any time she gets crabby.  Validated that it is scary, and that it is well worth assuming her irritability is a combination of anxiety about her health and anxiety about repeating a bad marriage.    Has found his will, from 2016, which needs updating.  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  However, her general stamina for any job is in serious question.  Still falling asleep often, even in the midst of something.  They have some cleaning help coming in to help now.  Worried that she will be stopped on LTD, but only because the latest  doctor had a temporary rating of disability due to surgery.  Assured that whichever doctor is next up about her liver, etc., and there should be no question she can be further certified for disability.  Rigmarole also going with her insurance denying CPAP, apparently for misreading a doctor's diagnosis.  Knows she has apnea, and narcoleptic-like sxs.  Support/validation provided working it out.  Therapeutic modalities: Cognitive Behavioral Therapy, Solution-Oriented/Positive Psychology, and Ego-Supportive  Mental Status/Observations:  Appearance:   Casual     Behavior:  Appropriate  Motor:  Slowed by back  Speech/Language:   Clear and Coherent  Affect:  Appropriate  Mood:  dysthymic  Thought process:  concrete  Thought content:    WNL  Sensory/Perceptual disturbances:    pain  Orientation:  Fully oriented  Attention:  Good    Concentration:  Good  Memory:  WNL  Insight:    Fair  Judgment:   Good  Impulse Control:  Good   Risk Assessment: Danger to Self: No Self-injurious Behavior: No Danger to Others: No Physical Aggression / Violence: No Duty to Warn: No Access to Firearms a concern: No  Assessment of progress:  stabilized  Diagnosis:   ICD-10-CM   1. Bipolar affective disorder, currently depressed, mild (HCC)  F31.31     2. Generalized anxiety disorder  F41.1     3. Caregiver stress  Z63.6     4. Mild cognitive impairment  G31.84  5. Recent back surgery  Z98.890     6. Spinal stenosis of lumbar region, unspecified whether neurogenic claudication present  M48.061      Plan:  Chronic ortho/neuro issues -- Ensure adequate understanding of medical recommendations and discussion if concerned, before deciding against recommended assessment and treatment, don't assume self-defeating or doubtful things.  Ongoing  encouragement to make sure to comprehend treatment recommendations and to stretch and hydrate adequately for back health. Bereavement -- Continue working  through estate tasks, if any remain, and reminders of Johnny [name]  Where needed, notice and forgive Johnny [name] passing on his watch.  Make free use of talking to her imaginally, if it will get things off his head or heart.  Where needed imagine her own reassuring that she is OK and nothing owed to her now.  Refresh promises of faith for himself, as he did for her in life.  Griefshare program available if he regains interest. Support system -- Maintain personal support with Johnny [name] and any available friends.   Marital adjustment -- Continue to emphasize unambiguous support for her amid health declines.  Work through [name], television/film/video how to live together constructively, watch for times when it may feel like dealing with ex, hold off jumping to defensive conclusions, and be willing to check perceptions under stress.  Prioritize allying on practical solutions for issues and refusing to react like rivals.  Use tips for respectfully message checking with Johnny [name] and asking her to consider, at discretion, how expressed negativity works. Johnny [name]'s care -- Continue to work with Medicaid, [name], and the Johnny [name] as appropriate.  Stay open to positive developments, ask before assuming the worse, and use calls and visits to foster more mature working relationship.  Look to future visits with him to practice coping with transitions and attachment to two homes. Family conflict -- Self-affirm good relationship with Johnny [name], continue retiring resentments with Johnny [name], focus on what he's building with Johnny [name] and welcoming the next generation.  S about allowing news to reach Johnny [name] and subdue worry. Other recommendations/advice -- As may be noted above.  Continue to utilize previously learned skills ad lib. Medication compliance -- Maintain medication as prescribed and work faithfully with relevant prescriber(s) if any changes are desired or seem indicated. Crisis service -- Aware of call list and work-in appts.  Call the clinic on-call service, 988/hotline,  911, or present to [facility] or ER if any life-threatening psychiatric crisis. Followup -- Return for time as already scheduled.  Next scheduled visit with me 01/25/2024.  Next scheduled in this office 01/25/2024.  Johnny Kendall, PhD Johnny Kendall, PhD LP Clinical Psychologist, [facility] [address] [place], [place] [address] [phone]

## 2024-01-21 DIAGNOSIS — M4316 Spondylolisthesis, lumbar region: Secondary | ICD-10-CM | POA: Diagnosis not present

## 2024-01-21 DIAGNOSIS — Z6837 Body mass index (BMI) 37.0-37.9, adult: Secondary | ICD-10-CM | POA: Diagnosis not present

## 2024-01-25 ENCOUNTER — Ambulatory Visit (INDEPENDENT_AMBULATORY_CARE_PROVIDER_SITE_OTHER): Admitting: Psychiatry

## 2024-01-25 DIAGNOSIS — F411 Generalized anxiety disorder: Secondary | ICD-10-CM | POA: Diagnosis not present

## 2024-01-25 DIAGNOSIS — G3184 Mild cognitive impairment, so stated: Secondary | ICD-10-CM | POA: Diagnosis not present

## 2024-01-25 DIAGNOSIS — M48061 Spinal stenosis, lumbar region without neurogenic claudication: Secondary | ICD-10-CM

## 2024-01-25 DIAGNOSIS — F3131 Bipolar disorder, current episode depressed, mild: Secondary | ICD-10-CM

## 2024-01-25 DIAGNOSIS — Z636 Dependent relative needing care at home: Secondary | ICD-10-CM | POA: Diagnosis not present
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
## 2024-02-19 ENCOUNTER — Ambulatory Visit (INDEPENDENT_AMBULATORY_CARE_PROVIDER_SITE_OTHER): Admitting: Psychiatry

## 2024-02-19 DIAGNOSIS — Z636 Dependent relative needing care at home: Secondary | ICD-10-CM

## 2024-02-19 DIAGNOSIS — F3131 Bipolar disorder, current episode depressed, mild: Secondary | ICD-10-CM

## 2024-02-19 DIAGNOSIS — F411 Generalized anxiety disorder: Secondary | ICD-10-CM | POA: Diagnosis not present

## 2024-02-19 DIAGNOSIS — G3184 Mild cognitive impairment, so stated: Secondary | ICD-10-CM | POA: Diagnosis not present

## 2024-02-19 DIAGNOSIS — M48061 Spinal stenosis, lumbar region without neurogenic claudication: Secondary | ICD-10-CM
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
## 2024-02-29 ENCOUNTER — Ambulatory Visit: Admitting: Psychiatry

## 2024-03-01 ENCOUNTER — Encounter: Payer: Self-pay | Admitting: Psychiatry

## 2024-03-01 ENCOUNTER — Ambulatory Visit: Admitting: Psychiatry

## 2024-03-01 DIAGNOSIS — F5105 Insomnia due to other mental disorder: Secondary | ICD-10-CM

## 2024-03-01 DIAGNOSIS — R7989 Other specified abnormal findings of blood chemistry: Secondary | ICD-10-CM

## 2024-03-01 DIAGNOSIS — F3162 Bipolar disorder, current episode mixed, moderate: Secondary | ICD-10-CM

## 2024-03-01 DIAGNOSIS — G4710 Hypersomnia, unspecified: Secondary | ICD-10-CM | POA: Diagnosis not present

## 2024-03-01 DIAGNOSIS — F3131 Bipolar disorder, current episode depressed, mild: Secondary | ICD-10-CM

## 2024-03-01 DIAGNOSIS — G4730 Sleep apnea, unspecified: Secondary | ICD-10-CM

## 2024-03-01 DIAGNOSIS — F411 Generalized anxiety disorder: Secondary | ICD-10-CM | POA: Diagnosis not present

## 2024-03-01 MED ORDER — QUETIAPINE FUMARATE 300 MG PO TABS: 300.0000 mg | ORAL_TABLET | Freq: Every day | ORAL | 1 refills | Status: DC

## 2024-03-01 MED ORDER — OXCARBAZEPINE 600 MG PO TABS: ORAL_TABLET | ORAL | 1 refills | Status: DC

## 2024-03-01 NOTE — Progress Notes (Signed)
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 none in 31 yrs(hx. ETOH abuse)   Drug use: Not Currently    Types: Cocaine, Heroin, Marijuana    Comment: No use in 27 yrs.   Sexual activity: Not Currently    Partners: Female  Other Topics Concern   Not on file  Social History Narrative   Patient lives at home with his wife Giles). Patient self employed.   Both handed.   Caffeine- One cup daily   Social Drivers of Corporate investment banker Strain: Not on file  Food Insecurity: Not on file  Transportation Needs: Not on file  Physical Activity: Not on file  Stress: Not on file  Social Connections: Not on file  Intimate Partner Violence: Not on file    Past Medical History, Surgical history, Social history, and Family history were reviewed and updated as appropriate.  Son should be in group home: taking Seroquel , Depakote and Zoloft  50.  Divorced. Still goes for exercise.   Latest sleep study negative for OSA but some years ago.  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  Please see review of systems for further details on the patient's review from today.   Objective:   Physical Exam:  There were no vitals taken for this visit.  Physical Exam Constitutional:      General: He is not in acute distress.    Appearance: He is well-developed. He is obese.  Musculoskeletal:        General: No deformity.  Neurological:     Mental Status: He is alert and oriented to person, place, and time.     Cranial  Nerves: No dysarthria.  Coordination: Coordination abnormal.     Comments: Using cane  Psychiatric:        Attention and Perception: Attention and perception normal. He does not perceive auditory or visual hallucinations.        Mood and Affect: Mood is anxious. Mood is not depressed. Affect is tearful. Affect is not labile or angry.        Speech: Speech normal.        Behavior: Behavior normal. Behavior is not agitated or slowed. Behavior is cooperative.        Thought Content: Thought content is not paranoid or delusional. Thought content does not include homicidal or suicidal ideation. Thought content does not include suicidal plan.        Cognition and Memory: Cognition and memory normal.        Judgment: Judgment normal.     Comments: insight fair. Anger under control  ongoing distress and anxious with stressors of wife's  health px.         Lab Review:     Component Value Date/Time   NA 133 (L) 07/31/2023 1337   K 4.6 07/31/2023 1337   CL 95 (L) 07/31/2023 1337   CO2 30 07/31/2023 1337   GLUCOSE 121 (H) 07/31/2023 1337   BUN 18 07/31/2023 1337   CREATININE 1.11 07/31/2023 1337   CALCIUM  9.2 07/31/2023 1337   PROT 6.4 03/18/2023 0945   ALBUMIN  4.4 06/26/2016 1521   AST 20 06/26/2016 1521   ALT 17 06/26/2016 1521   ALKPHOS 59 06/26/2016 1521   BILITOT 0.7 06/26/2016 1521   GFRNONAA >60 07/31/2023 1337   GFRAA >60 06/26/2016 1521       Component Value Date/Time   WBC 5.1 07/31/2023 1337   RBC 5.44 07/31/2023 1337   HGB 16.2 07/31/2023 1337   HCT 47.7 07/31/2023 1337   PLT 230 07/31/2023 1337   MCV 87.7 07/31/2023 1337   MCH 29.8 07/31/2023 1337   MCHC 34.0 07/31/2023 1337   RDW 12.9 07/31/2023 1337   LYMPHSABS 0.6 (L) 06/26/2016 1521   MONOABS 0.6 06/26/2016 1521   EOSABS 0.0 06/26/2016 1521   BASOSABS 0.0 06/26/2016 1521    No results found for: POCLITH, LITHIUM   No results found for: PHENYTOIN, PHENOBARB, VALPROATE, CBMZ   03/18/23 B12  low 215 [phone])  .res Assessment: Plan:    Bipolar affective disorder, currently depressed, mild (HCC)  Generalized anxiety disorder  Low vitamin B12 level  Sleep apnea with hypersomnolence  Insomnia due to mental condition   30 min face to face time with patient.  We discussed Chronic depression, anxiety, insomnia, stress is ongoing and worse lately. Has never achieved freedom from symptoms.  No significant anger outbursts currently.  No unusual mood swings. Chronic severe stress dealing with severely mentally ill violent son and recent death of Johnny [name]  But much better affect and mood with recent positive changes.  We discussed his high dosages and polypharmacy that are medically necessary.  Disc SE risks esp sedation. Anger is better.   Stress with exW is better..  We discussed the short-term risks associated with benzodiazepines including sedation and increased fall risk among others.  Discussed long-term side effect risk including dependence, potential withdrawal symptoms, and the potential eventual dose-related risk of dementia.  But recent studies from 2020 dispute this association between benzodiazepines and dementia risk. Newer studies in 2020 do not support an association with dementia.  Disc in detail concerns about tolerance which may be developing. No SE  Disc sleep hygiene and light interfering with sleep.    Discussed potential metabolic side effects associated with atypical antipsychotics, as well as potential risk for movement side effects. Advised pt to contact office if movement side effects occur.  Disc risk sedation with current med regimen.  Consider Auvelity for chronic depression with different mechanism.  Defer a bit longer.  Discussed the polypharmacy which is not ideal but necessary.  He is taking .  DT stress can't go down further with meds.  Disc SE.  Lorazepam  doesn't make him sleepy  Change insurance to [employer] mail service except lorazepam . Lamotrigine   100 TID and 200 HS for bipolar depression,  lorazepam  for anxiety 1 mg TID  oxcarbazepine  300 mg tablets 2 every morning and 4 nightly for bipolar disorder, switch to 600 mg tabs for cost clonidine  0.2 mg BID for  off label for anxiety and mixed bipolar sx and should also help htn noted.,   SE hangover in AM still.  Willing to retry lower dose Seroquel  again.  Reduce Seroquel  300 mg  to see if hangover better.  Disc risk relapse mood swings or dep.  Not taking B12: Needs to start B12 for low level 215.  This might help energy, strength , and cognition.   Hx low sodium too in 2017 07/31/23 sodium 133 Disc importance  of B12  Disc ED and meds for it.  Disc GoodRX.  Sildenafil  100 tolerated prn.  FU 2 mos for support  Lorene Macintosh MD, DFAPA  Please see After Visit Summary for patient specific instructions.  Future Appointments  Date Time Provider Department Center  03/14/2024  4:00 PM Marijean Charleston, PhD [facility] None  03/28/2024  4:00 PM Marijean Charleston, PhD [facility] None  04/11/2024  4:00 PM Marijean Charleston, PhD [facility] None  04/25/2024  4:00 PM Marijean Charleston, PhD [facility] None  05/09/2024  4:00 PM Marijean Charleston, PhD [facility] None    No orders of the defined types were placed in this encounter.      -------------------------------

## 2024-03-14 ENCOUNTER — Ambulatory Visit (INDEPENDENT_AMBULATORY_CARE_PROVIDER_SITE_OTHER): Admitting: Psychiatry

## 2024-03-14 DIAGNOSIS — F411 Generalized anxiety disorder: Secondary | ICD-10-CM

## 2024-03-14 DIAGNOSIS — F3131 Bipolar disorder, current episode depressed, mild: Secondary | ICD-10-CM | POA: Diagnosis not present

## 2024-03-14 DIAGNOSIS — Z636 Dependent relative needing care at home: Secondary | ICD-10-CM | POA: Diagnosis not present

## 2024-03-14 DIAGNOSIS — F5105 Insomnia due to other mental disorder: Secondary | ICD-10-CM

## 2024-03-14 DIAGNOSIS — G3184 Mild cognitive impairment, so stated: Secondary | ICD-10-CM | POA: Diagnosis not present

## 2024-03-14 NOTE — Progress Notes (Unsigned)
 Psychotherapy Progress Note [facility] [name], PhD LP  Patient ID: Johnny [name])    MRN: [id number] Therapy format: Individual psychotherapy Date: 03/14/2024      Start: 4:07p     Stop: 5:07p     Time Spent: 60 min Location: In-person   Session narrative (presenting needs, interim history, self-report of stressors and symptoms, applications of prior therapy, status changes, and interventions made in session) Johnny [name] is now chronically unwell, conking out a lot.  Johnny [name] is helpful 2 days/wk, while Johnny [name] handles most of the household chores.  She's continued to put in applications, though it continues to sound unlikely that she could sustain one.  Johnny [name] is having to shadow her medicine now.  She has a pillbox but does not use it.  He really doesn't know what they should be yet, either, but leery of lobbying her  On the upside, Johnny [name] has initiated therapy with Johnny [name] here, and will start med management with Johnny Pizza, NP, this Wed.    Johnny [name] called twice last night, unable to leave messages d/t full VM.  Uncertain how to handle him requesting to come up for a weekend.  Aware his mother Johnny [name] rarely calls him.  Divorce-aversary 8/27, 7 yrs now, on M's birthday.  Still makes him feel sad, largely about being rejected the way he was, but also the pain and expense of 8 years of separation and legal responses to repetitious threats of expensing.  Also at this point, missing M after 2 birthdays not with us .    Therapeutic modalities: {AM:23362::Cognitive Behavioral Therapy,Solution-Oriented/Positive Psychology}  Mental Status/Observations:  Appearance:   {PSY:22683}     Behavior:  {PSY:21022743}  Motor:  {PSY:22302}  Speech/Language:   {PSY:22685}  Affect:  {PSY:22687}  Mood:  {PSY:31886}  Thought process:  {PSY:31888}  Thought content:    {[phone]}  Sensory/Perceptual disturbances:    {PSY:[phone]}  Orientation:  {Psych  Orientation:23301::Fully oriented}  Attention:  {Good-Fair-Poor ratings:23770::Good}    Concentration:  {Good-Fair-Poor ratings:23770::Good}  Memory:  {PSY:[phone]}  Insight:    {Good-Fair-Poor ratings:23770::Good}  Judgment:   {Good-Fair-Poor ratings:23770::Good}  Impulse Control:  {Good-Fair-Poor ratings:23770::Good}   Risk Assessment: Danger to Self: {Risk:22599::No} Self-injurious Behavior: {Risk:22599::No} Danger to Others: {Risk:22599::No} Physical Aggression / Violence: {Risk:22599::No} Duty to Warn: {AMYesNo:22526::No} Access to Firearms a concern: {AMYesNo:22526::No}  Assessment of progress:  {Progress:22147::progressing}  Diagnosis:   ICD-10-CM   1. Bipolar affective disorder, currently depressed, mild (HCC)  F31.31     2. Generalized anxiety disorder  F41.1     3. Caregiver stress  Z63.6     4. Mild cognitive impairment  G31.84     5. Insomnia due to mental condition  F51.05      Plan:  *** Other recommendations/advice -- As may be noted above.  Continue to utilize previously learned skills ad lib. Medication compliance -- Maintain medication as prescribed and work faithfully with relevant prescriber(s) if any changes are desired or seem indicated. Crisis service -- Aware of call list and work-in appts.  Call the clinic on-call service, 988/hotline, 911, or present to [facility] or ER if any life-threatening psychiatric crisis. Followup -- No follow-ups on file.  Next scheduled visit with me 03/28/2024.  Next scheduled in this office 03/28/2024.  Lamar Kendall, PhD [name], PhD LP Clinical Psychologist, [facility] [address], [address] [place], [place] [address] [phone]

## 2024-03-28 ENCOUNTER — Ambulatory Visit (INDEPENDENT_AMBULATORY_CARE_PROVIDER_SITE_OTHER): Admitting: Psychiatry

## 2024-03-28 DIAGNOSIS — Z636 Dependent relative needing care at home: Secondary | ICD-10-CM | POA: Diagnosis not present

## 2024-03-28 DIAGNOSIS — G3184 Mild cognitive impairment, so stated: Secondary | ICD-10-CM

## 2024-03-28 DIAGNOSIS — F5104 Psychophysiologic insomnia: Secondary | ICD-10-CM

## 2024-03-28 DIAGNOSIS — F3131 Bipolar disorder, current episode depressed, mild: Secondary | ICD-10-CM

## 2024-03-28 DIAGNOSIS — F411 Generalized anxiety disorder: Secondary | ICD-10-CM

## 2024-03-28 DIAGNOSIS — M48061 Spinal stenosis, lumbar region without neurogenic claudication: Secondary | ICD-10-CM

## 2024-03-28 NOTE — Progress Notes (Unsigned)
 Psychotherapy Progress Note [facility] [name], PhD LP  Patient ID: Johnny [name])    MRN: [id number] Therapy format: Individual psychotherapy Date: 03/28/2024      Start: 4:14p     Stop: 5:04p     Time Spent: 50 min Location: In-person   Session narrative (presenting needs, interim history, self-report of stressors and symptoms, applications of prior therapy, status changes, and interventions made in session) Been using light alarm and phone display settings to help manage circadian signalling issues.  Johnny [name] has taken to sleeping on the couch due to pain and restlessness.  Adjustable bed is not reaching what she needs yet.  Johnny [name] is getting good relief from Salonpas patches.  Friday had an extra long sleep, midnight to 4:15pm.  Best interpretation, he was just that fatigued and needed it.  Has been using up a lot of energy, and some ups and downs at night, caring for Johnny [name], for months now.  Has her set up to see psychiatry, she continues in therapy with a colleague, and both remain hopeful she can bounce back and land a new job, even though her liver function is just 23%.    Dreamt recently of mother, younger.  Interpreted as wish fulfillment dream, connecting and seeing her in good health.  Has yet to take her ashes to the beach, currently stored in a closet.  Normalized, validated that she's not in the ashes, it's just a good errand to run when circumstances truly allow.    Discussed Johnny [name]'s care further -- turns out she is between GIs right now and does not have a hepatologist.  Says he doesn't have much time to help work that through, and apparently Johnny [name] is not taking the initiative, either, being too sick, depressed, unfamiliar with this area, or all three.  Confronted unrealistic thoughts of doctors she has figuring it out, but PCP is relatively new and potentially not yet caught up with her needs.  Looked up possibilities in the area within the [facility] network,  and strongly encouraged to inquire with PCP about establishing a GI who specifically can handle her liver disease management.  Her health, and maybe survival, could depend on getting appropriate care.    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  Assured he will get some time back after he conquers the problem of not having a specialist on board.  Assured asking for a referral is a 10-minute errand, either on the phone or through the patient portal, if one or both of them will just start, and not let their feelings kick the can down the road longer.  Therapeutic modalities: Cognitive Behavioral Therapy, Solution-Oriented/Positive Psychology, and Ego-Supportive  Mental Status/Observations:  Appearance:   Casual     Behavior:  Appropriate  Motor:  Cane, more flexible with back  Speech/Language:   Clear and Coherent  Affect:  Appropriate  Mood:  dysthymic  Thought process:  normal  Thought content:    WNL  Sensory/Perceptual disturbances:    WNL  Orientation:  Fully oriented  Attention:  Good    Concentration:  Good  Memory:  WNL  Insight:    Fair  Judgment:   Variable  Impulse Control:  Good   Risk Assessment: Danger to Self: No Self-injurious Behavior: No Danger to Others: No Physical Aggression / Violence: No Duty to Warn: No Access to Firearms a concern: No  Assessment of progress:  progressing  Diagnosis:   ICD-10-CM  1. Bipolar affective disorder, currently depressed, mild (HCC)  F31.31     2. Generalized anxiety disorder  F41.1     3. Caregiver stress  Z63.6     4. Psychophysiological insomnia  F51.04     5. Spinal stenosis of lumbar region, unspecified whether neurogenic claudication present  M48.061     6. Mild cognitive impairment  G31.84      Plan:  Johnny [name]'s care -- Prioritize getting referral to a GI and/or hepatologist, in order to keep from squandering opportunity to treat her liver condition, and stand the  best chance of regaining employable status and quality of life. Marital adjustment -- Continue to emphasize unambiguous support for Johnny [name] amid serious and severe health challenges.  Work through [name], television/film/video how to live together constructively, mindful that each of them can at any time be reacting to emotional learning with their exes, and often the task is to just be better than who the other had.  Watch for times when it may feel like dealing with ex, hold off jumping to defensive conclusions, and be willing to check perceptions under stress before reacting with anger, especially if treated angrily.  Prioritize allying about practical solutions to issues and refusing to react like rivals.  Use tips for respectfully message checking with [name] and asking her to consider, at his discretion, how her expressed negativity tends to work.  [name]'s care -- Continue to work with Medicaid, [name], and [name] as appropriate.  Stay open to positive developments, ask before assuming the worse, and use calls and visits to foster more mature working relationship.  Look to future visits with him to practice coping with transitions and attachment to two homes. Family conflict -- Self-affirm good relationship with [name], continue retiring resentments with [name], focus on what he's building with [name] and welcoming the next generation.  S about allowing news to reach [name] and subdue worry. Chronic ortho/neuro issues -- Ensure adequate understanding of medical recommendations and discussion if concerned, before deciding against recommended assessment and treatment, don't assume self-defeating or doubtful things.  Ongoing  encouragement to make sure to comprehend treatment recommendations and to stretch and hydrate adequately for back health. Bereavement -- Continue working through estate tasks, if any remain, and reminders of M.  Where needed, notice and forgive [name].  Make free use of talking to her  imaginally, if it will get things off his head or heart.  Where needed imagine her own reassurance that she is OK now, nothing owed to her now.  Refresh promises of faith for himself, as he did for her in life.  Griefshare program available if interest and time. Support system -- Maintain personal support with [name] and any available friends.   Other recommendations/advice -- As may be noted above.  Continue to utilize previously learned skills ad lib. Medication compliance -- Maintain medication as prescribed and work faithfully with relevant prescriber(s) if any changes are desired or seem indicated. Crisis service -- Aware of call list and work-in appts.  Call the clinic on-call service, 988/hotline, 911, or present to [facility] or ER if any life-threatening psychiatric crisis. Followup -- Return for time as already scheduled.  Next scheduled visit with me 04/11/2024.  Next scheduled in this office 04/11/2024.  Lamar Kendall, PhD [name], PhD LP Clinical Psychologist, [facility] [address] [place], [place] [address] [phone]

## 2024-03-31 DIAGNOSIS — Z23 Encounter for immunization: Secondary | ICD-10-CM | POA: Diagnosis not present

## 2024-03-31 DIAGNOSIS — Z125 Encounter for screening for malignant neoplasm of prostate: Secondary | ICD-10-CM | POA: Diagnosis not present

## 2024-03-31 DIAGNOSIS — Z79899 Other long term (current) drug therapy: Secondary | ICD-10-CM | POA: Diagnosis not present

## 2024-03-31 DIAGNOSIS — Z1212 Encounter for screening for malignant neoplasm of rectum: Secondary | ICD-10-CM | POA: Diagnosis not present

## 2024-03-31 DIAGNOSIS — R030 Elevated blood-pressure reading, without diagnosis of hypertension: Secondary | ICD-10-CM | POA: Diagnosis not present

## 2024-03-31 DIAGNOSIS — F3162 Bipolar disorder, current episode mixed, moderate: Secondary | ICD-10-CM | POA: Diagnosis not present

## 2024-03-31 DIAGNOSIS — Z131 Encounter for screening for diabetes mellitus: Secondary | ICD-10-CM | POA: Diagnosis not present

## 2024-03-31 DIAGNOSIS — Z13220 Encounter for screening for lipoid disorders: Secondary | ICD-10-CM | POA: Diagnosis not present

## 2024-03-31 DIAGNOSIS — Z1211 Encounter for screening for malignant neoplasm of colon: Secondary | ICD-10-CM | POA: Diagnosis not present

## 2024-03-31 DIAGNOSIS — R0982 Postnasal drip: Secondary | ICD-10-CM | POA: Diagnosis not present

## 2024-04-01 DIAGNOSIS — Z79899 Other long term (current) drug therapy: Secondary | ICD-10-CM | POA: Diagnosis not present

## 2024-04-01 DIAGNOSIS — Z125 Encounter for screening for malignant neoplasm of prostate: Secondary | ICD-10-CM | POA: Diagnosis not present

## 2024-04-01 DIAGNOSIS — Z13220 Encounter for screening for lipoid disorders: Secondary | ICD-10-CM | POA: Diagnosis not present

## 2024-04-01 DIAGNOSIS — Z131 Encounter for screening for diabetes mellitus: Secondary | ICD-10-CM | POA: Diagnosis not present

## 2024-04-01 DIAGNOSIS — R030 Elevated blood-pressure reading, without diagnosis of hypertension: Secondary | ICD-10-CM | POA: Diagnosis not present

## 2024-04-01 DIAGNOSIS — Z1212 Encounter for screening for malignant neoplasm of rectum: Secondary | ICD-10-CM | POA: Diagnosis not present

## 2024-04-01 DIAGNOSIS — Z1211 Encounter for screening for malignant neoplasm of colon: Secondary | ICD-10-CM | POA: Diagnosis not present

## 2024-04-11 ENCOUNTER — Ambulatory Visit (INDEPENDENT_AMBULATORY_CARE_PROVIDER_SITE_OTHER): Admitting: Psychiatry

## 2024-04-11 DIAGNOSIS — F3131 Bipolar disorder, current episode depressed, mild: Secondary | ICD-10-CM | POA: Diagnosis not present

## 2024-04-11 DIAGNOSIS — M48061 Spinal stenosis, lumbar region without neurogenic claudication: Secondary | ICD-10-CM

## 2024-04-11 DIAGNOSIS — F5104 Psychophysiologic insomnia: Secondary | ICD-10-CM

## 2024-04-11 DIAGNOSIS — Z636 Dependent relative needing care at home: Secondary | ICD-10-CM | POA: Diagnosis not present

## 2024-04-11 DIAGNOSIS — F411 Generalized anxiety disorder: Secondary | ICD-10-CM

## 2024-04-11 NOTE — Progress Notes (Unsigned)
 Psychotherapy Progress Note [facility] [name], PhD LP  Patient ID: CHAI ROUTH)    MRN: [id number] Therapy format: Individual psychotherapy Date: 04/11/2024      Start: 4:05p     Stop: 4:55p     Time Spent: 50 min Location: In-person   Session narrative (presenting needs, interim history, self-report of stressors and symptoms, applications of prior therapy, status changes, and interventions made in session) Johnny [name] saw Redell Pizza, NP here, got on Cymbalta and Seroquel  and seeing a beneficial difference.  She's mellowing out and falling asleep well.  She has been to back surgeon, who recommends injection next week.  Says she gets irritated about him talking with Ray about visiting home.  She has landed 2 job interviews in person.  She also has SSD paperwork not filled out, seemingly stuck on the fence.  Says she has recently declared she wants to come in with him here, suggestion she is either suspicious or wanting to direct some kind of change in him.  Agreed again she is welcome, and the opportunity to hear out her concerns could help plenty, especially if she has been harboring false ideas.   Recent heart to heart helpful, probably.  She's just so sick, enough of the time, and sick of it, on top of trying to learn how to be married again, and unemployed, and displaced, after a prior betrayal.  Continued to encourage patience and customer service approach willing to ask her what she needs and willing to let her temporarily distort things in pain.  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  Still c/o lots of distractions, including doctor visits for Johnny [name]  Happy to report his own colonoscopy can wait till next year (not yet 10 yrs, wouldn't be covered).  Therapeutic modalities: Cognitive Behavioral Therapy, Solution-Oriented/Positive Psychology, Ego-Supportive, and Insight-Oriented  Mental Status/Observations:  Appearance:   Casual     Behavior:   Appropriate  Motor:  Normal and exc cane  Speech/Language:   Clear and Coherent  Affect:  Appropriate  Mood:  dysthymic and more stoic  Thought process:  normal  Thought content:    WNL  Sensory/Perceptual disturbances:    WNL  Orientation:  Fully oriented  Attention:  Good    Concentration:  Good  Memory:  grossly intact  Insight:    Fair  Judgment:   Good  Impulse Control:  Good   Risk Assessment: Danger to Self: No Self-injurious Behavior: No Danger to Others: No Physical Aggression / Violence: No Duty to Warn: No Access to Firearms a concern: No  Assessment of progress:  progressing  Diagnosis:   ICD-10-CM   1. Bipolar affective disorder, currently depressed, mild (HCC)  F31.31     2. Generalized anxiety disorder  F41.1     3. Caregiver stress  Z63.6     4. Psychophysiological insomnia  F51.04     5. Spinal stenosis of lumbar region, unspecified whether neurogenic claudication present  M48.061      Plan:  Johnny [name]'s care -- Prioritize getting referral to a GI and/or hepatologist, in order to keep from squandering opportunity to treat her liver condition, and stand the best chance of regaining employable status and quality of life. Marital adjustment -- Continue to emphasize unambiguous support for Johnny [name] amid serious and severe health challenges.  Work through [name], television/film/video how to live together constructively, mindful that each of them can at any time be reacting to emotional learning with their exes, and often the task  is to just be better than who the other had.  Watch for times when it may feel like dealing with ex, hold off jumping to defensive conclusions, and be willing to check perceptions under stress before reacting with anger, especially if treated angrily.  Prioritize allying about practical solutions to issues and refusing to react like rivals.  Use tips for respectfully message checking with [name] and asking her to consider, at his discretion, how her expressed  negativity tends to work out to get what she wants.  Welcome to join therapy ad lib. [name]'s care -- Continue to work with Medicaid, [name], and [name] as appropriate.  Stay open to positive developments, ask before assuming the worse, and use calls and visits to foster more mature working relationship.  Look to future visits with him to practice coping with transitions and attachment to two homes. Family conflict -- Self-affirm good relationship with [name], continue retiring resentments with [name], focus on what he's building with [name] and welcoming the next generation.  S about allowing news to reach [name] and subdue worry. Chronic ortho/neuro issues -- Ensure adequate understanding of medical recommendations and discussion if concerned, before deciding against recommended assessment and treatment, don't assume self-defeating or doubtful things.  Ongoing  encouragement to make sure to comprehend treatment recommendations and to stretch and hydrate adequately for back health. Bereavement -- Continue working through estate tasks, if any remain, and reminders of M.  Where needed, notice and forgive [name].  Make free use of talking to her imaginally, if it will get things off his head or heart.  Where needed imagine her own reassurance that she is OK now, nothing owed to her now.  Refresh promises of faith for himself, as he did for her in life.  Griefshare program available if interest and time. Support system -- Maintain personal support with [name] and any available friends.   Other recommendations/advice -- As may be noted above.  Continue to utilize previously learned skills ad lib. Medication compliance -- Maintain medication as prescribed and work faithfully with relevant prescriber(s) if any changes are desired or seem indicated. Crisis service -- Aware of call list and work-in appts.  Call the clinic on-call service, 988/hotline, 911, or present to [facility] or ER if any  life-threatening psychiatric crisis. Followup -- No follow-ups on file.  Next scheduled visit with me 04/25/2024.  Next scheduled in this office 04/25/2024.  Lamar Kendall, PhD [name], PhD LP Clinical Psychologist, [facility] [address] [place], [place] [address] [phone]

## 2024-04-25 ENCOUNTER — Ambulatory Visit (INDEPENDENT_AMBULATORY_CARE_PROVIDER_SITE_OTHER): Admitting: Psychiatry

## 2024-04-25 DIAGNOSIS — F411 Generalized anxiety disorder: Secondary | ICD-10-CM | POA: Diagnosis not present

## 2024-04-25 DIAGNOSIS — F5104 Psychophysiologic insomnia: Secondary | ICD-10-CM

## 2024-04-25 DIAGNOSIS — Z636 Dependent relative needing care at home: Secondary | ICD-10-CM

## 2024-04-25 DIAGNOSIS — F3131 Bipolar disorder, current episode depressed, mild: Secondary | ICD-10-CM

## 2024-04-25 DIAGNOSIS — M48061 Spinal stenosis, lumbar region without neurogenic claudication: Secondary | ICD-10-CM

## 2024-04-25 DIAGNOSIS — G3184 Mild cognitive impairment, so stated: Secondary | ICD-10-CM

## 2024-04-25 NOTE — Progress Notes (Unsigned)
 Psychotherapy Progress Note [facility] [name], PhD LP  Patient ID: Johnny [name])    MRN: [id number] Therapy format: Individual psychotherapy Date: 04/25/2024      Start: 4:19p     Stop: 5:06p     Time Spent: 47 min Location: In-person   Session narrative (presenting needs, interim history, self-report of stressors and symptoms, applications of prior therapy, status changes, and interventions made in session) Johnny [name] continues unable to land jobs, despite a couple interviews in person.  One to find out yet, which would entail either commuting or moving (deeply unwanted) to [place].  She is not compliant with her CPAP, or her diabetes, or her liver disease, from what Johnny [name] can tell.  She is on Ozempic, losing weight effectively, nearing 20# off, and possibly doing better with back pain because of it.  Bothered by mention of moving to [place] if she gets the job, but ultimately willing.  Been doing the lion's share of domestic labor, voluntarily, because she's tired and he wants to help prove he's not like her ex.  Affirmed and encouraged giving that way, and encouraged be ready to find that she may get noticed as ill and turned down even without the taboo questions being asked.  It still sounds overwhelmingly like she needs to establish followup with a hepatologist in order to best support her ailing liver and get on the list for transplant.  Framed an affirming way to approach her about that, rooted in expressing his own worry for her and need to know Johnny [name] is looking out for my girlfriend.  Personally, trying to sleep better.  Seroquel  300 and 4 Trileptal  works very well.  Johnny [name] may get up, pretty often, not sure why, could be the cat, could be pain, and finishing the night in another room.  Encouraged to respectfully investigate with her if he wants, but certainly maintain sleep amount and quality, and if he suspects apnea of his own to assess and provide for  that.  Continuing run of pleasant phone calls with Johnny [name] for several weeks now, no new crises or hospital trips.  Oddly, last case management meeting nobody else showed up, which helped get plenty of direct conversation with his manager.  Appreciates no irritations from officials who don't know Johnny [name]'s case by bog down the time, or having to listen to Johnny [name] chime in unnecessarily, and the chance to get things across about how Johnny [name] works and be valued more as a knowledgeable parent.  Affirmed and encouraged continuing to work with the best parts of the system.  Dear Johnny [name] Meth in [place]  hospitalized for pneumonia, discharged, then rehospitalized for relapse, turned up COVID.  Support/validation provided.   Therapeutic modalities: Cognitive Behavioral Therapy, Solution-Oriented/Positive Psychology, and Ego-Supportive  Mental Status/Observations:  Appearance:   Casual     Behavior:  Appropriate  Motor:  cane, for steadiness; balance issue on rising  Speech/Language:   Clear and Coherent  Affect:  Appropriate  Mood:  Concerned, not overly anxious nor frankly depressed  Thought process:  normal  Thought content:    WNL  Sensory/Perceptual disturbances:    WNL  Orientation:  Fully oriented  Attention:  Good    Concentration:  Good  Memory:  grossly intact  Insight:    Good  Judgment:   Generally sound, could use more foresight sometimes  Impulse Control:  Good   Risk Assessment: Danger to Self: No Self-injurious Behavior: No Danger to Others: No Physical Aggression / Violence: No  Duty to Warn: No Access to Firearms a concern: No  Assessment of progress:  progressing  Diagnosis:   ICD-10-CM   1. Bipolar affective disorder, currently depressed, mild (HCC)  F31.31     2. Generalized anxiety disorder  F41.1     3. Caregiver stress  Z63.6     4. Psychophysiological insomnia  F51.04     5. Spinal stenosis of lumbar region, unspecified whether neurogenic claudication present  M48.061      6. Mild cognitive impairment  G31.84      Plan:  Johnny [name]'s care -- Prioritize recruiting her to reconnect with a GI and/or hepatologist, to keep from squandering her opportunity to treat her liver condition, and stand the best chance of regaining employable status and quality of life.  Understood about tendency to defend herself against meddling or being managed, frame his concerns in terms of wanting to know his girlfriend is getting what she actually needs from management. Marital adjustment -- Continue to emphasize unambiguous support for Johnny [name] amid serious and severe health challenges.  Work through [name], television/film/video how to live together constructively, mindful that each of them can at any time be reacting to emotional learning with their exes, and often the task is to just be better than who the other had.  Watch for times when it may feel like dealing with ex, hold off jumping to defensive conclusions, and be willing to check perceptions under stress before reacting with anger, especially if treated angrily.  Prioritize allying about practical solutions to issues and refusing to react like rivals.  Use tips for respectfully message checking with Johnny [name] and asking her to consider, at his discretion, how her expressed negativity tends to work out to get what she wants.  Welcome to join therapy ad lib. Johnny [name]'s care -- Continue to work with Medicaid, [name], and [name] as appropriate.  Stay open to positive developments, ask before assuming the worse, and use calls and visits to foster more mature working relationship.  Look to future visits with him to practice coping with transitions and attachment to two homes. Family conflict -- Self-affirm good relationship with Johnny [name], continue retiring resentments with Johnny [name], focus on what he's building with Johnny [name] and welcoming the next generation.  S about allowing news to reach Johnny [name] and subdue worry. Chronic health conditions -- Ensure adequate understanding of  medical recommendations and discussion if concerned, before deciding against recommended assessment and treatment.  Don't assume self-defeating or doubtful things.  Ongoing encouragement to stretch, walk, and hydrate adequately for back health.  If suspected, reassess sleep apnea and his own CPAP therapy, as reduced sleep quality remains a potential cause of fatigue and cognitive dulling. Bereavement, PRN -- Continue working through estate tasks, if any remain, and reminders of M.  Where needed, notice and forgive [name].  Make free use of talking to her imaginally, if it will get things off his head or heart.  Where needed imagine her own reassurance that she is OK now, nothing owed to her now.  Refresh promises of faith for himself, as he did for her in life.  Griefshare program available if interest and time. Support system -- Maintain personal support with Johnny [name] and any available friends.   Other recommendations/advice -- As may be noted above.  Continue to utilize previously learned skills ad lib. Medication compliance -- Maintain medication as prescribed and work faithfully with relevant prescriber(s) if any changes are desired or seem indicated. Crisis service -- Aware  of call list and work-in appts.  Call the clinic on-call service, 988/hotline, 911, or present to [facility] or ER if any life-threatening psychiatric crisis. Followup -- Return for time as already scheduled.  Next scheduled visit with me 05/09/2024.  Next scheduled in this office 05/09/2024.  Lamar Kendall, PhD [name], PhD LP Clinical Psychologist, [facility] [address] [place], [place] [address] [phone]

## 2024-04-26 DIAGNOSIS — Z6837 Body mass index (BMI) 37.0-37.9, adult: Secondary | ICD-10-CM | POA: Diagnosis not present

## 2024-04-26 DIAGNOSIS — M4316 Spondylolisthesis, lumbar region: Secondary | ICD-10-CM | POA: Diagnosis not present

## 2024-05-04 DIAGNOSIS — K080 Exfoliation of teeth due to systemic causes: Secondary | ICD-10-CM | POA: Diagnosis not present

## 2024-05-09 ENCOUNTER — Ambulatory Visit (INDEPENDENT_AMBULATORY_CARE_PROVIDER_SITE_OTHER): Admitting: Psychiatry

## 2024-05-09 DIAGNOSIS — F3131 Bipolar disorder, current episode depressed, mild: Secondary | ICD-10-CM | POA: Diagnosis not present

## 2024-05-09 DIAGNOSIS — Z630 Problems in relationship with spouse or partner: Secondary | ICD-10-CM

## 2024-05-09 DIAGNOSIS — F411 Generalized anxiety disorder: Secondary | ICD-10-CM | POA: Diagnosis not present

## 2024-05-09 NOTE — Progress Notes (Unsigned)
 Psychotherapy Progress Note [facility] [name], PhD LP  Patient ID: JURELL BASISTA)    MRN: [id number] Therapy format: Individual psychotherapy Date: 05/09/2024      Start: 4:07p     Stop: 5:16p     Time Spent: 69 min Location: In-person   Session narrative (presenting needs, interim history, self-report of stressors and symptoms, applications of prior therapy, status changes, and interventions made in session) Cane free today, getting more flexible, strong, and balanced.  [name] had 2 in-person interviews, one locally at [employer], one with a 4-person panel in [place], turned down for both after feeling she had good showings.  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  Affirmed and encouraged, as it sounds like appropriate flexibility given the various needs they have.  [name] continues to lose weight, about 30# now, looking better and better to [place].  Her birthday last week, 57th.  Continued to encourage she be sure she is not just addressing weight loss but her known liver disease as well.  Says he will try to bring her attention to it, constructively.  Ezra's 1st birthday is coming up next Thursday 11/13, in [place].  Dismayed to find [employer], but his ex) is invited.  Bothers him a lot that he could run into her at all, plus he says [name] remains uneasy about her, almost a if she is competition or some other imagined threat.  Agreed it is well-established by now, and should be obvious, that she is neither seeking, nor would [name] agree to, a marital reunion with her, even if history is intimate enough to make children together.  Encouraged if needed to confirm with [name] that she knows there's nothing at stake, and to confirm with himself that he's strong enough now, actually, to cope with an irritating  personality, refocus on the occasion and the relationships he means to hold dear, and stay clear with himself there's nothing left to fight about, the divorce settled it, and anything else is not actually his burden.  Seeing the edginess between him and [name] subside these days, giving credit to his own efforts to modulate his voice and affect, which, after all, can be a tougher task with his size, his deep and resonant voice, times of honest frustration able to make their way into his voice easily, and [name]'s history of being intimidated.  Has been making his own effort to add smiling facial expressions and laughter to things.  Feels he has achieved the safety and understanding to inquire after her if something might be bothering her or she wants to talk about but is suppressing.  Affirmed and encouraged in being both proactive and kind, and owning frustrations when present, to help [name] put away any wondering whether she'll have to have something worse revealed later.    Therapeutic modalities: Cognitive Behavioral Therapy, Solution-Oriented/Positive Psychology, and Ego-Supportive  Mental Status/Observations:  Appearance:   Casual     Behavior:  Appropriate  Motor:  Normal and back pain gait, but w/o cane  Speech/Language:   Clear and Coherent  Affect:  Appropriate  Mood:  normal and less dysthymic, more even, primarily concerned for income situation  Thought process:  normal  Thought content:    WNL  Sensory/Perceptual disturbances:    WNL  Orientation:  Fully oriented  Attention:  Good    Concentration:  Good  Memory:  WNL  Insight:    Good  Judgment:   Good  Impulse Control:  Good   Risk Assessment: Danger to Self: No Self-injurious Behavior: No Danger to Others: No Physical Aggression / Violence: No Duty to Warn: No Access to Firearms a concern: No  Assessment of progress:  progressing  Diagnosis:   ICD-10-CM   1. Bipolar affective disorder, currently depressed, mild (HCC)   F31.31     2. Generalized anxiety disorder  F41.1     3. Relationship problem between partners  Z63.0      Plan:  [name]'s care -- Prioritize recruiting her to reconnect with a GI and/or hepatologist, to keep from squandering her opportunity to treat her liver condition, and stand the best chance of regaining employable status and quality of life.  Understood about tendency to defend herself against meddling or being managed, frame his concerns in terms of wanting to know his girlfriend is getting what she actually needs from management.  Defer to [facility] about whether she can work in her current state, and endorse her flexibility about which career to engage. Marital adjustment -- Continue to emphasize unambiguous support for [name] amid serious and severe health challenges.  Work through producer, television/film/video how to live together constructively, mindful that each of them can at any time be reacting to emotional learning with their exes, and often the task is to just be better than who the other had.  Watch for times when it may feel like dealing with ex, hold off jumping to defensive conclusions, and be willing to check perceptions under stress before reacting with anger, especially if treated angrily.  Prioritize allying about practical solutions to issues and refusing to react like rivals.  Use tips for respectfully message checking with [name] and asking her to consider, at his discretion, how her expressed negativity tends to work out to get what she wants.  Welcome to join therapy ad lib. [name]'s care -- Continue to work with Medicaid, [name], and [name] as appropriate.  Stay open to positive developments, ask before assuming the worse, and use calls and visits to foster more mature working relationship.  Look to future visits with him to practice coping with transitions and attachment to two homes. Family conflict -- Self-affirm good relationship with [name], continue retiring resentments with [name],  focus on what he's building with [name] and welcoming the next generation.  S about allowing news to reach [name] and subdue worry. Chronic health conditions -- Ensure adequate understanding of medical recommendations and discussion if concerned, before deciding against recommended assessment and treatment.  Don't assume self-defeating or doubtful things.  Ongoing encouragement to stretch, walk, and hydrate adequately for back health.  If suspected, reassess sleep apnea and his own CPAP therapy, as reduced sleep quality remains a potential cause of fatigue and cognitive dulling. Bereavement, PRN -- Continue working through estate tasks, if any remain, and reminders of M.  Where needed, notice and forgive [name].  Make free use of talking to her imaginally, if it will get things off his head or heart.  Where needed imagine her own reassurance that she is OK now, nothing owed to her now.  Refresh promises of faith for himself, as he did for her in life.  Griefshare program available if interest and time. Support system -- Maintain personal support with [name] and any available friends.   Other recommendations/advice -- As may be noted  above.  Continue to utilize previously learned skills ad lib. Medication compliance -- Maintain medication as prescribed and work faithfully with relevant prescriber(s) if any changes are desired or seem indicated. Crisis service -- Aware of call list and work-in appts.  Call the clinic on-call service, 988/hotline, 911, or present to [facility] or ER if any life-threatening psychiatric crisis. Followup -- Return for time as available.  Next scheduled visit with me Visit date not found.  Next scheduled in this office 06/01/2024.  Lamar Kendall, PhD [name], PhD LP Clinical Psychologist, [facility] [address] [place], [place] [address] [phone]

## 2024-05-24 DIAGNOSIS — K080 Exfoliation of teeth due to systemic causes: Secondary | ICD-10-CM | POA: Diagnosis not present

## 2024-05-26 DIAGNOSIS — Z59869 Financial insecurity, unspecified: Secondary | ICD-10-CM | POA: Diagnosis not present

## 2024-05-26 DIAGNOSIS — Z79899 Other long term (current) drug therapy: Secondary | ICD-10-CM | POA: Diagnosis not present

## 2024-05-26 DIAGNOSIS — E7800 Pure hypercholesterolemia, unspecified: Secondary | ICD-10-CM | POA: Diagnosis not present

## 2024-05-26 DIAGNOSIS — R7303 Prediabetes: Secondary | ICD-10-CM | POA: Diagnosis not present

## 2024-05-26 DIAGNOSIS — Z23 Encounter for immunization: Secondary | ICD-10-CM | POA: Diagnosis not present

## 2024-05-26 DIAGNOSIS — Z0000 Encounter for general adult medical examination without abnormal findings: Secondary | ICD-10-CM | POA: Diagnosis not present

## 2024-05-26 DIAGNOSIS — Z9189 Other specified personal risk factors, not elsewhere classified: Secondary | ICD-10-CM | POA: Diagnosis not present

## 2024-05-27 ENCOUNTER — Ambulatory Visit (INDEPENDENT_AMBULATORY_CARE_PROVIDER_SITE_OTHER): Admitting: Psychiatry

## 2024-05-27 DIAGNOSIS — G3184 Mild cognitive impairment, so stated: Secondary | ICD-10-CM

## 2024-05-27 DIAGNOSIS — Z636 Dependent relative needing care at home: Secondary | ICD-10-CM

## 2024-05-27 DIAGNOSIS — F411 Generalized anxiety disorder: Secondary | ICD-10-CM

## 2024-05-27 DIAGNOSIS — Z630 Problems in relationship with spouse or partner: Secondary | ICD-10-CM

## 2024-05-27 DIAGNOSIS — M48061 Spinal stenosis, lumbar region without neurogenic claudication: Secondary | ICD-10-CM

## 2024-05-27 DIAGNOSIS — F3131 Bipolar disorder, current episode depressed, mild: Secondary | ICD-10-CM

## 2024-05-27 NOTE — Progress Notes (Unsigned)
 Psychotherapy Progress Note [facility] [name], PhD LP  Patient ID: KALEP FULL)    MRN: [id number] Therapy format: Individual psychotherapy Date: 05/27/2024      Start: 2:06p     Stop: 2:56p     Time Spent: 50 min Location: In-person   Session narrative (presenting needs, interim history, self-report of stressors and symptoms, applications of prior therapy, status changes, and interventions made in session) Surprised today by the appointment not on Monday, but made it.  Cause to joke about aging.  Re [name]'s care, she got a spinal injection with Dr. Onetha, short-lived benefit, now doing regular topical anesthetic (or antiinflammatory?).   She continues to search jobs, get no callbacks, sees her discouraged to the point of letting herself cry and asking to be held, and validating what good care he takes of her.  Affirmed breakthroughs in affection, trust, and not taking out issues on each other.  Bodes well for strengthening their bond and preventing risk of acting out former spouse issues with each other.    Further breakthrough in [place] coming home for a 3-night stay.  Warmer times, no drama, all enjoyable.  Clear that both [name] and [name] have developed an affinity for each other now, which is remarkable.    Inconvenienced on the way back to [place] by late word given that [name] would be an hour late ready to receive him, but successfully made the most of it with a coffee stop stop.  The one truly disturbing thing was how the [name] mama, Char, immediately showed herself accusing and overdirective on encountering Ray again, and seeing how immediately Ray was subdued in the face of it.  Seemed plainly dismissive, unpleasable, and bordering on emotional abuse rather than therapeutic parenting of any kind.  Sufficiently moved to contact [name]'s therapist, who proved open, supportive, validating, had seen signs of this before, and is ready to seek some  retraining.  Seems to be either a cultural problem (Black family, white patient) or more a matter of a therapeutic worker losing touch with her training and letting her own issues overtake her.  The psychologist was very receptive and knowing, in an hour conversation, with confidence she will handle it discreetly.  Last phone call, sounded like Ray was medicated too early, possibly for the convenience of Char.  Otherwise, it still seems to be a beneficial placement, with [place] group and collaboration and expanding his tastes in food, especially when soul food is on the menu, as it may be for an upcoming trip to [place].  Word that Ray is well-received by extended family whenever they do travel.  Meanwhile, Ezra's 1st birthday came on the [birth year]th.  On the video call, [name] was conspicuously bubbly with [name] and avoidant of [name].  His own reaction was sanguine, as was [name]'s, even amused, considered mutual victory approaching [name] with serenity.  Probed about it, verifies both of them have made the turn past any sense of competition or need with [name].  Recalls how his mother, [name], and sister have all interpreted that [name] regrets blowing the marriage.  Baffled how they can know that, but seems to be feminine intuition about the games that might be played, along with knowledge that basically [name] tried to medicate her emotions with new sexual relationship, and it nearly always comes to ruin.  Pt clear that he is not moved by that any more, no need for resentment.  Therapeutic modalities: Cognitive Behavioral Therapy, Solution-Oriented/Positive  Psychology, Ego-Supportive, and Faith-sensitive  Mental Status/Observations:  Appearance:   Casual     Behavior:  Appropriate  Motor:  Walking without cane, stiff but fluent  Speech/Language:   Clear and Coherent  Affect:  Appropriate  Mood:  dysthymic and better  Thought process:  normal  Thought content:    WNL  Sensory/Perceptual  disturbances:    WNL  Orientation:  Fully oriented  Attention:  Good    Concentration:  Good  Memory:  grossly intact  Insight:    Fair  Judgment:   Good  Impulse Control:  Good   Risk Assessment: Danger to Self: No Self-injurious Behavior: No Danger to Others: No Physical Aggression / Violence: No Duty to Warn: No Access to Firearms a concern: No  Assessment of progress:  progressing  Diagnosis:   ICD-10-CM   1. Bipolar affective disorder, currently depressed, mild (HCC)  F31.31     2. Generalized anxiety disorder  F41.1     3. Caregiver stress  Z63.6     4. Relationship problem between partners  Z63.0    much improved    5. Mild cognitive impairment  G31.84     6. Spinal stenosis of lumbar region, unspecified whether neurogenic claudication present  M48.061      Plan:  [name]'s care -- Prioritize recruiting her to reconnect with a GI and/or hepatologist, to keep from squandering her opportunity to treat her liver condition, and stand the best chance of regaining employable status and quality of life.  Understood about tendency to defend herself against meddling or being managed, frame his concerns in terms of wanting to know his girlfriend is getting what she actually needs from management.  Defer to [facility] about whether she can work in her current state, and endorse her flexibility about which career to engage. Marital adjustment -- Continue to emphasize unambiguous support for [name] amid serious and severe health challenges.  Work through producer, television/film/video how to live together constructively, mindful that each of them can at any time be reacting to emotional learning with their exes, and often the task is to just be better than who the other had.  Watch for times when it may feel like dealing with ex, hold off jumping to defensive conclusions, and be willing to check perceptions under stress before reacting with anger, especially if treated angrily.  Prioritize allying about practical  solutions to issues and refusing to react like rivals.  Use tips for respectfully message checking with [name] and asking her to consider, at his discretion, how her expressed negativity tends to work out to get what she wants.  Welcome to join therapy ad lib. [name]'s care -- Continue to work with Medicaid, [name], and [name] as appropriate.  Stay open to positive developments, ask before assuming the worse, and use calls and visits to foster more mature working relationship.  Look to future visits with him to practice coping with transitions and attachment to two homes. Family conflict -- Self-affirm good relationship with [name], continue retiring resentments with [name], focus on what he's building with [name] and welcoming the next generation.  S about allowing news to reach [name] and subdue worry. Chronic health conditions -- Ensure adequate understanding of medical recommendations and discussion if concerned, before deciding against recommended assessment and treatment.  Don't assume self-defeating or doubtful things.  Ongoing encouragement to stretch, walk, and hydrate adequately for back health.  If suspected, reassess sleep apnea and his own CPAP therapy, as reduced sleep quality remains  a potential cause of fatigue and cognitive dulling. Bereavement, PRN -- Continue working through estate tasks, if any remain, and reminders of M.  Where needed, notice and forgive [name].  Make free use of talking to her imaginally, if it will get things off his head or heart.  Where needed imagine her own reassurance that she is OK now, nothing owed to her now.  Refresh promises of faith for himself, as he did for her in life.  Griefshare program available if interest and time. Support system -- Maintain personal support with [name] and any available friends.   Other recommendations/advice -- As may be noted above.  Continue to utilize previously learned skills ad lib. Medication compliance --  Maintain medication as prescribed and work faithfully with relevant prescriber(s) if any changes are desired or seem indicated. Crisis service -- Aware of call list and work-in appts.  Call the clinic on-call service, 988/hotline, 911, or present to [facility] or ER if any life-threatening psychiatric crisis. Followup -- Return for time as available, recommend sched ahead.  Next scheduled visit with me Visit date not found.  Next scheduled in this office 06/01/2024.  Lamar Kendall, PhD [name], PhD LP Clinical Psychologist, [facility] [address], [address] [place], [place] [address] [phone]

## 2024-06-01 ENCOUNTER — Encounter: Payer: Self-pay | Admitting: Psychiatry

## 2024-06-01 ENCOUNTER — Ambulatory Visit (INDEPENDENT_AMBULATORY_CARE_PROVIDER_SITE_OTHER): Admitting: Psychiatry

## 2024-06-01 DIAGNOSIS — F3131 Bipolar disorder, current episode depressed, mild: Secondary | ICD-10-CM

## 2024-06-01 DIAGNOSIS — G4730 Sleep apnea, unspecified: Secondary | ICD-10-CM

## 2024-06-01 DIAGNOSIS — F5105 Insomnia due to other mental disorder: Secondary | ICD-10-CM | POA: Diagnosis not present

## 2024-06-01 DIAGNOSIS — F411 Generalized anxiety disorder: Secondary | ICD-10-CM | POA: Diagnosis not present

## 2024-06-01 DIAGNOSIS — F3162 Bipolar disorder, current episode mixed, moderate: Secondary | ICD-10-CM

## 2024-06-01 DIAGNOSIS — G3184 Mild cognitive impairment, so stated: Secondary | ICD-10-CM | POA: Diagnosis not present

## 2024-06-01 DIAGNOSIS — G4710 Hypersomnia, unspecified: Secondary | ICD-10-CM

## 2024-06-01 MED ORDER — CLONIDINE HCL 0.1 MG PO TABS: ORAL_TABLET | ORAL | 1 refills | Status: DC

## 2024-06-01 MED ORDER — LORAZEPAM 1 MG PO TABS: 1.0000 mg | ORAL_TABLET | Freq: Three times a day (TID) | ORAL | 3 refills | Status: DC | PRN

## 2024-06-01 MED ORDER — QUETIAPINE FUMARATE 300 MG PO TABS: 300.0000 mg | ORAL_TABLET | Freq: Every day | ORAL | 3 refills | Status: DC

## 2024-06-01 MED ORDER — OXCARBAZEPINE 600 MG PO TABS: ORAL_TABLET | ORAL | 1 refills | Status: DC

## 2024-06-01 MED ORDER — LAMOTRIGINE 100 MG PO TABS: ORAL_TABLET | ORAL | 5 refills | Status: DC

## 2024-06-01 NOTE — Progress Notes (Signed)
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     Social History   Socioeconomic History   Marital status: Divorced    Spouse name: [name]   Number of children: 2   Years of education: 12   Highest education level: Not on file  Occupational History   Occupation: network engineer / Nutritional Therapist: [employer]  Tobacco Use   Smoking status: Never   Smokeless tobacco: Never  Vaping Use   Vaping status: Never Used  Substance and Sexual Activity   Alcohol use: Not Currently    Comment: none in 31 yrs(hx. ETOH abuse)   Drug use: Not Currently    Types: Cocaine, Heroin, Marijuana    Comment: No use in 27 yrs.   Sexual activity: Not Currently    Partners: Female  Other Topics Concern   Not on file  Social History Narrative   Patient lives at home with his wife Giles). Patient self employed.   Both handed.   Caffeine- One cup daily   Social Drivers of Health   Financial Resource Strain: Medium Risk (05/23/2024)   Received from [facility]   Overall Financial Resource Strain (CARDIA)    How hard is it for you to pay for the very basics like food, housing, medical care, and heating?: Somewhat hard  Food Insecurity: Patient Declined (05/23/2024)   Received from [facility]  Hunger Vital Sign    Within the past  12 months, you worried that your food would run out before you got the money to buy more.: Patient declined    Within the past 12 months, the food you bought just didn't last and you didn't have money to get more.: Patient declined  Transportation Needs: No Transportation Needs (05/23/2024)   Received from [facility] - Transportation    In the past 12 months, has lack of transportation kept you from medical appointments or from getting medications?: No    In the past 12 months, has lack of transportation kept you from meetings, work, or from getting things needed for daily living?: No  Physical Activity: Inactive (05/23/2024)   Received from [facility]   Exercise Vital Sign    On average, how many days per week do you engage in moderate to strenuous exercise (like a brisk walk)?: 0 days    Minutes of Exercise per Session: Not on file  Stress: Stress Concern Present (05/23/2024)   Received from [facility] - Occupational Stress Questionnaire    Do you feel stress - tense, restless, nervous, or anxious, or unable to sleep at night because your mind is troubled all the time - these days?: Very much  Social Connections: Socially Isolated (05/23/2024)   Received from [facility]   Social Network    How would you rate your social network (family, work, friends)?: Little participation, lonely and socially isolated  Intimate Partner Violence: Not At Risk (05/23/2024)   Received from [facility]   HITS    Over the last 12 months how often did your partner physically hurt you?: Never    Over the last 12 months how often did your partner insult you or talk down to you?: Rarely    Over the last 12 months how often did your partner threaten you with physical harm?: Never    Over the last 12 months how often did your partner scream or curse at you?: Never    Past Medical History, Surgical history, Social history, and Family history were  reviewed and updated as appropriate.  Son should be in group home: taking Seroquel , Depakote and Zoloft  50.  Divorced. Still goes for exercise.   Latest sleep study negative for OSA but some years ago.  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  Please see review of systems for further details on the patient's review from today.   Objective:   Physical Exam:  There were no vitals taken for this visit.  Physical Exam Constitutional:      General: He is not in acute distress.    Appearance: He is well-developed. He is obese.  Musculoskeletal:        General: No deformity.  Neurological:     Mental Status: He is alert and oriented to person, place, and time.     Cranial Nerves: No dysarthria.     Coordination: Coordination abnormal.     Comments: Using cane  Psychiatric:        Attention and Perception: Attention and perception normal. He does not perceive auditory or visual hallucinations.        Mood and Affect: Mood is anxious. Mood is not depressed. Affect is not labile or angry.        Speech: Speech normal.        Behavior: Behavior normal. Behavior is not agitated, slowed or aggressive. Behavior is  cooperative.        Thought Content: Thought content is not paranoid or delusional. Thought content does not include homicidal or suicidal ideation. Thought content does not include suicidal plan.        Cognition and Memory: Cognition and memory normal.        Judgment: Judgment normal.     Comments: insight fair. Anger under control  ongoing distress and anxious with stressors of wife's  health px.         Lab Review:     Component Value Date/Time   NA 133 (L) 07/31/2023 1337   K 4.6 07/31/2023 1337   CL 95 (L) 07/31/2023 1337   CO2 30 07/31/2023 1337   GLUCOSE 121 (H) 07/31/2023 1337   BUN 18 07/31/2023 1337   CREATININE 1.11 07/31/2023 1337   CALCIUM  9.2 07/31/2023 1337   PROT 6.4 03/18/2023 0945   ALBUMIN  4.4 06/26/2016 1521   AST 20 06/26/2016 1521    ALT 17 06/26/2016 1521   ALKPHOS 59 06/26/2016 1521   BILITOT 0.7 06/26/2016 1521   GFRNONAA >60 07/31/2023 1337   GFRAA >60 06/26/2016 1521       Component Value Date/Time   WBC 5.1 07/31/2023 1337   RBC 5.44 07/31/2023 1337   HGB 16.2 07/31/2023 1337   HCT 47.7 07/31/2023 1337   PLT 230 07/31/2023 1337   MCV 87.7 07/31/2023 1337   MCH 29.8 07/31/2023 1337   MCHC 34.0 07/31/2023 1337   RDW 12.9 07/31/2023 1337   LYMPHSABS 0.6 (L) 06/26/2016 1521   MONOABS 0.6 06/26/2016 1521   EOSABS 0.0 06/26/2016 1521   BASOSABS 0.0 06/26/2016 1521    No results found for: POCLITH, LITHIUM   No results found for: PHENYTOIN, PHENOBARB, VALPROATE, CBMZ   03/18/23 B12 low 215 ([phone])  .res Assessment: Plan:    Bipolar affective disorder, currently depressed, mild (HCC)  Generalized anxiety disorder  Mild cognitive impairment  Insomnia due to mental condition  Sleep apnea with hypersomnolence   30 min face to face time with patient.  We discussed Chronic depression, anxiety, insomnia, stress is ongoing and worse lately. Has never achieved freedom from symptoms.  No significant anger outbursts currently.  No unusual mood swings. Chronic severe stress dealing with severely mentally ill violent son and recent death of mother.  But much better affect and mood with recent positive changes.  We discussed his high dosages and polypharmacy that are medically necessary.  Disc SE risks esp sedation. Anger is better.   Stress with exW is better..  We discussed the short-term risks associated with benzodiazepines including sedation and increased fall risk among others.  Discussed long-term side effect risk including dependence, potential withdrawal symptoms, and the potential eventual dose-related risk of dementia.  But recent studies from 2020 dispute this association between benzodiazepines and dementia risk. Newer studies in 2020 do not support an association with dementia.  Disc  in detail concerns about tolerance which may be developing. No SE  Disc sleep hygiene and light interfering with sleep.    Discussed potential metabolic side effects associated with atypical antipsychotics, as well as potential risk for movement side effects. Advised pt to contact office if movement side effects occur.  Disc risk sedation with current med regimen.  Consider Auvelity for chronic depression with different mechanism.  Defer a bit longer.  Discussed the polypharmacy which is not ideal but necessary.  He is taking .  DT stress can't go down further with meds.  Disc  SE.  Lorazepam  doesn't make him sleepy  Change insurance to [employer] mail service except lorazepam . Lamotrigine  100 TID and 200 HS for bipolar depression,  lorazepam  for anxiety 1 mg TID  oxcarbazepine  300 mg tablets 2 every morning and 4 nightly for bipolar disorder, switch to 600 mg tabs for cost clonidine  0.2 mg BID for  off label for anxiety and mixed bipolar sx and should also help htn noted.,   SE hangover in AM still.  Willing to retry lower dose Seroquel  again.  Reduce Seroquel  300 mg  to see if hangover better.  Disc risk relapse mood swings or dep.  Not taking B12: Needs to start B12 for low level 215.  This might help energy, strength , and cognition.   Hx low sodium too in 2017 07/31/23 sodium 133 Disc importance  of B12  For cognition:  L-arginine supplement (NAC) N-Acetylcysteine 2 of the  600 mg capsules daily to help with mild cognitive problems.   B12 supplement   No othe rmed changes  Disc ED and meds for it.  Disc GoodRX.  Sildenafil  100 tolerated prn.  FU 2-3 mos for support  Lorene Macintosh MD, DFAPA  Please see After Visit Summary for patient specific instructions.  Future Appointments  Date Time Provider Department Center  06/06/2024  3:00 PM Marijean Charleston, PhD [facility] None  06/15/2024  8:00 AM Marijean Charleston, PhD [facility] None  07/25/2024  4:00 PM Marijean Charleston, PhD [facility] None   08/08/2024  4:00 PM Marijean Charleston, PhD CP-[facility] None  08/22/2024  4:00 PM Marijean Charleston, PhD CP-[facility] None  09/05/2024  4:00 PM Marijean Charleston, PhD CP[facility] None  09/19/2024  4:00 PM Mitchum, Charleston, PhD CP-CP None    No orders of the defined types were placed in this encounter.      -------------------------------

## 2024-06-01 NOTE — Patient Instructions (Signed)
 L-arginine supplement (NAC) N-Acetylcysteine 2 of the  600 mg capsules daily to help with mild cognitive problems.   B12 supplement

## 2024-06-05 ENCOUNTER — Other Ambulatory Visit: Payer: Self-pay | Admitting: Psychiatry

## 2024-06-05 DIAGNOSIS — F411 Generalized anxiety disorder: Secondary | ICD-10-CM

## 2024-06-06 ENCOUNTER — Ambulatory Visit (INDEPENDENT_AMBULATORY_CARE_PROVIDER_SITE_OTHER): Admitting: Psychiatry

## 2024-06-06 DIAGNOSIS — G4710 Hypersomnia, unspecified: Secondary | ICD-10-CM | POA: Diagnosis not present

## 2024-06-06 DIAGNOSIS — G4730 Sleep apnea, unspecified: Secondary | ICD-10-CM

## 2024-06-06 DIAGNOSIS — F411 Generalized anxiety disorder: Secondary | ICD-10-CM | POA: Diagnosis not present

## 2024-06-06 DIAGNOSIS — F3131 Bipolar disorder, current episode depressed, mild: Secondary | ICD-10-CM

## 2024-06-06 DIAGNOSIS — Z636 Dependent relative needing care at home: Secondary | ICD-10-CM

## 2024-06-06 NOTE — Progress Notes (Unsigned)
 Psychotherapy Progress Note [facility] [name], PhD LP  Patient ID: Johnny [name])    MRN: [id number] Therapy format: Individual psychotherapy Date: 06/06/2024      Start: 3:14p     Stop: 4:13p     Time Spent: 59 min Location: In-person   Session narrative (presenting needs, interim history, self-report of stressors and symptoms, applications of prior therapy, status changes, and interventions made in session) Quiet Thanksgiving with [name], who was not able to enjoy that much of the feast but cooked.  Confirmed she is on for GI appt this week, with f/u with new hepatologist 12/18.  Fighting fear and dread for knowing how sick she is and not knowing how it's actually going.  She had CT recently, may not know the answer yet.  Meanwhile, she's busy online applying for jobs, 20+ yesterday, but the emotional rollercoasters of not knowing, and of her stomach acting up and her getting up early & sleeping late, have continued to drain him.  She also has an unfinished SSD application, sensitive to being goaded to finish it.  Enjoyable Friendsgiving-style gathering at [facility] on Sat that included hearing from a neighbor who volunteered how she used to be friends with [name].  [name], for her part was more averse to the possibility of running into Rupert (who lives across field from [place]).  Continued encouragement that both of them can increasingly think of her as scenery.  Revealed in the course of it how [name]'s dad GW, and [name] A Roselind, both had strokes Friday night.  Roselind is in [place], [place] , so not readily available, but on a ventilator.  She'd been battling recurrent pneumonia, and predicted to be dying soon.  Poignant to be losing her, as she has been his 2nd mom.  Actively in touch with cousin Deane, a complicated breast cancer survivor, sending scripture and encouragement.  U John, M's B, already passed, of aggressive glioblastoma.  Been a series of deaths starting with CHRISTELLA Martinez, rich reminders of mortality.    Support/validation provided, and encouraged working through arrangements for and with [name], to include finishing the SSD application as positively as possible (basically, she succeeds as an earner the sooner she gets herself through it, and if she needs hands on help doing the application, it would help [name] to know he's doing something, not just risking being a nag).  Probed any resurgence of resentment or feeling of threat concerning [name], does seem to be a returning aversion and grudging.  Encouraged to rehearse what he knew before Thanksgiving, that she has no power they don't give her, neither of them has any actual need with her, and [name] is strong enough and focused enough with other things for [name] to be basically stay irrelevant any more.  Therapeutic modalities: Cognitive Behavioral Therapy, Solution-Oriented/Positive Psychology, and Assertiveness/Communication  Mental Status/Observations:  Appearance:   Casual     Behavior:  Appropriate  Motor:  Normal and slowed  Speech/Language:   Clear and Coherent  Affect:  Appropriate  Mood:  wearied  Thought process:  normal  Thought content:    WNL  Sensory/Perceptual disturbances:    WNL  Orientation:  Fully oriented  Attention:  Good    Concentration:  Good  Memory:  WNL  Insight:    Good  Judgment:   Fair  Impulse Control:  Good   Risk Assessment: Danger to Self: No Self-injurious Behavior: No Danger to Others: No Physical Aggression / Violence: No Duty to  Warn: No Access to Firearms a concern: No  Assessment of progress:  stabilized  Diagnosis:   ICD-10-CM   1. Bipolar affective disorder, currently depressed, mild (HCC)  F31.31     2. Generalized anxiety disorder  F41.1     3. Caregiver stress  Z63.6     4. Sleep apnea with hypersomnolence  G47.10    G47.30      Plan:  [name]'s care -- Prioritize recruiting her to reconnect with a GI and/or hepatologist, to keep from  squandering her opportunity to treat her liver condition, and stand the best chance of regaining employable status and quality of life.  Understood about tendency to defend herself against meddling or being managed, frame his concerns in terms of wanting to know his girlfriend is getting what she actually needs from management.  Defer to [facility] about whether she can work in her current state, and endorse her flexibility about which career to engage. Marital adjustment -- Continue to emphasize unambiguous support for [name] amid serious and severe health challenges.  Work through producer, television/film/video how to live together constructively, mindful that each of them can at any time be reacting to emotional learning with their exes, and often the task is to just be better than who the other had.  Watch for times when it may feel like dealing with ex, hold off jumping to defensive conclusions, and be willing to check perceptions under stress before reacting with anger, especially if treated angrily.  Prioritize allying about practical solutions to issues and refusing to react like rivals.  Use tips for respectfully message checking with [name] and asking her to consider, at his discretion, how her expressed negativity tends to work out to get what she wants.  Welcome to join therapy ad lib. [name]'s care -- Continue to work with Medicaid, [name], and [name] as appropriate.  Stay open to positive developments, ask before assuming the worse, and use calls and visits to foster more mature working relationship.  Look to future visits with him to practice coping with transitions and attachment to two homes. Family conflict -- Self-affirm good relationship with [name], continue retiring resentments with [name], focus on what he's building with [name] and welcoming the next generation.  S about allowing news to reach [name] and subdue worry. Chronic health conditions -- Ensure adequate understanding of medical recommendations and  discussion if concerned, before deciding against recommended assessment and treatment.  Don't assume self-defeating or doubtful things.  Ongoing encouragement to stretch, walk, and hydrate adequately for back health.  If suspected, reassess sleep apnea and his own CPAP therapy, as reduced sleep quality remains a potential cause of fatigue and cognitive dulling. Bereavement, PRN -- Continue working through estate tasks, if any remain, and reminders of M.  Where needed, notice and forgive [name].  Make free use of talking to her imaginally, if it will get things off his head or heart.  Where needed imagine her own reassurance that she is OK now, nothing owed to her now.  Refresh promises of faith for himself, as he did for her in life.  Griefshare program available if interest and time. Support system -- Maintain personal support with [name] and any available friends.   Other recommendations/advice -- As may be noted above.  Continue to utilize previously learned skills ad lib. Medication compliance -- Maintain medication as prescribed and work faithfully with relevant prescriber(s) if any changes are desired or seem indicated. Crisis service -- Aware of  call list and work-in appts.  Call the clinic on-call service, 988/hotline, 911, or present to [facility] or ER if any life-threatening psychiatric crisis. Followup -- Return for time as already scheduled.  Next scheduled visit with me 06/15/2024.  Next scheduled in this office 06/15/2024.  Lamar Kendall, PhD [name], PhD LP Clinical Psychologist, [facility] [address], [address] [place], [place] [address] [phone]

## 2024-06-08 ENCOUNTER — Telehealth: Payer: Self-pay | Admitting: Psychiatry

## 2024-06-08 NOTE — Telephone Encounter (Signed)
 Johnny [name] called to say that his cost for the oxcarbezepine 600mg  will be over $100 for 90 day supply. He can't afford that and wants to just get a 30 day supply. He will talk to the pharmacy at [facility] and see if they will fill 30 days for him. He will call back if we need to do anything.

## 2024-06-15 ENCOUNTER — Ambulatory Visit: Admitting: Psychiatry

## 2024-07-04 ENCOUNTER — Ambulatory Visit (INDEPENDENT_AMBULATORY_CARE_PROVIDER_SITE_OTHER): Admitting: Psychiatry

## 2024-07-04 DIAGNOSIS — G3184 Mild cognitive impairment, so stated: Secondary | ICD-10-CM

## 2024-07-04 DIAGNOSIS — F3131 Bipolar disorder, current episode depressed, mild: Secondary | ICD-10-CM

## 2024-07-04 DIAGNOSIS — Z636 Dependent relative needing care at home: Secondary | ICD-10-CM

## 2024-07-04 DIAGNOSIS — F411 Generalized anxiety disorder: Secondary | ICD-10-CM

## 2024-07-04 NOTE — Progress Notes (Unsigned)
 Psychotherapy Progress Note [facility] [name], PhD LP  Patient ID: Johnny [name])    MRN: [id number] Therapy format: Individual psychotherapy Date: 07/04/2024      Start: 10:11a     Stop: 11:14a     Time Spent: 63 min Location: In-person   Session narrative (presenting needs, interim history, self-report of stressors and symptoms, applications of prior therapy, status changes, and interventions made in session) Unusual morning appt time.  Cane free on greeting, some lumbering gait for pain and knee recovery, and a bit slower mentally, with notable sighing on starting the session.  Says Christmas came and went, not the happiest.  Never put up trees, has three of them in boxes in his truck that never got put up and got rained on.  Just too fatigued and demoralized to put them up.  Ray did make it up, which was good, but it's still the first year [name] felt uncaring about Christmas, largely for recent grief (A Lois died 16-Jun-2025) and illness ([name]'s ongoing condition).    More particularly, sees [name] losing it more now -- more sleeping, little progress job finding, less stamina, less hope of finding work.  Sounds like depression of her own, and his instinct is to urge her to get herself back on schedule for counselor and psychiatrist in this office.  Details, however, sound much more serious and organic in nature, consistent with advancing liver disease.  Possible also that being on Ozempic either has her in more of a metabolic crisis, or is stressing her impaired liver further, and despite successfully losing weight, she may be advancing her liver condition unknowingly.  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 I ... I ... unable to connect to her intentions until it begins to irritate [name] trying to get what she can't say, eventually  naming how she wants to eat, a cheeseburger.  Also more oddly pointing to a doorknob and saying red ... red ... While getting frustrated with her own mind for not working to communicate.  She will have some headaches, and is prone to take aspirin, with caffeine, to treat them.  Repeated episodes of aphasia, not necessarily resolved right now, and she sleeps a lot.    Has begun to wonder if she would get so frustrated that she would use her gun on him.  Advised to banish the thought, that it is previous trauma working on his imagination, and it's clearer from the description she is in now ay making threats on him, she is deeply frustrated with her own condition, and her m.o. is to withdraw, not assault, if sufficiently angered.  Discussed at length his own frustration -- how she got miffed with him for not understanding what she could not say, how he started reading in unfair judgment, and how her increasing needs are pressuring his own need to work to make ends meet.  Another thing I have to do for her.  Made clear, repeatedly, that she sounds like she is bordering on emergency medicine, quite possibly in more acute liver failure that would require hospitalization or dialysis.  She needs prompt medical assessment, after a year or more of putting off treatment, and she needs full disclosure to a competent physician that she has active liver disease and is showing cognitive problems that strongly suggest hepatic encephalopathy or  something related, and there may be a narrowing window of time to treat her before she sustains permanent damage.  She has no business trying to pursue work at this moment, and he should try to convince her to get checked out for toxicity capable of neurological damage before anything else.  Understanding that he needs to work, too, but I could not in good conscience let him convince himself it's fine to wait when it might not be, and he could be setting up for big regrets and things  he can't take back, alluding to how he kept his mom in such a sick situation it bordered on reportable neglect, something he realized after she tragically died in his own home.  If he can prevent repeat horrors by peeling off and doing this, he would thank himself later.  Advised also that he should not be trusting [name] in this state to manage her own treatment calendar and self-care instructions but make sure he is caught up and has her appointments on his own calendar.    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  Once acute and emergency health care is addressed, it will be particularly important to resume applying for SSDI, because both leaving the application unfinished in frustration and vainly pursuing work she cannot sustain is actually turning into a cruel self-injury, and no amount of pride on either of their parts should prevent them from making the employment insurance claim she is obviously entitled to.  Reluctantly agrees to take up the priorities, and accepts offer to pass along word to [name]'s providers here what state she is in and what is being recommended.  Therapeutic modalities: Cognitive Behavioral Therapy, Solution-Oriented/Positive Psychology, Psycho-education/Bibliotherapy, and directive  Mental Status/Observations:  Appearance:   Casual     Behavior:  Resistant and demotivated  Motor:  Slowed, but no cane  Speech/Language:   Clear and Coherent and Slow  Affect:  Appropriate  Mood:  Weary, dysthymic  Thought process:  concrete  Thought content:    Worry, some grudging  Sensory/Perceptual disturbances:    WNL  Orientation:  Fully oriented  Attention:  Fair    Concentration:  Good  Memory:  Mildly impaired STM  Insight:    Fair  Judgment:   Fair  Impulse Control:  Good   Risk Assessment: Danger to Self: No Self-injurious Behavior: No Danger to Others: No Physical Aggression / Violence: No Duty  to Warn: No Access to Firearms a concern: No  Assessment of progress:  situational setback(s), both in stress and in judgment  Diagnosis:   ICD-10-CM   1. Bipolar affective disorder, currently depressed, mild (HCC)  F31.31     2. Generalized anxiety disorder  F41.1     3. Caregiver stress  Z63.6     4. Mild cognitive impairment  G31.84      Plan:  [name]'s care -- Prioritize all appropriate care for her liver condition, including being current with a GI and/or hepatologist, and possibly urgent action to address signs of organ failure which she and [name] may both be ignorant of.  Subordinate the goal of finding work to the goal of adequate health to prevent cognitive disability and failure to remain awake for work.  Understood about tendency to defend herself against meddling or being managed, frame his concerns in terms of wanting to know his girlfriend is getting what she actually needs from management.  When she is  more clearly able to work, defer to [employer] about which career to engage. Marital adjustment -- Continue to emphasize unambiguous support for [name] amid serious and severe health challenges.  Work through producer, television/film/video how to live together constructively, mindful that each of them can at any time be reacting to emotional learning with their exes, and often the task is to just be better than who the other had.  Watch for times when it may feel like dealing with ex, hold off jumping to defensive conclusions, and be willing to check perceptions under stress before reacting with anger, especially if treated angrily.  Prioritize allying about practical solutions to issues and refusing to react like rivals.  Use tips for respectfully message checking with [name] and asking her to consider, at his discretion, how her expressed negativity tends to work out to get what she wants.  Welcome to join therapy ad lib. [name]'s care -- Continue to work with Medicaid, [name], and [name] as  appropriate.  Stay open to positive developments, ask before assuming the worse, and use calls and visits to foster more mature working relationship.  Look to future visits with him to practice coping with transitions and attachment to two homes. Family conflict -- Self-affirm good relationship with [name], continue retiring resentments with [name], focus on what he's building with [name] and welcoming the next generation.  S about allowing news to reach [name] and subdue worry. Chronic health conditions -- Ensure adequate understanding of medical recommendations and discussion if concerned, before deciding against recommended assessment and treatment.  Don't assume self-defeating or doubtful things.  Ongoing encouragement to stretch, walk, and hydrate adequately for back health.  If suspected, reassess sleep apnea and his own CPAP therapy, as reduced sleep quality remains a potential cause of fatigue and cognitive dulling. Bereavement, PRN -- Continue working through estate tasks, if any remain, and reminders of M.  Where needed, notice and forgive [name].  Make free use of talking to her imaginally, if it will get things off his head or heart.  Where needed imagine her own reassurance that she is OK now, nothing owed to her now.  Refresh promises of faith for himself, as he did for her in life.  Griefshare program available if interest and time. Support system -- Maintain personal support with [name] and any available friends.   Other recommendations/advice -- As may be noted above.  Continue to utilize previously learned skills ad lib. Medication compliance -- Maintain medication as prescribed and work faithfully with relevant prescriber(s) if any changes are desired or seem indicated. Crisis service -- Aware of call list and work-in appts.  Call the clinic on-call service, 988/hotline, 911, or present to [facility] or ER if any life-threatening psychiatric crisis. Followup -- Return for time as  already scheduled, avail earlier @ PT's need.  Next scheduled visit with me 07/25/2024.  Next scheduled in this office 07/25/2024.  Lamar Kendall, PhD [name], PhD LP Clinical Psychologist, [facility] [address], [address] [place], [place] [address] [phone]

## 2024-07-08 ENCOUNTER — Other Ambulatory Visit: Payer: Self-pay | Admitting: Psychiatry

## 2024-07-08 DIAGNOSIS — F3162 Bipolar disorder, current episode mixed, moderate: Secondary | ICD-10-CM

## 2024-07-08 NOTE — Telephone Encounter (Signed)
 Reduced dose to 300 mg HS

## 2024-07-11 ENCOUNTER — Ambulatory Visit (INDEPENDENT_AMBULATORY_CARE_PROVIDER_SITE_OTHER): Admitting: Psychiatry

## 2024-07-11 DIAGNOSIS — G3184 Mild cognitive impairment, so stated: Secondary | ICD-10-CM

## 2024-07-11 DIAGNOSIS — F5104 Psychophysiologic insomnia: Secondary | ICD-10-CM

## 2024-07-11 DIAGNOSIS — Z630 Problems in relationship with spouse or partner: Secondary | ICD-10-CM | POA: Diagnosis not present

## 2024-07-11 DIAGNOSIS — F411 Generalized anxiety disorder: Secondary | ICD-10-CM | POA: Diagnosis not present

## 2024-07-11 DIAGNOSIS — Z636 Dependent relative needing care at home: Secondary | ICD-10-CM | POA: Diagnosis not present

## 2024-07-11 DIAGNOSIS — F3131 Bipolar disorder, current episode depressed, mild: Secondary | ICD-10-CM | POA: Diagnosis not present

## 2024-07-11 NOTE — Progress Notes (Signed)
 Psychotherapy Progress Note [facility] Johnny Kendall, PhD LP  Patient ID: Johnny [name])    MRN: [id number] Therapy format: Individual psychotherapy Date: 07/11/2024      Start: 11:15a     Stop: 12:05p     Time Spent: 50 min Location: In-person   Session narrative (presenting needs, interim history, self-report of stressors and symptoms, applications of prior therapy, status changes, and interventions made in session) Took the printout from last time and gave it to Johnny [name] to read and figure, stood by while she looked up things at his prompting, then let her decide not to go see a doctor.  Supportively challenged again that her condition is risky and could turn more urgent, agrees to try her again but admits anxiety risking her wrath.  Agrees on review that she is considerably more fatigued than she was, in no way is she liable to repeat the pain Johnny [name] did, and he has an affirmative defense urging her in that he cares too much to stand by while she neglects what she actually needs.  Says she seems to be merging him sometimes with Johnny [name], her ex, and still mainly focusing on getting a job, or alternatively moving to [place] to be supported by her sister.  Neither one realistic, and not clear why she would think she needs to separate and take up with family when he has pledged in sickness and in health, for richer for poorer.  She is still having trouble completing sentences, but asking for her resume.  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  Says for himself here that he's doing everything that needs doing around the house.  New discoveries with mail she hasn't opened, including a bank letter received this weekend stating that she is delinquent on a credit card (just $52 worth).  More significantly,  found 134,000 unread emails, and then she was unable to find her resume in the evening, then last night was up throwing up.  She did have a more vulnerable moment crying on his shoulder that it's her fault; astutely told her no, she's sick, that's the issue, and they need to do something about that.  She wanted to come today but was too fatigued, couldn't wake up.  Support/validation provided.   Advised again priority to get sound medical advice and assessment for her, ASAP.  Resolved to try to contact her hepatologist Johnny [name]) today, and failing that her PCP (Johnny [name]), for medical guidance, and to specifically say another doctor has heard her symptoms and suspects blood toxicity, if not hepatic encephalopathy.  Urged to be willing to go even if it means a long ED wait, and willing to say I thought it was that serious.  Therapeutic modalities: Cognitive Behavioral Therapy, Solution-Oriented/Positive Psychology, Ego-Supportive, and Psycho-education/Bibliotherapy  Mental Status/Observations:  Appearance:   Casual     Behavior:  Appropriate and weary  Motor:  Normal  Speech/Language:   Clear and Coherent and slowed  Affect:  Appropriate  Mood:  depressed  Thought process:  normal  Thought content:    Rumination  Sensory/Perceptual disturbances:    WNL  Orientation:  Fully oriented  Attention:  Fair    Concentration:  Good  Memory:  grossly intact  Insight:    Good  Judgment:  Fair  Impulse Control:  Good   Risk Assessment: Danger to Self: No Self-injurious Behavior: No Danger to Others: No Physical Aggression / Violence: No Duty to Warn: No Access to Firearms a concern: No  Assessment of progress:  stabilized  Diagnosis:   ICD-10-CM   1. Bipolar affective disorder, currently depressed, mild (HCC)  F31.31     2. Generalized anxiety disorder  F41.1     3. Caregiver stress  Z63.6     4. Mild cognitive impairment (mixed etiology)  G31.84     5. Psychophysiological insomnia   F51.04     6. Relationship problem between partners  Z63.0      Plan:  Johnny [name] care -- Prioritize all appropriate care for her liver condition, including being current with a GI and/or hepatologist, and possibly urgent care or ED to address signs of organ failure and possible encephalopathy, which she and Johnny [name] are liable to be ignorant of.  Recommend suspending all efforts and urging to job-finding as nursing illusions and denial during a serious health condition, and prioritize preventing her incurring cognitive disability and running down time for suspected need of transplant.  Understood about tendency to defend herself against meddling or being managed, and the need to prevent herself being bullied again; encourage Johnny [name] frame his concerns in terms of wanting to know his girlfriend is getting what she actually needs and that he loves her too much to stand by while she risks her health and their future.  Once she is more clearly able to work, OK to engage supportively about which of her career and skills to offer. Marital adjustment -- Continue to emphasize unambiguous support for Johnny [name] amid serious and severe health challenges.  Work through producer, television/film/video how to live together constructively, mindful that each of them can at any time be reacting to emotional learning with their exes.  Often the task is to just be better than who the other had.  Watch for times when it may feel like dealing with ex, hold off jumping to defensive conclusions, and be willing to check perceptions under stress before reacting with anger, especially if treated angrily.  Prioritize allying about practical solutions to issues and refusing to react like rivals.  Use tips for respectfully message checking with Johnny [name] and asking her to consider, at his discretion, how her expressed negativity tends to work out to get what she wants.  Welcome to join therapy ad lib. [name]'s care -- Continue to work with Medicaid, [name], and the  [name] as appropriate.  Stay open to positive developments, ask before assuming the worse, and use calls and visits to foster more mature working relationship.  Look to future visits with him to practice coping with transitions and attachment to two homes. Family conflict -- Self-affirm good relationship with [name].  Continue retiring resentments about Johnny [name], and make short work of worry about allowing news to reach her.  .  Focus on what he's building with Johnny [name] and welcoming the next generation.   Chronic health conditions -- Ensure adequate understanding of medical recommendations and discussion if concerned, before deciding against recommended assessment and treatment.  Don't assume self-defeating or doubtful things.  Ongoing encouragement to stretch, walk, and hydrate adequately for back recovery and general health.  If suspected, reassess sleep apnea and his own CPAP therapy, as reduced sleep quality remains a potentially strong cause of fatigue and cognitive dulling, in addition to concussion and drug histories and obesity. Bereavement, PRN -- Continue working through estate  tasks, if any remain, and reminders of M.  Where needed, notice and forgive [name].  Make free use of talking to her imaginally, if it will get things off his head or heart.  Where needed imagine her own reassurance that she is OK now, nothing owed to her now.  Refresh promises of faith for himself, as he did for her in life.  Griefshare program available if interest and time. Support system -- Maintain personal support with Johnny [name] and any available friends.   Other recommendations/advice -- As may be noted above.  Continue to utilize previously learned skills ad lib. Medication compliance -- Maintain medication as prescribed and work faithfully with relevant prescriber(s) if any changes are desired or seem indicated. Crisis service -- Aware of call list and work-in appts.  Call the clinic on-call service,  988/hotline, 911, or present to [facility] or ER if any life-threatening psychiatric crisis. Followup -- Return for time as already scheduled.  Next scheduled visit with me 07/25/2024.  Next scheduled in this office 07/25/2024.  Lamar Kendall, PhD Johnny Kendall, PhD LP Clinical Psychologist, [facility] [address] [place], [place] [address] [phone]

## 2024-07-25 ENCOUNTER — Ambulatory Visit (INDEPENDENT_AMBULATORY_CARE_PROVIDER_SITE_OTHER): Admitting: Psychiatry

## 2024-07-25 DIAGNOSIS — G3184 Mild cognitive impairment, so stated: Secondary | ICD-10-CM

## 2024-07-25 DIAGNOSIS — F411 Generalized anxiety disorder: Secondary | ICD-10-CM | POA: Diagnosis not present

## 2024-07-25 DIAGNOSIS — Z636 Dependent relative needing care at home: Secondary | ICD-10-CM | POA: Diagnosis not present

## 2024-07-25 DIAGNOSIS — F3131 Bipolar disorder, current episode depressed, mild: Secondary | ICD-10-CM

## 2024-07-25 NOTE — Progress Notes (Signed)
 Psychotherapy Progress Note [facility] [name], PhD LP  Patient ID: NITISH ROES)    MRN: [id number] Therapy format: Individual psychotherapy Date: 07/25/2024      Start: 4:13p     Stop: 5:00p     Time Spent: 47 min Location: In-person   Session narrative (presenting needs, interim history, self-report of stressors and symptoms, applications of prior therapy, status changes, and interventions made in session) [name] had a tough week last week, saw her hepatologist (1st visit), so far doesn't think it's encephalopathy, but it wasn't revealed about her having word finding and thought blocking problems.  Her stomach is quite painful, but Dr. is starting with an allergen test.  [name] has also galvanized some and put out more applications, maybe a dozen, and has been offered a 30-min phone interview.  Attributions about [name] improving include her eating a lot more vegetables and starting Miralax .  Leading possibility is 90 minutes away, in [place], [place], which introduces issues of moving, but to her credit, [name] has opened her mind to cold-calling local [name].  She's also contacted her former job about coming back, having seen it relisted after 2 others tried it out.    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  Very hopeful, after  losing regular business in the upheavals of M's illness, [name]'s behavior, and the 2 website hacks.  Reconnected with Matt ;last summer, opening up the opportunity to rebuild a web presence.  3, maybe 4 years since he was working to normal capacity, he figures ... But review of EHR shows it was 4 years ago she was already substantially demented and living alone in the [place], so really more like at least 7-8 years since he could work at capacity.  Even before that,  the progression toward divorce was compromising to his ability to work.    [name] called yesterday, good chat.     Super Bowl Sunday coming is a reminder of mom's death, on SBS 2 years ago.  A died last month, 2025/07/05.  Support/validation provided.   Therapeutic modalities: Cognitive Behavioral Therapy, Solution-Oriented/Positive Psychology, and Ego-Supportive  Mental Status/Observations:  Appearance:   Casual     Behavior:  Appropriate  Motor:  Normal  Speech/Language:   Clear and Coherent  Affect:  Appropriate  Mood:  Concerned, less intense  Thought process:  normal  Thought content:    WNL  Sensory/Perceptual disturbances:    WNL  Orientation:  Fully oriented  Attention:  Good    Concentration:  Good  Memory:  grossly intact  Insight:    Variable  Judgment:   Good  Impulse Control:  Good   Risk Assessment: Danger to Self: No Self-injurious Behavior: No Danger to Others: No Physical Aggression / Violence: No Duty to Warn: No Access to Firearms a concern: No  Assessment of progress:  progressing  Diagnosis:   ICD-10-CM   1. Bipolar affective disorder, currently depressed, mild (HCC)  F31.31     2. Generalized anxiety disorder  F41.1     3. Caregiver stress  Z63.6     4. Mild cognitive impairment (mixed etiology)  G31.84      Plan:  [name]'s care -- Prioritize all appropriate care for her liver condition, including being current with a GI and/or hepatologist, and possibly urgent  care or ED to address signs of organ failure and possible encephalopathy, which she and [name] are liable to be ignorant of.  Recommend suspending all efforts and urging to job-finding as nursing illusions and denial during a serious health condition, and prioritize preventing her incurring cognitive disability and running down time for suspected need of transplant.  Understood about tendency to defend herself against meddling or being managed, and the need to prevent herself being bullied again;  encourage [name] frame his concerns in terms of wanting to know his girlfriend is getting what she actually needs and that he loves her too much to stand by while she risks her health and their future.  Once she is more clearly able to work, OK to engage supportively about which of her career and skills to offer. Marital adjustment -- Continue to emphasize unambiguous support for [name] amid serious and severe health challenges.  Work through producer, television/film/video how to live together constructively, mindful that each of them can at any time be reacting to emotional learning with their exes.  Often the task is to just be better than who the other had.  Watch for times when it may feel like dealing with ex, hold off jumping to defensive conclusions, and be willing to check perceptions under stress before reacting with anger, especially if treated angrily.  Prioritize allying about practical solutions to issues and refusing to react like rivals.  Use tips for respectfully message checking with [name] and asking her to consider, at his discretion, how her expressed negativity tends to work out to get what she wants.  Welcome to join therapy ad lib. [name]'s care -- Continue to work with Medicaid, [name], and [name] as appropriate.  Stay open to positive developments, ask before assuming the worse, and use calls and visits to foster more mature working relationship.  Look to future visits with him to practice coping with transitions and attachment to two homes. Family conflict -- Self-affirm good relationship with [name].  Continue retiring resentments about [name], and make short work of worry about allowing news to reach her.  .  Focus on what he's building with [name] and welcoming the next generation.   Chronic health conditions -- Ensure adequate understanding of medical recommendations and discussion if concerned, before deciding against recommended assessment and treatment.  Don't assume self-defeating or doubtful  things.  Ongoing encouragement to stretch, walk, and hydrate adequately for back recovery and general health.  If suspected, reassess sleep apnea and his own CPAP therapy, as reduced sleep quality remains a potentially strong cause of fatigue and cognitive dulling, in addition to concussion and drug histories and obesity. Bereavement, PRN -- Continue working through estate tasks, if any remain, and reminders of M.  Where needed, notice and forgive [name].  Make free use of talking to her imaginally, if it will get things off his head or heart.  Where needed imagine her own reassurance that she is OK now, nothing owed to her now.  Refresh promises of faith for himself, as he did for her in life.  Griefshare program available if interest and time. Support system -- Maintain personal support with [name] and any available friends.   Other recommendations/advice -- As may be noted above.  Continue to utilize previously learned skills ad lib. Medication compliance -- Maintain medication as prescribed and work faithfully with relevant prescriber(s) if any changes are desired or seem indicated. Crisis service -- Aware of call list and work-in appts.  Call the clinic on-call service, 988/hotline, 911, or present to [facility] or ER if any life-threatening psychiatric crisis. Followup -- Return for time as already scheduled.  Next scheduled visit with me 08/08/2024.  Next scheduled in this office 07/26/2024.  Lamar Kendall, PhD [name], PhD LP Clinical Psychologist, [facility] [address] [place], [place] [address] [phone]

## 2024-07-26 ENCOUNTER — Ambulatory Visit: Admitting: Psychiatry

## 2024-07-26 ENCOUNTER — Encounter: Payer: Self-pay | Admitting: Psychiatry

## 2024-07-26 DIAGNOSIS — F411 Generalized anxiety disorder: Secondary | ICD-10-CM

## 2024-07-26 DIAGNOSIS — F3131 Bipolar disorder, current episode depressed, mild: Secondary | ICD-10-CM

## 2024-07-26 DIAGNOSIS — G3184 Mild cognitive impairment, so stated: Secondary | ICD-10-CM

## 2024-07-26 DIAGNOSIS — F3162 Bipolar disorder, current episode mixed, moderate: Secondary | ICD-10-CM | POA: Diagnosis not present

## 2024-07-26 DIAGNOSIS — F5104 Psychophysiologic insomnia: Secondary | ICD-10-CM | POA: Diagnosis not present

## 2024-07-26 MED ORDER — LAMOTRIGINE 100 MG PO TABS: ORAL_TABLET | ORAL | 1 refills | Status: AC

## 2024-07-26 MED ORDER — QUETIAPINE FUMARATE 300 MG PO TABS: 300.0000 mg | ORAL_TABLET | Freq: Every day | ORAL | 1 refills | Status: DC

## 2024-07-26 MED ORDER — OXCARBAZEPINE 600 MG PO TABS: ORAL_TABLET | ORAL | 3 refills | Status: AC

## 2024-07-26 MED ORDER — LORAZEPAM 1 MG PO TABS: 1.0000 mg | ORAL_TABLET | Freq: Three times a day (TID) | ORAL | 3 refills | Status: AC | PRN

## 2024-07-26 MED ORDER — QUETIAPINE FUMARATE 200 MG PO TABS: 200.0000 mg | ORAL_TABLET | Freq: Every day | ORAL | 2 refills | Status: AC

## 2024-07-26 MED ORDER — CLONIDINE HCL 0.1 MG PO TABS: ORAL_TABLET | ORAL | 1 refills | Status: AC
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
## 2024-08-08 ENCOUNTER — Ambulatory Visit: Admitting: Psychiatry

## 2024-08-18 ENCOUNTER — Ambulatory Visit: Admitting: Psychiatry

## 2024-08-22 ENCOUNTER — Ambulatory Visit: Admitting: Psychiatry

## 2024-09-05 ENCOUNTER — Ambulatory Visit: Admitting: Psychiatry

## 2024-09-19 ENCOUNTER — Ambulatory Visit: Admitting: Psychiatry

## 2024-09-27 ENCOUNTER — Ambulatory Visit: Admitting: Psychiatry

## 2024-10-03 ENCOUNTER — Ambulatory Visit: Admitting: Psychiatry
